# Patient Record
Sex: Male | Born: 1995 | Race: White | Hispanic: No | Marital: Single | Smoking: Never smoker
Health system: Southern US, Community
[De-identification: ages and names within clinical notes are randomized; demographics above are authoritative.]

## PROBLEM LIST (undated history)

## (undated) DIAGNOSIS — F909 Attention-deficit hyperactivity disorder, unspecified type: Secondary | ICD-10-CM

## (undated) DIAGNOSIS — E785 Hyperlipidemia, unspecified: Secondary | ICD-10-CM

## (undated) DIAGNOSIS — F913 Oppositional defiant disorder: Secondary | ICD-10-CM

## (undated) DIAGNOSIS — R079 Chest pain, unspecified: Secondary | ICD-10-CM

## (undated) HISTORY — DX: Oppositional defiant disorder: F91.3

## (undated) HISTORY — DX: Attention-deficit hyperactivity disorder, unspecified type: F90.9

## (undated) HISTORY — DX: Hyperlipidemia, unspecified: E78.5

---
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
## 2016-01-18 ENCOUNTER — Encounter: Payer: Self-pay | Admitting: Nurse Practitioner

## 2016-01-18 ENCOUNTER — Ambulatory Visit (INDEPENDENT_AMBULATORY_CARE_PROVIDER_SITE_OTHER): Payer: Medicaid Other | Admitting: Nurse Practitioner

## 2016-01-18 VITALS — BP 132/88 | HR 109 | Temp 97.1°F | Ht 66.0 in | Wt 214.0 lb

## 2016-01-18 DIAGNOSIS — F900 Attention-deficit hyperactivity disorder, predominantly inattentive type: Secondary | ICD-10-CM

## 2016-01-18 MED ORDER — METHYLPHENIDATE HCL ER (OSM) 54 MG PO TBCR: 54.0000 mg | EXTENDED_RELEASE_TABLET | ORAL | Status: DC

## 2016-01-18 NOTE — Progress Notes (Signed)
   Subjective:    Patient ID: Stephen Richardson, male    DOB: 1996-05-01, 20 y.o.   MRN: [id number]  HPI Patient brought in today by mom for follow up of ADHD. Currently taking concerta  daily. Behavior- good Grades- good Medication side effects- none Weight loss- none Sleeping habits- good- not taking clinidine nightly Any concerns- none     Review of Systems  Constitutional: Negative.   HENT: Negative.   Respiratory: Negative.   Cardiovascular: Negative.   Genitourinary: Negative.   Neurological: Negative.   Psychiatric/Behavioral: Negative.   All other systems reviewed and are negative.      Objective:   Physical Exam  Constitutional: He is oriented to person, place, and time. He appears well-developed and well-nourished.  Cardiovascular: Normal rate, regular rhythm and normal heart sounds.   Pulmonary/Chest: Effort normal and breath sounds normal.  Abdominal: Soft. Bowel sounds are normal.  Neurological: He is alert and oriented to person, place, and time.  Skin: Skin is warm.  Psychiatric: He has a normal mood and affect. His behavior is normal. Judgment and thought content normal.   BP 132/88 mmHg  Pulse 109  Temp(Src) 97.1 F (36.2 C) (Oral)  Ht  (1.676 m)  Wt 214 lb (97.07 kg)  BMI 34.56 kg/m2        Assessment & Plan:  1. ADD (attention deficit hyperactivity disorder, inattentive type) Meds as prescribed Behavior modification as needed Follow-up for recheck in 3 months - methylphenidate (CONCERTA) 54 MG PO CR tablet; Take 1 tablet (54 mg total) by mouth every morning.  Dispense: 30 tablet; Refill: 0 - methylphenidate (CONCERTA) 54 MG PO CR tablet; Take 1 tablet (54 mg total) by mouth every morning.  Dispense: 30 tablet; Refill: 0 - methylphenidate (CONCERTA) 54 MG PO CR tablet; Take 1 tablet (54 mg total) by mouth every morning.  Dispense: 30 tablet; Refill: 0   Mary-Margaret Daphine Deutscher, FNP

## 2016-02-23 ENCOUNTER — Encounter (HOSPITAL_COMMUNITY): Payer: Self-pay | Admitting: Emergency Medicine

## 2016-02-23 ENCOUNTER — Emergency Department (HOSPITAL_COMMUNITY): Payer: Medicaid Other

## 2016-02-23 ENCOUNTER — Emergency Department (HOSPITAL_COMMUNITY)
Admission: EM | Admit: 2016-02-23 | Discharge: 2016-02-24 | Disposition: A | Discharge status: Home / Self Care | Payer: Medicaid Other | Attending: Emergency Medicine | Admitting: Emergency Medicine

## 2016-02-23 DIAGNOSIS — Z79899 Other long term (current) drug therapy: Secondary | ICD-10-CM | POA: Diagnosis not present

## 2016-02-23 DIAGNOSIS — I10 Essential (primary) hypertension: Secondary | ICD-10-CM | POA: Insufficient documentation

## 2016-02-23 DIAGNOSIS — R079 Chest pain, unspecified: Secondary | ICD-10-CM

## 2016-02-23 DIAGNOSIS — R011 Cardiac murmur, unspecified: Secondary | ICD-10-CM | POA: Diagnosis not present

## 2016-02-23 DIAGNOSIS — F909 Attention-deficit hyperactivity disorder, unspecified type: Secondary | ICD-10-CM | POA: Insufficient documentation

## 2016-02-23 LAB — BASIC METABOLIC PANEL
Anion gap: 10 (ref 5–15)
BUN: 13 mg/dL (ref 6–20)
CHLORIDE: 104 mmol/L (ref 101–111)
CO2: 25 mmol/L (ref 22–32)
CREATININE: 0.58 mg/dL — AB (ref 0.61–1.24)
Calcium: 9.5 mg/dL (ref 8.9–10.3)
GFR calc Af Amer: >60 mL/min (ref >60)
GFR calc non Af Amer: >60 mL/min (ref >60)
Glucose, Bld: 91 mg/dL (ref 65–99)
POTASSIUM: 3.6 mmol/L (ref 3.5–5.1)
Sodium: 139 mmol/L (ref 135–145)

## 2016-02-23 LAB — CBC
HEMATOCRIT: 43.4 % (ref 39.0–52.0)
Hemoglobin: 14.4 g/dL (ref 13.0–17.0)
MCH: 29 pg (ref 26.0–34.0)
MCHC: 33.2 g/dL (ref 30.0–36.0)
MCV: 87.3 fL (ref 78.0–100.0)
PLATELETS: 365 10*3/uL (ref 150–400)
RBC: 4.97 MIL/uL (ref 4.22–5.81)
RDW: 13.3 % (ref 11.5–15.5)
WBC: 10.2 10*3/uL (ref 4.0–10.5)

## 2016-02-23 LAB — I-STAT TROPONIN, ED: Troponin i, poc: 0 ng/mL (ref 0.00–0.08)

## 2016-02-23 NOTE — ED Notes (Signed)
Per ems-- pt had sudden onset cp, dizziness, sob. Pt with hx of heart murmur. Pt htn with ems. Ems administered 324 asa with brought his pain down to 2/10. 20G IV in place in L AC.

## 2016-02-23 NOTE — ED Provider Notes (Signed)
CSN: [id number]     Arrival date & time 02/23/16  1704 History  By signing my name below, I, Freida Busman, attest that this documentation has been prepared under the direction and in the presence of non-physician practitioner, Melburn Hake, PA-C. Electronically Signed: Freida Busman, Scribe. 02/23/2016. 11:55 PM.    Chief Complaint  Patient presents with  . Chest Pain    The history is provided by the patient. No language interpreter was used.    HPI Comments:  Stephen Richardson is a 20 y.o. male who presents to the Emergency Department complaining of intermittent, left sided CP which began this afternoon while on the bus. He notes his pain radiates into his left shoulder. Pt describes the pain as a tightness. He reports associated  dizziness at time of onset but notes it has resolved. Pt was given 324 mg ASA via EMS with relief. He denies CP at this time. Pt reports h/o similar symptoms; reports h/o heart murmur and is currently being followed by cardiology at [facility]. Pt is not currently on meds for the CP.  He also denies fever, nausea, cough, congestion, rhinorrhea, numbness/tingling, SOB, palpitations, abdominal pain and weakness.  No recent change in ADD meds.   Past Medical History  Diagnosis Date  . ADD (attention deficit disorder with hyperactivity)   . ODD (oppositional defiant disorder)   . Chest pain on exertion    History reviewed. No pertinent past surgical history. Family History  Problem Relation Age of Onset  . Kidney disease Father   . Diabetes Father   . Pancreatitis Father    Social History  Substance Use Topics  . Smoking status: Never Smoker   . Smokeless tobacco: None  . Alcohol Use: No    Review of Systems  HENT: Negative for congestion and rhinorrhea.   Respiratory: Negative for cough.   Cardiovascular: Positive for chest pain.  Gastrointestinal: Negative for nausea and vomiting.  Neurological: Positive for dizziness (resolved). Negative for numbness.  All  other systems reviewed and are negative.  Allergies  Review of patient's allergies indicates no known allergies.  Home Medications   Prior to Admission medications   Medication Sig Start Date End Date Taking? Authorizing Provider  cloNIDine (CATAPRES) 0.2 MG tablet Take 1 tablet (0.2 mg total) by mouth daily. Patient not taking: Reported on 01/18/2016 07/02/14   Mary-Margaret Daphine Deutscher, FNP  methylphenidate (CONCERTA) 54 MG PO CR tablet Take 1 tablet (54 mg total) by mouth every morning. 01/18/16   Mary-Margaret Daphine Deutscher, FNP  methylphenidate (CONCERTA) 54 MG PO CR tablet Take 1 tablet (54 mg total) by mouth every morning. 01/18/16   Mary-Margaret Daphine Deutscher, FNP  methylphenidate (CONCERTA) 54 MG PO CR tablet Take 1 tablet (54 mg total) by mouth every morning. 01/18/16   Mary-Margaret Daphine Deutscher, FNP   BP 131/62 mmHg  Pulse 91  Temp(Src) 98.5 F (36.9 C) (Oral)  Resp 20  SpO2 100% Physical Exam  Constitutional: He is oriented to person, place, and time. He appears well-developed and well-nourished.  HENT:  Head: Normocephalic and atraumatic.  Mouth/Throat: Oropharynx is clear and moist. No oropharyngeal exudate.  Eyes: Conjunctivae and EOM are normal. Pupils are equal, round, and reactive to light. Right eye exhibits no discharge. Left eye exhibits no discharge. No scleral icterus.  Neck: Normal range of motion. Neck supple.  Cardiovascular: Normal rate, regular rhythm and intact distal pulses.   Murmur (2/6 systolic) heard. Pulmonary/Chest: Effort normal and breath sounds normal. No respiratory distress. He has no wheezes. He  has no rales. He exhibits no tenderness.  Abdominal: Soft. Bowel sounds are normal. He exhibits no distension and no mass. There is no tenderness. There is no rebound and no guarding.  Musculoskeletal: Normal range of motion. He exhibits no edema.  Neurological: He is alert and oriented to person, place, and time.  Skin: Skin is warm and dry.  Nursing note and vitals  reviewed.   ED Course  Procedures   DIAGNOSTIC STUDIES:  Oxygen Saturation is 96% on RA, normal by my interpretation.    COORDINATION OF CARE:  11:45 PM Pt updated with results. Discussed treatment plan with pt at bedside and pt agreed to plan.  Labs Review Labs Reviewed  BASIC METABOLIC PANEL - Abnormal; Notable for the following:    Creatinine, Ser 0.58 (*)    All other components within normal limits  CBC  I-STAT TROPOININ, ED  Rosezena SensorI-STAT TROPOININ, ED    Imaging Review Dg Chest 2 View  02/23/2016  CLINICAL DATA:  Chest pain and shortness of breath for 1 day. EXAM: CHEST  2 VIEW COMPARISON:  05/18/2013 FINDINGS: Midline trachea.  Normal heart size and mediastinal contours. Sharp costophrenic angles.  No pneumothorax.  Clear lungs. IMPRESSION: No active cardiopulmonary disease. Electronically Signed   By: Jeronimo GreavesKyle  Talbot M.D.   On: 02/23/2016 17:32   I have personally reviewed and evaluated these images and lab results as part of my medical decision-making.   EKG Interpretation   Date/Time:  Tuesday February 23 2016 17:05:29 EDT Ventricular Rate:  96 PR Interval:  162 QRS Duration: 96 QT Interval:  322 QTC Calculation: 406 R Axis:   -7 Text Interpretation:  Sinus rhythm with marked sinus arrhythmia Septal  infarct , age undetermined Abnormal ECG No significant change since last  tracing Confirmed by Bebe ShaggyWICKLINE  MD, DONALD ([id number]) on 02/23/2016 5:09:53 PM      MDM   Final diagnoses:  Chest pain, unspecified chest pain type    Patient presents with left sided chest tightness while he was riding a school bus home. EMS administered aspirin prior to arrival. Patient reports his pain has resolved since arrival to the ED. Mother endorses history of heart murmur and reports [facility] VSS. Exam revealed 2/6 systolic murmur, otherwise unremarkable.  Chart review shows pt diagnosed with mild congenital bicuspid aortic valve stenosis, HTN, mild aortic  valve insufficieny and mild ascending aorta dilation. Nml stress test performed on 12/02/13. [facility] and plan was to follow up in one year with repeat echo.   EKG showed sinus rhythm with marked sinus arrhythmia, no significant changes from prior. Troponin negative. Labs unremarkable. Chest x-ray negative. Delta troponin negative. HEART score <3. I have a low suspicion for ACS, PE, dissection, or other acute cardiac event at this time. Discussed results and plan for discharge with patient. Advised patient to follow up with his cardiologist in 1-2 weeks.  Evaluation does not show pathology requring ongoing emergent intervention or admission. Pt is hemodynamically stable and mentating appropriately. Discussed findings/results and plan with patient/guardian, who agrees with plan. All questions answered. Return precautions discussed and outpatient follow up given.     I personally performed the services described in this documentation, which was scribed in my presence. The recorded information has been reviewed and is accurate.    Satira Sarkicole Elizabeth ClintonNadeau, [place] JerseyPA-C 02/24/16 0153  Tomasita CrumbleAdeleke Oni, MD 02/24/16 [phone]

## 2016-02-24 LAB — I-STAT TROPONIN, ED: TROPONIN I, POC: 0 ng/mL (ref 0.00–0.08)

## 2016-02-24 NOTE — Discharge Instructions (Signed)
Continue taking your home medications as prescribed. Call your cardiologist's office tomorrow to schedule a follow-up appointment to be seen in the next 1-2 weeks. Return to the ED if your symptoms worsen or new onset of fever, difficulty breathing, cough, coughing up blood, chest pain, abdominal pain, vomiting, lightheadedness, dizziness, syncope, seizure.

## 2016-04-14 ENCOUNTER — Ambulatory Visit (INDEPENDENT_AMBULATORY_CARE_PROVIDER_SITE_OTHER): Payer: Medicaid Other | Admitting: Nurse Practitioner

## 2016-04-14 ENCOUNTER — Encounter: Payer: Self-pay | Admitting: Nurse Practitioner

## 2016-04-14 VITALS — BP 112/88 | HR 106 | Temp 97.4°F | Ht 66.05 in | Wt 220.8 lb

## 2016-04-14 DIAGNOSIS — F900 Attention-deficit hyperactivity disorder, predominantly inattentive type: Secondary | ICD-10-CM | POA: Diagnosis not present

## 2016-04-14 MED ORDER — METHYLPHENIDATE HCL ER (OSM) 54 MG PO TBCR: 54.0000 mg | EXTENDED_RELEASE_TABLET | ORAL | Status: DC

## 2016-04-14 NOTE — Progress Notes (Signed)
   Subjective:    Patient ID: Stephen Richardson, male    DOB: 11/01/1996, 20 y.o.   MRN: [id number]  HPI Patient brought in today by Mom for follow up of ADHD. Currently taking Concerta 54 daily. Behavior-good Grades- good- set to graduate next year Medication side effects- none Weight loss- none Sleeping habits- good Any concerns- none     Review of Systems  Constitutional: Negative.   HENT: Negative.   Respiratory: Negative.   Cardiovascular: Negative.   Neurological: Negative.   Psychiatric/Behavioral: Negative.   All other systems reviewed and are negative.      Objective:   Physical Exam  Constitutional: He is oriented to person, place, and time. He appears well-developed and well-nourished. He appears distressed.  Cardiovascular: Normal rate, regular rhythm and normal heart sounds.   Pulmonary/Chest: Effort normal.  Neurological: He is alert and oriented to person, place, and time.  Skin: Skin is warm.  Psychiatric: He has a normal mood and affect. His behavior is normal. Judgment and thought content normal.   BP 112/88 mmHg  Pulse 106  Temp(Src) 97.4 F (36.3 C) (Oral)  Ht 5' 6.05" (1.678 m)  Wt 220 lb 12.8 oz (100.154 kg)  BMI 35.57 kg/m2        Assessment & Plan:   1. ADD (attention deficit hyperactivity disorder, inattentive type)    Meds ordered this encounter  Medications  . methylphenidate (CONCERTA) 54 MG PO CR tablet    Sig: Take 1 tablet (54 mg total) by mouth every morning.    Dispense:  30 tablet    Refill:  0    Order Specific Question:  Supervising Provider    Answer:  Ernestina PennaMOORE, DONALD W [[id number]]  . methylphenidate (CONCERTA) 54 MG PO CR tablet    Sig: Take 1 tablet (54 mg total) by mouth every morning.    Dispense:  30 tablet    Refill:  0    DO NOT FILL TILL 05/14/16    Order Specific Question:  Supervising Provider    Answer:  Ernestina PennaMOORE, DONALD W [[id number]]  . methylphenidate (CONCERTA) 54 MG PO CR tablet    Sig: Take 1 tablet (54 mg total) by  mouth every morning.    Dispense:  30 tablet    Refill:  0    DO NOT FILL TILL 06/12/16    Order Specific Question:  Supervising Provider    Answer:  Ernestina PennaMOORE, DONALD W [[id number]]   Meds as prescribed Behavior modification as needed Follow-up for recheck in 3 months  [name], FNP

## 2016-04-26 ENCOUNTER — Encounter (HOSPITAL_COMMUNITY): Payer: Self-pay | Admitting: Nurse Practitioner

## 2016-04-26 ENCOUNTER — Emergency Department (HOSPITAL_COMMUNITY): Payer: Medicaid Other

## 2016-04-26 ENCOUNTER — Emergency Department (HOSPITAL_COMMUNITY)
Admission: EM | Admit: 2016-04-26 | Discharge: 2016-04-27 | Disposition: A | Discharge status: Home / Self Care | Payer: Medicaid Other | Attending: Emergency Medicine | Admitting: Emergency Medicine

## 2016-04-26 DIAGNOSIS — F913 Oppositional defiant disorder: Secondary | ICD-10-CM | POA: Insufficient documentation

## 2016-04-26 DIAGNOSIS — R079 Chest pain, unspecified: Secondary | ICD-10-CM | POA: Diagnosis present

## 2016-04-26 DIAGNOSIS — F909 Attention-deficit hyperactivity disorder, unspecified type: Secondary | ICD-10-CM | POA: Insufficient documentation

## 2016-04-26 DIAGNOSIS — R011 Cardiac murmur, unspecified: Secondary | ICD-10-CM | POA: Diagnosis not present

## 2016-04-26 LAB — CBC
HCT: 45 % (ref 39.0–52.0)
Hemoglobin: 15.3 g/dL (ref 13.0–17.0)
MCH: 29.7 pg (ref 26.0–34.0)
MCHC: 34 g/dL (ref 30.0–36.0)
MCV: 87.4 fL (ref 78.0–100.0)
Platelets: 378 10*3/uL (ref 150–400)
RBC: 5.15 MIL/uL (ref 4.22–5.81)
RDW: 13.3 % (ref 11.5–15.5)
WBC: 11.3 10*3/uL — ABNORMAL HIGH (ref 4.0–10.5)

## 2016-04-26 LAB — BASIC METABOLIC PANEL
Anion gap: 10 (ref 5–15)
BUN: 17 mg/dL (ref 6–20)
CO2: 25 mmol/L (ref 22–32)
Calcium: 9.4 mg/dL (ref 8.9–10.3)
Chloride: 104 mmol/L (ref 101–111)
Creatinine, Ser: 0.59 mg/dL — ABNORMAL LOW (ref 0.61–1.24)
GFR calc Af Amer: >60 mL/min (ref >60)
GFR calc non Af Amer: >60 mL/min (ref >60)
Glucose, Bld: 96 mg/dL (ref 65–99)
Potassium: 3.9 mmol/L (ref 3.5–5.1)
Sodium: 139 mmol/L (ref 135–145)

## 2016-04-26 LAB — I-STAT TROPONIN, ED: TROPONIN I, POC: 0 ng/mL (ref 0.00–0.08)

## 2016-04-26 NOTE — ED Notes (Signed)
EDP at bedside  

## 2016-04-26 NOTE — Discharge Instructions (Signed)
Stephen Richardson needs to follow up with his Cardiologist for recheck.  He should not participate in sports until he is seen by the Cardiologist and cleared for activity.  Get him rechecked immediately if you has any more chest pain or new worrisome symptoms.     Chest Pain,  Chest pain is an uncomfortable, tight, or painful feeling in the chest. Chest pain may go away on its own and is usually not dangerous.  CAUSES Common causes of chest pain include:   Receiving a direct blow to the chest.   A pulled muscle (strain).  Muscle cramping.   A pinched nerve.   A lung infection (pneumonia).   Asthma.   Coughing.  Stress.  Acid reflux. HOME CARE INSTRUCTIONS   Have your child avoid physical activity if it causes pain.  Have you child avoid lifting heavy objects.  If directed by your child's caregiver, put ice on the injured area.  Put ice in a plastic bag.  Place a towel between your child's skin and the bag.  Leave the ice on for 15-20 minutes, 03-04 times a day.  Only give your child over-the-counter or prescription medicines as directed by his or her caregiver.   Give your child antibiotic medicine as directed. Make sure your child finishes it even if he or she starts to feel better. SEEK IMMEDIATE MEDICAL CARE IF:  Your child's chest pain becomes severe and radiates into the neck, arms, or jaw.   Your child has difficulty breathing.   Your child's heart starts to beat fast while he or she is at rest.   Your child who is younger than 3 months has a fever.  Your child who is older than 3 months has a fever and persistent symptoms.  Your child who is older than 3 months has a fever and symptoms suddenly get worse.  Your child faints.   Your child coughs up blood.   Your child coughs up phlegm that appears pus-like (sputum).   Your child's chest pain worsens. MAKE SURE YOU:  Understand these instructions.  Will watch your condition.  Will get help  right away if you are not doing well or get worse.   This information is not intended to replace advice given to you by your health care provider. Make sure you discuss any questions you have with your health care provider.   Document Released: 02/15/2007 Document Revised: 11/14/2012 Document Reviewed: 07/24/2012 Elsevier Interactive Patient Education [employer]2016 Elsevier Inc.

## 2016-04-26 NOTE — ED Notes (Signed)
Pt states he was riding on school bus when he began to have CP and a headache. Staff states the patient seemed lethargic and pale at the time. Ems was called, they gave the pt 500cc NS and the pt has felt better since. He denies any pain now. He is alert and breathing easily.

## 2016-04-26 NOTE — ED Provider Notes (Signed)
CSN: [id number]     Arrival date & time 04/26/16  1730 History  By signing my name below, I, Bethel BornBritney McCollum, attest that this documentation has been prepared under the direction and in the presence of Tilden FossaElizabeth Malijah Lietz, MD. Electronically Signed: Bethel BornBritney McCollum, ED Scribe. 04/27/2016. 12:02 AM   Chief Complaint  Patient presents with  . Chest Pain   The history is provided by the patient, a parent and medical records (Reviewed cardiology records and Care Everywhere). No language interpreter was used.   Stephen Richardson is a 20 y.o. male who presents to the Emergency Department with his mother complaining of intermittent, heavy "like someone sat on me", central chest pain with onset approximately 8 hours ago on the school bus. The pain had no modifying factors and he is pain free at present. His mother notes that he had a similar episode 3 months ago and was seen by cardiology where he was noted to have aortic stenosis and a dilated aorta.  Associated symptoms include a headache that has resolved. He states that while he was on the bus he kept falling in and out of sleep which is new for him. Pt denies fever, cough, sweating, nausea, vomiting, SOB, and LE swelling. He has not been ill recently. Pt denies taking any medication today.   Past Medical History  Diagnosis Date  . ADD (attention deficit disorder with hyperactivity)   . ODD (oppositional defiant disorder)   . Chest pain on exertion    History reviewed. No pertinent past surgical history. Family History  Problem Relation Age of Onset  . Kidney disease Father   . Diabetes Father   . Pancreatitis Father    Social History  Substance Use Topics  . Smoking status: Never Smoker   . Smokeless tobacco: None  . Alcohol Use: No    Review of Systems  All other systems reviewed and are negative.     Allergies  Review of patient's allergies indicates no known allergies.  Home Medications   Prior to Admission medications    Medication Sig Start Date End Date Taking? Authorizing Provider  ibuprofen (ADVIL,MOTRIN) 400 MG tablet Take 400 mg by mouth every 6 (six) hours as needed for mild pain.   Yes Historical Provider, MD  methylphenidate (CONCERTA) 54 MG PO CR tablet Take 1 tablet (54 mg total) by mouth every morning. 04/14/16  Yes [name], FNP  cloNIDine (CATAPRES) 0.2 MG tablet Take 1 tablet (0.2 mg total) by mouth daily. Patient not taking: Reported on 01/18/2016 07/02/14   [name], FNP  methylphenidate (CONCERTA) 54 MG PO CR tablet Take 1 tablet (54 mg total) by mouth every morning. 04/14/16   [name], FNP  methylphenidate (CONCERTA) 54 MG PO CR tablet Take 1 tablet (54 mg total) by mouth every morning. 04/14/16   [name], FNP   BP 131/73 mmHg  Pulse 88  Temp(Src) 98.2 F (36.8 C) (Oral)  Resp 21  SpO2 97% Physical Exam  Constitutional: He is oriented to person, place, and time. He appears well-developed and well-nourished.  HENT:  Head: Normocephalic and atraumatic.  Cardiovascular: Normal rate and regular rhythm.   Murmur heard. Systolic ejection murmur  Pulmonary/Chest: Effort normal and breath sounds normal. No respiratory distress.  Abdominal: Soft. There is no tenderness. There is no rebound and no guarding.  Musculoskeletal: He exhibits no edema or tenderness.  Neurological: He is alert and oriented to person, place, and time.  Skin: Skin is warm and dry.  Psychiatric: He  has a normal mood and affect. His behavior is normal.  Nursing note and vitals reviewed.   ED Course  Procedures (including critical care time) DIAGNOSTIC STUDIES: Oxygen Saturation is 97% on RA,  normal by my interpretation.    COORDINATION OF CARE: 11:15 PM Discussed treatment plan which includes lab work, CXR, EKG with pt at bedside and pt agreed to plan.  12:01 AM Pt orthostatic. Will provide IVF bolus.   Labs Review Labs Reviewed  BASIC METABOLIC PANEL - Abnormal;  Notable for the following:    Creatinine, Ser 0.59 (*)    All other components within normal limits  CBC - Abnormal; Notable for the following:    WBC 11.3 (*)    All other components within normal limits  I-STAT TROPOININ, ED    Imaging Review Dg Chest 2 View  04/26/2016  CLINICAL DATA:  Chest pain and shortness of breath for 4 months, worse tonight. EXAM: CHEST  2 VIEW COMPARISON:  02/23/2016 FINDINGS: The heart size and mediastinal contours are within normal limits. Both lungs are clear. The visualized skeletal structures are unremarkable. IMPRESSION: No active cardiopulmonary disease. Electronically Signed   By: Burman Nieves M.D.   On: 04/26/2016 22:52   I have personally reviewed and evaluated these images and lab results as part of my medical decision-making.   EKG Interpretation   Date/Time:  Tuesday Apr 26 2016 17:43:19 EDT Ventricular Rate:  101 PR Interval:  162 QRS Duration: 92 QT Interval:  320 QTC Calculation: 414 R Axis:   -17 Text Interpretation:  Sinus tachycardia Anteroseptal infarct , age  undetermined Abnormal ECG Confirmed by Bebe Shaggy  MD, DONALD ([id number]) on  04/26/2016 9:33:29 PM Also confirmed by Lincoln Brigham [phone])  on 04/26/2016  11:04:02 PM      MDM   Final diagnoses:  Chest pain, unspecified chest pain type   Pt here for evaluation of chest pain, resolved in evaluation in the ED.  He was slightly orthostatic in the department - improved after IVF administration.  Presentation not c/w PE, CHF, ACS, life threatening arrhythmia.  Plan to d/c home with close outpatient Cardiology follow up.  Discussed no heavy physical activity until evaluated by Cardiology.   I personally performed the services described in this documentation, which was scribed in my presence. The recorded information has been reviewed and is accurate.    Tilden Fossa, MD 04/27/16 [phone]

## 2016-04-27 MED ORDER — SODIUM CHLORIDE 0.9 % IV BOLUS (SEPSIS)
1000.0000 mL | Freq: Once | INTRAVENOUS | Status: AC
  Administered 2016-04-27: 1000 mL via INTRAVENOUS

## 2016-04-27 NOTE — ED Notes (Signed)
See downtime sheet

## 2016-07-19 ENCOUNTER — Encounter: Payer: Self-pay | Admitting: Nurse Practitioner

## 2016-07-19 ENCOUNTER — Ambulatory Visit (INDEPENDENT_AMBULATORY_CARE_PROVIDER_SITE_OTHER): Payer: Medicaid Other | Admitting: Nurse Practitioner

## 2016-07-19 DIAGNOSIS — F900 Attention-deficit hyperactivity disorder, predominantly inattentive type: Secondary | ICD-10-CM

## 2016-07-19 MED ORDER — METHYLPHENIDATE HCL ER (OSM) 54 MG PO TBCR: 54.0000 mg | EXTENDED_RELEASE_TABLET | ORAL | 0 refills | Status: DC

## 2016-07-19 NOTE — Progress Notes (Signed)
   Subjective:    Patient ID: Stephen LeitzJohnny Richardson, male    DOB: 03/03/1996, 20 y.o.   MRN: [id number]  HPI Patient brought in today by mom for follow up of ADHD. Currently taking concerta 54mg  daily. 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   HENT: Negative.   Respiratory: Negative.   Cardiovascular: Negative.   Genitourinary: Negative.   Neurological: Negative.   Psychiatric/Behavioral: Negative.   All other systems reviewed and are negative.      Objective:   Physical Exam  Constitutional: He is oriented to person, place, and time. He appears well-developed and well-nourished.  Cardiovascular: Normal rate, regular rhythm and normal heart sounds.   Pulmonary/Chest: Effort normal and breath sounds normal.  Neurological: He is alert and oriented to person, place, and time.  Skin: Skin is warm.  Psychiatric: He has a normal mood and affect. His behavior is normal. Judgment and thought content normal.   BP 130/67 (BP Location: Right Arm, Patient Position: Sitting, Cuff Size: Large)   Pulse (!) 102   Temp 97.8 F (36.6 C)   Ht 5\' 6"  (1.676 m)   Wt 222 lb (100.7 kg)   BMI 35.83 kg/m         Assessment & Plan:  1. ADD (attention deficit hyperactivity disorder, inattentive type) Continue behavior modification Follow up on 3 months - methylphenidate (CONCERTA) 54 MG PO CR tablet; Take 1 tablet (54 mg total) by mouth every morning.  Dispense: 30 tablet; Refill: 0 - methylphenidate (CONCERTA) 54 MG PO CR tablet; Take 1 tablet (54 mg total) by mouth every morning.  Dispense: 30 tablet; Refill: 0 - methylphenidate (CONCERTA) 54 MG PO CR tablet; Take 1 tablet (54 mg total) by mouth every  morning.  Dispense: 30 tablet; Refill: 0   [name], FNP

## 2016-10-18 ENCOUNTER — Telehealth: Payer: Self-pay

## 2016-10-18 MED ORDER — LISDEXAMFETAMINE DIMESYLATE 50 MG PO CAPS: 50.0000 mg | ORAL_CAPSULE | Freq: Every day | ORAL | 0 refills | Status: DC

## 2016-10-18 NOTE — Telephone Encounter (Signed)
Patients mother aware  

## 2016-10-19 ENCOUNTER — Ambulatory Visit: Payer: Medicaid Other | Admitting: Nurse Practitioner

## 2016-10-19 ENCOUNTER — Telehealth: Payer: Self-pay | Admitting: Nurse Practitioner

## 2016-10-19 NOTE — Telephone Encounter (Signed)
Mom is going to keep him out of school tomorrow and see if vyvanse does the same thing tomottow. She will call and let me know.

## 2016-11-17 ENCOUNTER — Ambulatory Visit (INDEPENDENT_AMBULATORY_CARE_PROVIDER_SITE_OTHER): Payer: Medicaid Other | Admitting: Nurse Practitioner

## 2016-11-17 ENCOUNTER — Encounter: Payer: Self-pay | Admitting: Nurse Practitioner

## 2016-11-17 VITALS — BP 130/83 | HR 96 | Temp 97.1°F | Ht 65.5 in | Wt 225.0 lb

## 2016-11-17 DIAGNOSIS — F900 Attention-deficit hyperactivity disorder, predominantly inattentive type: Secondary | ICD-10-CM

## 2016-11-17 MED ORDER — LISDEXAMFETAMINE DIMESYLATE 50 MG PO CAPS: 50.0000 mg | ORAL_CAPSULE | Freq: Every day | ORAL | 0 refills | Status: DC

## 2016-11-17 MED ORDER — LISDEXAMFETAMINE DIMESYLATE 50 MG PO CAPS
50.0000 mg | ORAL_CAPSULE | Freq: Every day | ORAL | 0 refills | Status: DC
Start: 2016-11-17 — End: 2017-02-17

## 2016-11-17 NOTE — Progress Notes (Signed)
   Subjective:    Patient ID: Stephen Richardson, male    DOB: 09/19/1996, 20 y.o.   MRN: [id number]  HPI Patient comes in today for ADHD follow up- he is currently on vyvanse 50mg  daily. He was on concerta for years which worked well but insurance stopped paying for it, so we switched him to vyvanse. Does not like as well but is working without side effects He is doing well- He saw his cardiologist in October and was doing well. He takes clonidine.  To sleep which works well for him.    Review of Systems  Constitutional: Negative.   HENT: Negative.   Respiratory: Negative.   Cardiovascular: Negative.   Gastrointestinal: Negative.   Genitourinary: Negative.   Neurological: Negative.   Psychiatric/Behavioral: Negative.   All other systems reviewed and are negative.      Objective:   Physical Exam  Constitutional: He is oriented to person, place, and time. He appears well-developed and well-nourished. No distress.  Cardiovascular: Normal rate, regular rhythm and normal heart sounds.   Pulmonary/Chest: Effort normal and breath sounds normal.  Neurological: He is alert and oriented to person, place, and time.  Skin: Skin is warm.  Psychiatric: He has a normal mood and affect. His behavior is normal. Judgment and thought content normal.   BP 130/83   Pulse 96   Temp 97.1 F (36.2 C) (Oral)   Ht 5' 5.5" (1.664 m)   Wt 225 lb (102.1 kg)   BMI 36.87 kg/m      Assessment & Plan:   1. Attention deficit hyperactivity disorder (ADHD), predominantly inattentive type    Meds ordered this encounter  Medications  . lisdexamfetamine (VYVANSE) 50 MG capsule    Sig: Take 1 capsule (50 mg total) by mouth daily.    Dispense:  30 capsule    Refill:  0    Order Specific Question:   Supervising Provider    Answer:   VINCENT, CAROL L [[id number]]  . lisdexamfetamine (VYVANSE) 50 MG capsule    Sig: Take 1 capsule (50 mg total) by mouth daily.    Dispense:  30 capsule    Refill:  0    DO NOT FILL  TILL 12/17/16    Order Specific Question:   Supervising Provider    Answer:   Rex KrasVINCENT, CAROL L [[id number]]  . lisdexamfetamine (VYVANSE) 50 MG capsule    Sig: Take 1 capsule (50 mg total) by mouth daily.    Dispense:  30 capsule    Refill:  0    DO NOT FILL TILL 01/16/17    Order Specific Question:   Supervising Provider    Answer:   Johna SheriffVINCENT, CAROL L [[id number]]   Continue behavior modification RTO  In 3 months  [name], FNP

## 2017-02-17 ENCOUNTER — Ambulatory Visit (INDEPENDENT_AMBULATORY_CARE_PROVIDER_SITE_OTHER): Payer: Medicaid Other | Admitting: Nurse Practitioner

## 2017-02-17 ENCOUNTER — Encounter: Payer: Self-pay | Admitting: Nurse Practitioner

## 2017-02-17 VITALS — BP 137/87 | HR 85 | Temp 97.6°F | Ht 66.0 in | Wt 227.0 lb

## 2017-02-17 DIAGNOSIS — F19982 Other psychoactive substance use, unspecified with psychoactive substance-induced sleep disorder: Secondary | ICD-10-CM

## 2017-02-17 DIAGNOSIS — I10 Essential (primary) hypertension: Secondary | ICD-10-CM

## 2017-02-17 DIAGNOSIS — F900 Attention-deficit hyperactivity disorder, predominantly inattentive type: Secondary | ICD-10-CM

## 2017-02-17 MED ORDER — LISDEXAMFETAMINE DIMESYLATE 50 MG PO CAPS: 50.0000 mg | ORAL_CAPSULE | Freq: Every day | ORAL | 0 refills | Status: DC

## 2017-02-17 MED ORDER — LISDEXAMFETAMINE DIMESYLATE 50 MG PO CAPS
50.0000 mg | ORAL_CAPSULE | Freq: Every day | ORAL | 0 refills | Status: DC
Start: 2017-02-17 — End: 2017-05-19

## 2017-02-17 MED ORDER — CLONIDINE HCL 0.2 MG PO TABS: 0.2000 mg | ORAL_TABLET | Freq: Every day | ORAL | 3 refills | Status: DC

## 2017-02-17 MED ORDER — LISINOPRIL 5 MG PO TABS: 5.0000 mg | ORAL_TABLET | Freq: Every day | ORAL | 5 refills | Status: DC

## 2017-02-17 NOTE — Progress Notes (Signed)
   Subjective:    Patient ID: Stephen Richardson, male    DOB: 02/06/1996, 21 y.o.   MRN: [id number]  HPI ADHD- Patient comes in today for ADHD follow up- he is currently on vyvanse 50mg  daily. He likes concerta that he was on better but medicaid stopped paying for it. He is doing well on vyvanse without sie effects. I ask him if he would like to switch back to concerta now that medicaid is covering it and he said no. He has gained weight since switch ing from concerta Hypertension- on lisinopril-  No side effects from  Medication- denies HA,chest pain and SOB Insomnia- on clonidine which helps him sleep well.  Review of Systems  Constitutional: Negative.   HENT: Negative.   Respiratory: Negative.   Cardiovascular: Negative.   Gastrointestinal: Negative.   Genitourinary: Negative.   Neurological: Negative.   Psychiatric/Behavioral: Negative.   All other systems reviewed and are negative.      Objective:   Physical Exam  Constitutional: He is oriented to person, place, and time. He appears well-developed and well-nourished. No distress.  Cardiovascular: Normal rate, regular rhythm and normal heart sounds.   Pulmonary/Chest: Effort normal and breath sounds normal.  Neurological: He is alert and oriented to person, place, and time.  Skin: Skin is warm.  Psychiatric: He has a normal mood and affect. His behavior is normal. Judgment and thought content normal.   BP 137/87   Pulse 85   Temp 97.6 F (36.4 C) (Oral)   Ht 5\' 6"  (1.676 m)   Wt 227 lb (103 kg)   BMI 36.64 kg/m      Assessment & Plan:

## 2017-05-19 ENCOUNTER — Encounter: Payer: Self-pay | Admitting: Nurse Practitioner

## 2017-05-19 ENCOUNTER — Ambulatory Visit (INDEPENDENT_AMBULATORY_CARE_PROVIDER_SITE_OTHER): Payer: Medicaid Other | Admitting: Nurse Practitioner

## 2017-05-19 VITALS — BP 129/84 | HR 118 | Temp 98.5°F | Ht 66.02 in | Wt 224.4 lb

## 2017-05-19 DIAGNOSIS — F900 Attention-deficit hyperactivity disorder, predominantly inattentive type: Secondary | ICD-10-CM

## 2017-05-19 DIAGNOSIS — I10 Essential (primary) hypertension: Secondary | ICD-10-CM

## 2017-05-19 DIAGNOSIS — F19982 Other psychoactive substance use, unspecified with psychoactive substance-induced sleep disorder: Secondary | ICD-10-CM | POA: Diagnosis not present

## 2017-05-19 MED ORDER — LISDEXAMFETAMINE DIMESYLATE 50 MG PO CAPS: 50.0000 mg | ORAL_CAPSULE | Freq: Every day | ORAL | 0 refills | Status: DC

## 2017-05-19 MED ORDER — CLONIDINE HCL 0.2 MG PO TABS: 0.2000 mg | ORAL_TABLET | Freq: Every day | ORAL | 3 refills | Status: DC

## 2017-05-19 NOTE — Progress Notes (Signed)
   Subjective:    Patient ID: Stephen Richardson, male    DOB: 01/24/1996, 21 y.o.   MRN: [id number]  HPI  Stephen Richardson is here today for follow up of chronic medical problem.  Outpatient Encounter Prescriptions as of 05/19/2017  Medication Sig  . cloNIDine (CATAPRES) 0.2 MG tablet Take 1 tablet (0.2 mg total) by mouth daily.  Marland Kitchen. ibuprofen (ADVIL,MOTRIN) 400 MG tablet Take 400 mg by mouth every 6 (six) hours as needed for mild pain.  Marland Kitchen. lisdexamfetamine (VYVANSE) 50 MG capsule Take 1 capsule (50 mg total) by mouth daily.  Marland Kitchen. lisdexamfetamine (VYVANSE) 50 MG capsule Take 1 capsule (50 mg total) by mouth daily.  Marland Kitchen. lisdexamfetamine (VYVANSE) 50 MG capsule Take 1 capsule (50 mg total) by mouth daily.  Marland Kitchen. lisinopril (PRINIVIL,ZESTRIL) 5 MG tablet Take 1 tablet (5 mg total) by mouth daily.   No facility-administered encounter medications on file as of 05/19/2017.     1. Essential hypertension  No c/o chest pain,SOB or HA- he does not check blood pressure at home  2. Attention deficit hyperactivity disorder (ADHD), predominantly inattentive type Is currently on vyvanse 50mg  daily- he is dong well on current dose- helps hi to concentrate   3. Drug induced insomnia (HCC)  has to take clonidine to sleep at night- but seems to work for him    New complaints: None today     Review of Systems  Constitutional: Negative.   Respiratory: Negative.   Cardiovascular: Negative.   Gastrointestinal: Negative.   Genitourinary: Negative.   Neurological: Negative.   Psychiatric/Behavioral: Negative.        Objective:   Physical Exam  Constitutional: He appears well-developed and well-nourished. No distress.  Cardiovascular: Normal rate and regular rhythm.   Pulmonary/Chest: Breath sounds normal.  Skin: Skin is warm.  Psychiatric: He has a normal mood and affect. His behavior is normal. Judgment and thought content normal.    BP 129/84   Pulse (!) 118   Temp 98.5 F (36.9 C) (Oral)   Ht 5' 6.02"  (1.677 m)   Wt 224 lb 6.4 oz (101.8 kg)   BMI 36.20 kg/m        Assessment & Plan:  1. Essential hypertension Please yake blood pressure med as rx  2. Attention deficit hyperactivity disorder (ADHD), predominantly inattentive type Stress management - lisdexamfetamine (VYVANSE) 50 MG capsule; Take 1 capsule (50 mg total) by mouth daily.  Dispense: 30 capsule; Refill: 0 - lisdexamfetamine (VYVANSE) 50 MG capsule; Take 1 capsule (50 mg total) by mouth daily.  Dispense: 30 capsule; Refill: 0 - lisdexamfetamine (VYVANSE) 50 MG capsule; Take 1 capsule (50 mg total) by mouth daily.  Dispense: 30 capsule; Refill: 0  3. Drug induced insomnia (HCC) Bedtime routine - cloNIDine (CATAPRES) 0.2 MG tablet; Take 1 tablet (0.2 mg total) by mouth daily.  Dispense: 30 tablet; Refill: 3   [name], FNP

## 2017-05-19 NOTE — Patient Instructions (Signed)
DASH Eating Plan DASH stands for "Dietary Approaches to Stop Hypertension." The DASH eating plan is a healthy eating plan that has been shown to reduce high blood pressure (hypertension). It may also reduce your risk for type 2 diabetes, heart disease, and stroke. The DASH eating plan may also help with weight loss. What are tips for following this plan? General guidelines  Avoid eating more than 2,300 mg (milligrams) of salt (sodium) a day. If you have hypertension, you may need to reduce your sodium intake to 1,500 mg a day.  Limit alcohol intake to no more than 1 drink a day for nonpregnant women and 2 drinks a day for men. One drink equals 12 oz of beer, 5 oz of wine, or 1 oz of hard liquor.  Work with your health care provider to maintain a healthy body weight or to lose weight. Ask what an ideal weight is for you.  Get at least 30 minutes of exercise that causes your heart to beat faster (aerobic exercise) most days of the week. Activities may include walking, swimming, or biking.  Work with your health care provider or diet and nutrition specialist (dietitian) to adjust your eating plan to your individual calorie needs. Reading food labels  Check food labels for the amount of sodium per serving. Choose foods with less than 5 percent of the Daily Value of sodium. Generally, foods with less than 300 mg of sodium per serving fit into this eating plan.  To find whole grains, look for the word "whole" as the first word in the ingredient list. Shopping  Buy products labeled as "low-sodium" or "no salt added."  Buy fresh foods. Avoid canned foods and premade or frozen meals. Cooking  Avoid adding salt when cooking. Use salt-free seasonings or herbs instead of table salt or sea salt. Check with your health care provider or pharmacist before using salt substitutes.  Do not fry foods. Cook foods using healthy methods such as baking, boiling, grilling, and broiling instead.  Cook with  heart-healthy oils, such as olive, canola, soybean, or sunflower oil. Meal planning   Eat a balanced diet that includes: ? 5 or more servings of fruits and vegetables each day. At each meal, try to fill half of your plate with fruits and vegetables. ? Up to 6-8 servings of whole grains each day. ? Less than 6 oz of lean meat, poultry, or fish each day. A 3-oz serving of meat is about the same size as a deck of cards. One egg equals 1 oz. ? 2 servings of low-fat dairy each day. ? A serving of nuts, seeds, or beans 5 times each week. ? Heart-healthy fats. Healthy fats called Omega-3 fatty acids are found in foods such as flaxseeds and coldwater fish, like sardines, salmon, and mackerel.  Limit how much you eat of the following: ? Canned or prepackaged foods. ? Food that is high in trans fat, such as fried foods. ? Food that is high in saturated fat, such as fatty meat. ? Sweets, desserts, sugary drinks, and other foods with added sugar. ? Full-fat dairy products.  Do not salt foods before eating.  Try to eat at least 2 vegetarian meals each week.  Eat more home-cooked food and less restaurant, buffet, and fast food.  When eating at a restaurant, ask that your food be prepared with less salt or no salt, if possible. What foods are recommended? The items listed may not be a complete list. Talk with your dietitian about what   dietary choices are best for you. Grains Whole-grain or whole-wheat bread. Whole-grain or whole-wheat pasta. Brown rice. Oatmeal. Quinoa. Bulgur. Whole-grain and low-sodium cereals. Pita bread. Low-fat, low-sodium crackers. Whole-wheat flour tortillas. Vegetables Fresh or frozen vegetables (raw, steamed, roasted, or grilled). Low-sodium or reduced-sodium tomato and vegetable juice. Low-sodium or reduced-sodium tomato sauce and tomato paste. Low-sodium or reduced-sodium canned vegetables. Fruits All fresh, dried, or frozen fruit. Canned fruit in natural juice (without  added sugar). Meat and other protein foods Skinless chicken or turkey. Ground chicken or turkey. Pork with fat trimmed off. Fish and seafood. Egg whites. Dried beans, peas, or lentils. Unsalted nuts, nut butters, and seeds. Unsalted canned beans. Lean cuts of beef with fat trimmed off. Low-sodium, lean deli meat. Dairy Low-fat (1%) or fat-free (skim) milk. Fat-free, low-fat, or reduced-fat cheeses. Nonfat, low-sodium ricotta or cottage cheese. Low-fat or nonfat yogurt. Low-fat, low-sodium cheese. Fats and oils Soft margarine without trans fats. Vegetable oil. Low-fat, reduced-fat, or light mayonnaise and salad dressings (reduced-sodium). Canola, safflower, olive, soybean, and sunflower oils. Avocado. Seasoning and other foods Herbs. Spices. Seasoning mixes without salt. Unsalted popcorn and pretzels. Fat-free sweets. What foods are not recommended? The items listed may not be a complete list. Talk with your dietitian about what dietary choices are best for you. Grains Baked goods made with fat, such as croissants, muffins, or some breads. Dry pasta or rice meal packs. Vegetables Creamed or fried vegetables. Vegetables in a cheese sauce. Regular canned vegetables (not low-sodium or reduced-sodium). Regular canned tomato sauce and paste (not low-sodium or reduced-sodium). Regular tomato and vegetable juice (not low-sodium or reduced-sodium). Pickles. Olives. Fruits Canned fruit in a light or heavy syrup. Fried fruit. Fruit in cream or butter sauce. Meat and other protein foods Fatty cuts of meat. Ribs. Fried meat. Bacon. Sausage. Bologna and other processed lunch meats. Salami. Fatback. Hotdogs. Bratwurst. Salted nuts and seeds. Canned beans with added salt. Canned or smoked fish. Whole eggs or egg yolks. Chicken or turkey with skin. Dairy Whole or 2% milk, cream, and half-and-half. Whole or full-fat cream cheese. Whole-fat or sweetened yogurt. Full-fat cheese. Nondairy creamers. Whipped toppings.  Processed cheese and cheese spreads. Fats and oils Butter. Stick margarine. Lard. Shortening. Ghee. Bacon fat. Tropical oils, such as coconut, palm kernel, or palm oil. Seasoning and other foods Salted popcorn and pretzels. Onion salt, garlic salt, seasoned salt, table salt, and sea salt. Worcestershire sauce. Tartar sauce. Barbecue sauce. Teriyaki sauce. Soy sauce, including reduced-sodium. Steak sauce. Canned and packaged gravies. Fish sauce. Oyster sauce. Cocktail sauce. Horseradish that you find on the shelf. Ketchup. Mustard. Meat flavorings and tenderizers. Bouillon cubes. Hot sauce and Tabasco sauce. Premade or packaged marinades. Premade or packaged taco seasonings. Relishes. Regular salad dressings. Where to find more information:  National Heart, Lung, and Blood Institute: www.nhlbi.nih.gov  American Heart Association: www.heart.org Summary  The DASH eating plan is a healthy eating plan that has been shown to reduce high blood pressure (hypertension). It may also reduce your risk for type 2 diabetes, heart disease, and stroke.  With the DASH eating plan, you should limit salt (sodium) intake to 2,300 mg a day. If you have hypertension, you may need to reduce your sodium intake to 1,500 mg a day.  When on the DASH eating plan, aim to eat more fresh fruits and vegetables, whole grains, lean proteins, low-fat dairy, and heart-healthy fats.  Work with your health care provider or diet and nutrition specialist (dietitian) to adjust your eating plan to your individual   calorie needs. This information is not intended to replace advice given to you by your health care provider. Make sure you discuss any questions you have with your health care provider. Document Released: 11/17/2011 Document Revised: 11/21/2016 Document Reviewed: 11/21/2016 Else[employer] Interactive Patient Education  2017 [employer]  

## 2017-06-22 ENCOUNTER — Ambulatory Visit (INDEPENDENT_AMBULATORY_CARE_PROVIDER_SITE_OTHER): Payer: Medicaid Other | Admitting: Family

## 2017-06-22 ENCOUNTER — Encounter: Payer: Self-pay | Admitting: Family

## 2017-06-22 VITALS — BP 140/88 | HR 90 | Temp 97.1°F | Ht 66.0 in | Wt 223.0 lb

## 2017-06-22 DIAGNOSIS — T23232D Burn of second degree of multiple left fingers (nail), not including thumb, subsequent encounter: Secondary | ICD-10-CM

## 2017-06-22 MED ORDER — SILVER SULFADIAZINE 1 % EX CREA: 1.0000 "application " | TOPICAL_CREAM | Freq: Every day | CUTANEOUS | 0 refills | Status: DC

## 2017-06-22 NOTE — Patient Instructions (Signed)
Burn Care, Adult  A burn is an injury to the skin or the tissues under the skin. There are three types of burns:  · First degree. These burns may cause the skin to be red and slightly swollen.  · Second degree. These burns are very painful and cause the skin to be very red. The skin may also leak fluid, look shiny, and develop blisters.  · Third degree. These burns cause permanent damage. They either turn the skin white or black and make it look charred, dry, and leathery.    Taking care of your burn properly can help to prevent pain and infection. It can also help the burn to heal more quickly.  What are the risks?  Complications from burns include:  · Damage to the skin.  · Reduced blood flow near the injury.  · Dead tissue.  · Scarring.  · Problems with movement, if the burn happened near a joint or on the hands or feet.    Severe burns can lead to problems that affect the whole body, such as:  · Fluid loss.  · Less blood circulating in the body.  · Inability to maintain a normal core body temperature (thermoregulation).  · Infection.  · Shock.  · Problems breathing.    How to care for a first-degree burn  Right after a burn:  · Rinse or soak the burn under cool water until the pain stops. Do not put ice on your burn. This can cause more damage.  · Lightly cover the burn with a sterile cloth (dressing).  Burn care  · Follow instructions from your health care provider about:  ? How to clean and take care of the burn.  ? When to change and remove the dressing.  · Check your burn every day for signs of infection. Check for:  ? More redness, swelling, or pain.  ? Warmth.  ? Pus or a bad smell.  Medicine  · Take over-the-counter and prescription medicines only as told by your health care provider.  · If you were prescribed antibiotic medicine, take or apply it as told by your health care provider. Do not stop using the antibiotic even if your condition improves.  General instructions  · To prevent infection, do not  put butter, oil, or other home remedies on your burn.  · Do not rub your burn, even when you are cleaning it.  · Protect your burn from the sun.  How to care for a second-degree burn  Right after a burn:  · Rinse or soak the burn under cool water. Do this for several minutes. Do not put ice on your burn. This can cause more damage.  · Lightly cover the burn with a sterile cloth (dressing).  Burn care  · Raise (elevate) the injured area above the level of your heart while sitting or lying down.  · Follow instructions from your health care provider about:  ? How to clean and take care of the burn.  ? When to change and remove the dressing.  · Check your burn every day for signs of infection. Check for:  ? More redness, swelling, or pain.  ? Warmth.  ? Pus or a bad smell.  Medicine    · Take over-the-counter and prescription medicines only as told by your health care provider.  · If you were prescribed antibiotic medicine, take or apply it as told by your health care provider. Do not stop using the antibiotic even if your   condition improves.  General instructions  · To prevent infection:  ? Do not put butter, oil, or other home remedies on the burn.  ? Do not scratch or pick at the burn.  ? Do not break any blisters.  ? Do not peel skin.  · Do not rub your burn, even when you are cleaning it.  · Protect your burn from the sun.  How to care for a third-degree burn  Right after a burn:  · Lightly cover the burn with gauze.  · Seek immediate medical attention.  Burn care  · Raise (elevate) the injured area above the level of your heart while sitting or lying down.  · Drink enough fluid to keep your urine clear or pale yellow.  · Rest as told by your health care provider. Do not participate in sports or other physical activities until your health care provider approves.  · Follow instructions from your health care provider about:  ? How to clean and take care of the burn.  ? When to change and remove the dressing.  · Check  your burn every day for signs of infection. Check for:  ? More redness, swelling, or pain.  ? Warmth.  ? Pus or a bad smell.  Medicine  · Take over-the-counter and prescription medicines only as told by your health care provider.  · If you were prescribed antibiotic medicine, take or apply it as told by your health care provider. Do not stop using the antibiotic even if your condition improves.  General instructions  · To prevent infection:  ? Do not put butter, oil, or other home remedies on the burn.  ? Do not scratch or pick at the burn.  ? Do not break any blisters.  ? Do not peel skin.  ? Do not rub your burn, even when you are cleaning it.  · Protect your burn from the sun.  · Keep all follow-up visits as told by your health care provider. This is important.  Contact a health care provider if:  · Your condition does not improve.  · Your condition gets worse.  · You have a fever.  · Your burn changes in appearance or develops black or red spots.  · Your burn feels warm to the touch.  · Your pain is not controlled with medicine.  Get help right away if:  · You have redness, swelling, or pain at the site of the burn.  · You have fluid, blood, or pus coming from your burn.  · You have red streaks near the burn.  · You have severe pain.  This information is not intended to replace advice given to you by your health care provider. Make sure you discuss any questions you have with your health care provider.  Document Released: 11/28/2005 Document Revised: 06/19/2016 Document Reviewed: 05/17/2016  Elsevier Interactive Patient Education © 2018 [employer] Inc[employer]
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
## 2017-07-01 ENCOUNTER — Other Ambulatory Visit: Payer: Self-pay | Admitting: Family

## 2017-07-01 DIAGNOSIS — T23232D Burn of second degree of multiple left fingers (nail), not including thumb, subsequent encounter: Secondary | ICD-10-CM

## 2017-08-22 ENCOUNTER — Ambulatory Visit (INDEPENDENT_AMBULATORY_CARE_PROVIDER_SITE_OTHER): Payer: Medicaid Other | Admitting: Nurse Practitioner

## 2017-08-22 ENCOUNTER — Encounter: Payer: Self-pay | Admitting: Nurse Practitioner

## 2017-08-22 VITALS — BP 138/75 | HR 115 | Temp 98.6°F | Ht 65.75 in | Wt 228.0 lb

## 2017-08-22 DIAGNOSIS — F900 Attention-deficit hyperactivity disorder, predominantly inattentive type: Secondary | ICD-10-CM

## 2017-08-22 DIAGNOSIS — R6250 Unspecified lack of expected normal physiological development in childhood: Secondary | ICD-10-CM | POA: Diagnosis not present

## 2017-08-22 DIAGNOSIS — I10 Essential (primary) hypertension: Secondary | ICD-10-CM

## 2017-08-22 DIAGNOSIS — F19982 Other psychoactive substance use, unspecified with psychoactive substance-induced sleep disorder: Secondary | ICD-10-CM | POA: Diagnosis not present

## 2017-08-22 MED ORDER — LISDEXAMFETAMINE DIMESYLATE 50 MG PO CAPS: 50.0000 mg | ORAL_CAPSULE | Freq: Every day | ORAL | 0 refills | Status: DC

## 2017-08-22 MED ORDER — CLONIDINE HCL 0.2 MG PO TABS: 0.2000 mg | ORAL_TABLET | Freq: Every day | ORAL | 3 refills | Status: DC

## 2017-08-22 MED ORDER — LISINOPRIL 5 MG PO TABS: 5.0000 mg | ORAL_TABLET | Freq: Every day | ORAL | 5 refills | Status: DC

## 2017-08-22 NOTE — Progress Notes (Signed)
Subjective:    Patient ID: Stephen Richardson Fusaro, male    DOB: 10/31/1996, 21 y.o.   MRN: [id number]  HPI  Stephen Richardson Goga is here today for follow up of chronic medical problem.  Outpatient Encounter Prescriptions as of 08/22/2017  Medication Sig  . cloNIDine (CATAPRES) 0.2 MG tablet Take 1 tablet (0.2 mg total) by mouth daily.  Marland Kitchen. ibuprofen (ADVIL,MOTRIN) 400 MG tablet Take 400 mg by mouth every 6 (six) hours as needed for mild pain.  Marland Kitchen. lisdexamfetamine (VYVANSE) 50 MG capsule Take 1 capsule (50 mg total) by mouth daily.  Marland Kitchen. lisdexamfetamine (VYVANSE) 50 MG capsule Take 1 capsule (50 mg total) by mouth daily.  Marland Kitchen. lisdexamfetamine (VYVANSE) 50 MG capsule Take 1 capsule (50 mg total) by mouth daily.  Marland Kitchen. lisinopril (PRINIVIL,ZESTRIL) 5 MG tablet Take 1 tablet (5 mg total) by mouth daily.  Marland Kitchen. SSD 1 % cream apply ONE application topically ONCE daily   No facility-administered encounter medications on file as of 08/22/2017.     1. Essential hypertension  No c/o chest pain ,sob or headache. Does not check blood pressure at home  2. Drug induced insomnia (HCC)  Takes clonidine at night to sleep  3. Attention deficit hyperactivity disorder (ADHD), predominantly inattentive type Is on vyvanse 50 mg daily. work well to keep him calm and focused  4. Developmental delay      New complaints: none  Social history: Lives with his mom- going to try to go back to school- can go to high school until he is [age]- mom says he needs [place] back because he sits around and does nothing all day.    Review of Systems  Constitutional: Negative for activity change and appetite change.  HENT: Negative.   Eyes: Negative for pain.  Respiratory: Negative for shortness of breath.   Cardiovascular: Negative for chest pain, palpitations and leg swelling.  Gastrointestinal: Negative for abdominal pain.  Endocrine: Negative for polydipsia.  Genitourinary: Negative.   Skin: Negative for rash.  Neurological: Negative for  dizziness, weakness and headaches.  Hematological: Does not bruise/bleed easily.  Psychiatric/Behavioral: Negative.   All other systems reviewed and are negative.      Objective:   Physical Exam  Constitutional: He is oriented to person, place, and time. He appears well-developed and well-nourished. No distress.  Cardiovascular: Normal rate and regular rhythm.   Pulmonary/Chest: Effort normal and breath sounds normal.  Neurological: He is alert and oriented to person, place, and time.  Skin: Skin is warm.  Psychiatric: He has a normal mood and affect. His behavior is normal. Judgment and thought content normal.   BP 138/75   Pulse (!) 115   Temp 98.6 F (37 C) (Oral)   Ht 5' 5.75" (1.67 m)   Wt 228 lb (103.4 kg)   BMI 37.08 kg/m      Assessment & Plan:  1. Essential hypertension Low sodium diet - lisinopril (PRINIVIL,ZESTRIL) 5 MG tablet; Take 1 tablet (5 mg total) by mouth daily.  Dispense: 30 tablet; Refill: 5  2. Drug induced insomnia (HCC) - cloNIDine (CATAPRES) 0.2 MG tablet; Take 1 tablet (0.2 mg total) by mouth daily.  Dispense: 30 tablet; Refill: 3  3. Attention deficit hyperactivity disorder (ADHD), predominantly inattentive type Stress management - lisdexamfetamine (VYVANSE) 50 MG capsule; Take 1 capsule (50 mg total) by mouth daily.  Dispense: 30 capsule; Refill: 0 - lisdexamfetamine (VYVANSE) 50 MG capsule; Take 1 capsule (50 mg total) by mouth daily.  Dispense: 30 capsule; Refill: 0 -  lisdexamfetamine (VYVANSE) 50 MG capsule; Take 1 capsule (50 mg total) by mouth daily.  Dispense: 30 capsule; Refill: 0  4. Developmental delay    Labs pending Health maintenance reviewed Diet and exercise encouraged Continue all meds Follow up  In 3 months   Mary-Margaret Daphine Deutscher, FNP

## 2017-11-22 ENCOUNTER — Ambulatory Visit (INDEPENDENT_AMBULATORY_CARE_PROVIDER_SITE_OTHER): Payer: Medicaid Other

## 2017-11-22 ENCOUNTER — Ambulatory Visit (INDEPENDENT_AMBULATORY_CARE_PROVIDER_SITE_OTHER): Payer: Medicaid Other | Admitting: Family Medicine

## 2017-11-22 ENCOUNTER — Encounter: Payer: Self-pay | Admitting: Family Medicine

## 2017-11-22 VITALS — BP 128/76 | HR 107 | Temp 98.9°F | Ht 65.5 in | Wt 229.0 lb

## 2017-11-22 DIAGNOSIS — R509 Fever, unspecified: Secondary | ICD-10-CM | POA: Diagnosis not present

## 2017-11-22 DIAGNOSIS — R05 Cough: Secondary | ICD-10-CM

## 2017-11-22 DIAGNOSIS — J189 Pneumonia, unspecified organism: Secondary | ICD-10-CM

## 2017-11-22 DIAGNOSIS — R058 Other specified cough: Secondary | ICD-10-CM

## 2017-11-22 DIAGNOSIS — J181 Lobar pneumonia, unspecified organism: Secondary | ICD-10-CM | POA: Diagnosis not present

## 2017-11-22 LAB — VERITOR FLU A/B WAIVED
INFLUENZA B: NEGATIVE
Influenza A: NEGATIVE

## 2017-11-22 MED ORDER — AZITHROMYCIN 250 MG PO TABS: ORAL_TABLET | ORAL | 0 refills | Status: DC

## 2017-11-22 NOTE — Patient Instructions (Addendum)
Your chest xray was suggestive of a left-sided lower lobe pneumonia.  I have prescribed azithromycin to cover you for treatment of this.  You may continue plain Tylenol or Ibuprofen as directed if needed for fever/ pain.  I recommend that you only use cold medications that are safe in high blood pressure like Coricidin (generic is fine).  Other cold medications can increase your blood pressure.    - Get plenty of rest and drink plenty of fluids. - Try to breathe moist air. Use a cold mist humidifier. - Consume warm fluids (soup or tea) to provide relief for a stuffy nose and to loosen phlegm. - For nasal stuffiness, try saline nasal spray or a Neti Pot.  Afrin nasal spray can also be used but this product should not be used longer than 3 days or it will cause rebound nasal stuffiness (worsening nasal congestion). - For sore throat pain relief: suck on throat lozenges, hard candy or popsicles; gargle with warm salt water (1/4 tsp. salt per 8 oz. of water); and eat soft, bland foods. - Eat a well-balanced diet. If you cannot, ensure you are getting enough nutrients by taking a daily multivitamin. - Avoid dairy products, as they can thicken phlegm. - Avoid alcohol, as it impairs your body's immune system.  CONTACT YOUR DOCTOR IF YOU EXPERIENCE ANY OF THE FOLLOWING: - High fever - Ear pain - Sinus-type headache - Unusually severe cold symptoms - Cough that gets worse while other cold symptoms improve - Flare up of any chronic lung problem, such as asthma - Your symptoms persist longer than 2 weeks   Community-Acquired Pneumonia, Adult Pneumonia is an infection of the lungs. One type of pneumonia can happen while a person is in a hospital. A different type can happen when a person is not in a hospital (community-acquired pneumonia). It is easy for this kind to spread from person to person. It can spread to you if you breathe near an infected person who coughs or sneezes. Some symptoms  include:  A dry cough.  A wet (productive) cough.  Fever.  Sweating.  Chest pain.  Follow these instructions at home:  Take over-the-counter and prescription medicines only as told by your doctor. ? Only take cough medicine if you are losing sleep. ? If you were prescribed an antibiotic medicine, take it as told by your doctor. Do not stop taking the antibiotic even if you start to feel better.  Sleep with your head and neck raised (elevated). You can do this by putting a few pillows under your head, or you can sleep in a recliner.  Do not use tobacco products. These include cigarettes, chewing tobacco, and e-cigarettes. If you need help quitting, ask your doctor.  Drink enough water to keep your pee (urine) clear or pale yellow. A shot (vaccine) can help prevent pneumonia. Shots are often suggested for:  People older than 20 years of age.  People older than 20 years of age: ? Who are having cancer treatment. ? Who have long-term (chronic) lung disease. ? Who have problems with their body's defense system (immune system).  You may also prevent pneumonia if you take these actions:  Get the flu (influenza) shot every year.  Go to the dentist as often as told.  Wash your hands often. If soap and water are not available, use hand sanitizer.  Contact a doctor if:  You have a fever.  You lose sleep because your cough medicine does not help. Get help right  away if:  You are short of breath and it gets worse.  You have more chest pain.  Your sickness gets worse. This is very serious if: ? You are an older adult. ? Your body's defense system is weak.  You cough up blood. This information is not intended to replace advice given to you by your health care provider. Make sure you discuss any questions you have with your health care provider. Document Released: 05/16/2008 Document Revised: 05/05/2016 Document Reviewed: 03/25/2015 Elsevier Interactive Patient Education   [employer]2018 Elsevier Inc.

## 2017-11-22 NOTE — Progress Notes (Signed)
Subjective: UJ:WJXBJCC:cough PCP: Bennie PieriniMartin, Mary-Margaret, FNP YNW:GNFAOZHPI:Stephen Richardson is a 21 y.o. male presenting to clinic today for:  1. Cough Patient reports productive cough, post tussive vomiting, fever to 101F that started Saturday and has gotten worse over the last few days.  He reports rib pain with prolonged coughing.  Denies hemoptysis, rhinorrhea, sinus pressure, headache, SOB, dizziness, rash, nausea, diarrhea, fevers, chills, myalgia, sick contacts, recent travel.  Patient has used Motrin 400mg  and Tylenol flu with some relief of symptoms.  No history of COPD or asthma.  No tobacco use/ exposure. Mother is present with patient who voiced some concern over possible pneumonia.  She notes that his twin brother had very similar symptoms last year and required treatment for pneumonia.   ROS: Per HPI  No Known Allergies Past Medical History:  Diagnosis Date  . ADD (attention deficit disorder with hyperactivity)   . Chest pain on exertion   . ODD (oppositional defiant disorder)     Current Outpatient Medications:  .  cloNIDine (CATAPRES) 0.2 MG tablet, Take 1 tablet (0.2 mg total) by mouth daily., Disp: 30 tablet, Rfl: 3 .  ibuprofen (ADVIL,MOTRIN) 400 MG tablet, Take 400 mg by mouth every 6 (six) hours as needed for mild pain., Disp: , Rfl:  .  lisdexamfetamine (VYVANSE) 50 MG capsule, Take 1 capsule (50 mg total) by mouth daily., Disp: 30 capsule, Rfl: 0 .  lisdexamfetamine (VYVANSE) 50 MG capsule, Take 1 capsule (50 mg total) by mouth daily., Disp: 30 capsule, Rfl: 0 .  lisdexamfetamine (VYVANSE) 50 MG capsule, Take 1 capsule (50 mg total) by mouth daily., Disp: 30 capsule, Rfl: 0 .  lisinopril (PRINIVIL,ZESTRIL) 5 MG tablet, Take 1 tablet (5 mg total) by mouth daily., Disp: 30 tablet, Rfl: 5 Social History   Socioeconomic History  . Marital status: Single    Spouse name: Not on file  . Number of children: Not on file  . Years of education: Not on file  . Highest education level: Not  on file  Social Needs  . Financial resource strain: Not on file  . Food insecurity - worry: Not on file  . Food insecurity - inability: Not on file  . Transportation needs - medical: Not on file  . Transportation needs - non-medical: Not on file  Occupational History  . Not on file  Tobacco Use  . Smoking status: Never Smoker  . Smokeless tobacco: Never Used  Substance and Sexual Activity  . Alcohol use: No  . Drug use: No  . Sexual activity: No  Other Topics Concern  . Not on file  Social History Narrative  . Not on file   Family History  Problem Relation Age of Onset  . Kidney disease Father   . Diabetes Father   . Pancreatitis Father     Objective: Office vital signs reviewed. BP 128/76   Pulse (!) 107   Temp 98.9 F (37.2 C) (Oral)   Ht 5' 5.5" (1.664 m)   Wt 229 lb (103.9 kg)   SpO2 93%   BMI 37.53 kg/m   Physical Examination:  General: Awake, alert, obese, No acute distress HEENT: Normal    Neck: No masses palpated. No lymphadenopathy    Ears: Tympanic membranes intact, normal light reflex, no erythema, no bulging    Eyes: PERRLA, extraocular membranes intact, sclera white, no ocular discharge    Nose: nasal turbinates moist, clear nasal discharge    Throat: moist mucus membranes, mild oropharyngeal erythema, no tonsillar exudate.  Airway is patent Cardio: regular rate and rhythm, S1S2 heard, no murmurs appreciated Pulm: Slight decreased breath sounds appreciated on the left lung fields compared to the right.  No wheezes, rhonchi or rales; normal work of breathing on room air  Dg Chest 2 View  Result Date: 11/22/2017 CLINICAL DATA:  Fever and cough EXAM: CHEST  2 VIEW COMPARISON:  Chest radiograph 04/26/2016 FINDINGS: There is increased parenchymal opacity throughout the left lower lobe. The remainder of the lungs is clear. No pleural effusion or pneumothorax. Normal cardiomediastinal contours. IMPRESSION: Left lower lobe pneumonia. Electronically Signed    By: Deatra RobinsonKevin  Herman M.D.   On: 11/22/2017 13:28     Assessment/ Plan: 21 y.o. male   1. Community acquired pneumonia of left lower lobe of lung (HCC) Physical exam was remarkable for decreased breath sounds on the left lung fields.  Vital signs demonstrate slight increased heart rate at 107 and a pulse ox of 93% on room air.  Patient's vitals were otherwise unremarkable.  He was nontoxic-appearing with normal work of breathing on room air.  Chest x-ray was obtained which did demonstrate upon my personal read a left-sided pulmonary infiltrate suggestive of pneumonia.  Radiologist review confirmed this finding as a left lower lobe pneumonia.  Patient was prescribed azithromycin to cover for community-acquired pneumonia.  Home care instructions were reviewed with the patient.  Strict return precautions and reasons for emergent evaluation in the emergency department review with patient.  They voiced understanding and will follow-up as needed. - Veritor Flu A/B Waived - DG Chest 2 View; Future  2. Productive cough - DG Chest 2 View; Future   Orders Placed This Encounter  Procedures  . DG Chest 2 View    Standing Status:   Future    Number of Occurrences:   1    Standing Expiration Date:   01/23/2019    Order Specific Question:   Reason for Exam (SYMPTOM  OR DIAGNOSIS REQUIRED)    Answer:   cough, fever    Order Specific Question:   Preferred imaging location?    Answer:   Internal    Order Specific Question:   Radiology Contrast Protocol - do NOT remove file path    Answer:   [email or web address]  . Veritor Flu A/B Waived    Order Specific Question:   Source    Answer:   nasal   Meds ordered this encounter  Medications  . azithromycin (ZITHROMAX Z-PAK) 250 MG tablet    Sig: Take 500 mg day 1, then take 250 mg days 2 through 5.    Dispense:  6 tablet    Refill:  0   Zaara Sprowl Hulen SkainsM Presidio Jon, DO [facility] [phone]  [phone]

## 2017-11-23 ENCOUNTER — Ambulatory Visit: Payer: Medicaid Other | Admitting: Nurse Practitioner

## 2017-12-21 ENCOUNTER — Encounter: Payer: Self-pay | Admitting: Nurse Practitioner

## 2017-12-21 ENCOUNTER — Ambulatory Visit (INDEPENDENT_AMBULATORY_CARE_PROVIDER_SITE_OTHER): Payer: Medicaid Other | Admitting: Nurse Practitioner

## 2017-12-21 VITALS — BP 142/90 | HR 107 | Temp 97.3°F | Ht 66.5 in | Wt 231.0 lb

## 2017-12-21 DIAGNOSIS — Z23 Encounter for immunization: Secondary | ICD-10-CM | POA: Diagnosis not present

## 2017-12-21 DIAGNOSIS — R6250 Unspecified lack of expected normal physiological development in childhood: Secondary | ICD-10-CM

## 2017-12-21 DIAGNOSIS — F900 Attention-deficit hyperactivity disorder, predominantly inattentive type: Secondary | ICD-10-CM

## 2017-12-21 DIAGNOSIS — I10 Essential (primary) hypertension: Secondary | ICD-10-CM

## 2017-12-21 MED ORDER — LISDEXAMFETAMINE DIMESYLATE 50 MG PO CAPS: 50.0000 mg | ORAL_CAPSULE | Freq: Every day | ORAL | 0 refills | Status: DC

## 2017-12-21 NOTE — Patient Instructions (Signed)
Supporting Someone With Attention Deficit Hyperactivity Disorder Attention deficit hyperactivity disorder (ADHD) is a behavior problem that is present in a person due to the way that his or her brain functions (neurobehavioral disorder). It is a common cause of behavioral and learning (academic) problems among children. ADHD is a long-term (chronic) condition. If this disorder is not treated, it can have serious effects into adolescence and adulthood. When a person has ADHD, his or her condition can affect others around him or her, such as friends and family members. Friends and family can help by offering support and understanding. What do I need to know about this condition? ADHD can affect daily functioning in ways that often cause problems for the person with ADHD and his or her friends and family members. A child with ADHD may:  Have a poor attention span. This means that he or she can only stay focused or interested in something for a short time.  Get distracted easily.  Have trouble listening to instructions.  Daydream.  Make careless mistakes.  Be forgetful.  Talk too much, such as blurting out answers to questions.  Have trouble sitting still for long.  Fidget or get out of his or her seat during class.  An adult with ADHD may:  Get distracted easily.  Be disorganized at home and work.  Miss, forget, or be late for appointments.  Have trouble with details.  Have trouble completing tasks.  Be irritable and impatient.  Get bored easily during meetings.  Have great difficulty concentrating.  What do I need to know about the treatment options? Treatment for this condition usually involves:  Behavioral treatment. Working with a therapist, the person with ADHD may: ? Set rewards for desired behavior. ? Set small goals and clear expectations, and be held accountable for meeting them. ? Get help with planning and timing activities. ? Become more patient and more  mindful of the condition.  Medicines, such as: ? Stimulant medicines that help a person to:  Control his or her behavior (decrease impulsivity).  Control his or her extra physical activity (decrease hyperactivity).  Increase his or her ability to pay attention. ? Antidepressants. ? Certain blood pressure medicines.  Structured classroom management for children at school, such as tutoring or extra support in classes.  Techniques for parents to use at home to help manage their child's symptoms and behavior. These include rewarding good behavior, providing consistent discipline, and setting limits.  How can I support my loved one? Talk about the condition  Pick a time to talk with your loved one when distractions and interruptions are unlikely.  Let your loved one know that he or she is capable of success. Focus on your loved one's strengths, and try to not let your loved one use ADHD as an excuse for undesirable behavior.  Let your loved one know that there are well-known, successful people who also have ADHD. This may be encouraging to your loved one.  Give your loved one time to process his or her thoughts and to ask questions.  Children with ADHD may benefit from hearing more about how their treatment plan will help them. This may help them focus on goal behaviors. Find support and resources A health care provider may be able to recommend resources that are available online or over the phone. You could start with:  Attention Deficit Disorder Association (ADDA): add.org  National Institute of Mental Health (NIMH): www.nimh.nih.gov/health/publications/attention-deficit-hyperactivity-disorder-adhd-the-basics/index.shtml  Training classes or conferences that help parents of children with   ADHD to support their children and cope with the disorder.  Support groups for families who are affected by ADHD.  General support If you are a parent of a child with ADHD, you can take the  following actions to support your child's education:  Talk to teachers about the ways that your child learns best.  Be your child's advocate and stay in touch with his or her school about all problems related to ADHD.  At the end of the summer, make appointments to talk with teachers and other school staff before the new school year begins.  Listen to teachers carefully, and share your child's history with them.  Create a behavior plan that your child, your family, and the teachers can agree on. Write down goals to help your child succeed.  How should I care for myself? It is important to find ways to care for your body, mind, and well-being while supporting someone with ADHD.  Spend time with friends and family. Find someone you can talk to who will also help you work on using coping skills to manage stress.  Understand what your limits are. Say "no" to requests or events that lead to a schedule that is too busy.  Make time for activities that help you relax, and try to not feel guilty about taking time for yourself.  Consider trying meditation and deep breathing exercises to lower your stress.  Get plenty of sleep.  Exercise, even if it is just taking a short walk a few times a week.  If you are a parent of a child with ADHD, arrange for child care so you can take breaks once in a while.  What are some signs that the condition is getting worse? Signs that your loved one's condition may be getting worse include:  Increased trouble completing tasks and paying attention.  Hyperactivity and impulsivity.  Problems with relationships.  Impatience, restlessness, and mood swings.  Worsening problems at school, if applicable.  Contact a health care provider if:  Your loved one's symptoms get worse.  Your loved one shows signs of depression, anxiety, or another mental health condition.  Your child has behavioral problems at school. Summary  Attention deficit hyperactivity  disorder (ADHD) is a long-term (chronic) condition that can affect daily functioning in ways that often cause problems for the person with ADHD and his or her loved ones.  This disorder can be treated effectively with medicine, behavioral treatment, and techniques to manage symptoms and behaviors.  Many organizations and groups are available to help families to manage ADHD.  The support people in the life of someone with ADHD play an extremely important role in helping that person develop healthy behaviors to live a satisfying life.  It is important to find ways to care for your own body, mind, and well-being while supporting someone with ADHD. Make time for activities that help you relax. This information is not intended to replace advice given to you by your health care provider. Make sure you discuss any questions you have with your health care provider. Document Released: 04/11/2017 Document Revised: 04/11/2017 Document Reviewed: 04/11/2017 Elsevier Interactive Patient Education  2018 [employer]

## 2017-12-21 NOTE — Progress Notes (Signed)
   Subjective:    Patient ID: Stephen Richardson, male    DOB: 12/26/1995, 22 y.o.   MRN: [id number]  HPI Patient brought in today by mom for follow up of ADHD. Currently taking vyvanse 50mg  daily. Behavior- good Grades- no longer in school- is mentally delayed and is to old to be in school any longer Medication side effects- none Weight loss- none Sleeping habits- none Any concerns- none today  Hypertension- is on lisinoprl- has aortic valve insufficiency- sees cardiology yealy   [place] CSRS reviewed: No Any suspicious activity on [place] Csrs: No     Review of Systems  Constitutional: Negative for activity change and appetite change.  HENT: Negative.   Eyes: Negative for pain.  Respiratory: Negative for shortness of breath.   Cardiovascular: Negative for chest pain, palpitations and leg swelling.  Gastrointestinal: Negative for abdominal pain.  Endocrine: Negative for polydipsia.  Genitourinary: Negative.   Skin: Negative for rash.  Neurological: Negative for dizziness, weakness and headaches.  Hematological: Does not bruise/bleed easily.  Psychiatric/Behavioral: Negative.   All other systems reviewed and are negative.      Objective:   Physical Exam  Constitutional: He is oriented to person, place, and time. He appears well-developed and well-nourished.  HENT:  Head: Normocephalic.  Right Ear: External ear normal.  Left Ear: External ear normal.  Nose: Nose normal.  Mouth/Throat: Oropharynx is clear and moist.  Eyes: EOM are normal. Pupils are equal, round, and reactive to light.  Neck: Normal range of motion. Neck supple. No JVD present. No thyromegaly present.  Cardiovascular: Normal rate, regular rhythm, normal heart sounds and intact distal pulses. Exam reveals no gallop and no friction rub.  No murmur heard. Pulmonary/Chest: Effort normal and breath sounds normal. No respiratory distress. He has no wheezes. He has no rales. He exhibits no tenderness.  Abdominal: Soft.  Bowel sounds are normal. He exhibits no mass. There is no tenderness.  Genitourinary: Prostate normal and penis normal.  Musculoskeletal: Normal range of motion. He exhibits no edema.  Lymphadenopathy:    He has no cervical adenopathy.  Neurological: He is alert and oriented to person, place, and time. No cranial nerve deficit.  Skin: Skin is warm and dry.  Psychiatric: He has a normal mood and affect. His behavior is normal. Judgment and thought content normal.    BP (!) 142/90   Pulse (!) 107   Temp (!) 97.3 F (36.3 C) (Oral)   Ht 5' 6.5" (1.689 m)   Wt 231 lb (104.8 kg)   BMI 36.73 kg/m      Assessment & Plan:  1. Attention deficit hyperactivity disorder (ADHD), predominantly inattentive type Continue behavior modification - lisdexamfetamine (VYVANSE) 50 MG capsule; Take 1 capsule (50 mg total) by mouth daily.  Dispense: 30 capsule; Refill: 0 - lisdexamfetamine (VYVANSE) 50 MG capsule; Take 1 capsule (50 mg total) by mouth daily.  Dispense: 30 capsule; Refill: 0 - lisdexamfetamine (VYVANSE) 50 MG capsule; Take 1 capsule (50 mg total) by mouth daily.  Dispense: 30 capsule; Refill: 0  2. Developmental delay  3. Essential hypertension Needs to exercise Low fat diet  [name], FNP

## 2018-03-02 ENCOUNTER — Encounter: Payer: Self-pay | Admitting: Nurse Practitioner

## 2018-03-02 ENCOUNTER — Ambulatory Visit (INDEPENDENT_AMBULATORY_CARE_PROVIDER_SITE_OTHER): Payer: Medicaid Other | Admitting: Nurse Practitioner

## 2018-03-02 VITALS — BP 150/90 | HR 115 | Temp 96.8°F | Ht 66.0 in | Wt 243.0 lb

## 2018-03-02 DIAGNOSIS — F900 Attention-deficit hyperactivity disorder, predominantly inattentive type: Secondary | ICD-10-CM | POA: Diagnosis not present

## 2018-03-02 DIAGNOSIS — K921 Melena: Secondary | ICD-10-CM | POA: Diagnosis not present

## 2018-03-02 DIAGNOSIS — K648 Other hemorrhoids: Secondary | ICD-10-CM

## 2018-03-02 MED ORDER — LISDEXAMFETAMINE DIMESYLATE 50 MG PO CAPS: 50.0000 mg | ORAL_CAPSULE | Freq: Every day | ORAL | 0 refills | Status: DC

## 2018-03-02 NOTE — Patient Instructions (Signed)
Hemorrhoids    Hemorrhoids are swollen veins in and around the rectum or anus. Hemorrhoids can cause pain, itching, or bleeding. Most of the time, they do not cause serious problems. They usually get better with diet changes, lifestyle changes, and other home treatments.  Follow these instructions at home:  Eating and drinking  · Eat foods that have fiber, such as whole grains, beans, nuts, fruits, and vegetables. Ask your doctor about taking products that have added fiber (fiber supplements).  · Drink enough fluid to keep your pee (urine) clear or pale yellow.  For Pain and Swelling  · Take a warm-water bath (sitz bath) for 20 minutes to ease pain. Do this 3-4 times a day.  · If directed, put ice on the painful area. It may be helpful to use ice between your warm baths.  ¨ Put ice in a plastic bag.  ¨ Place a towel between your skin and the bag.  ¨ Leave the ice on for 20 minutes, 2-3 times a day.  General instructions  · Take over-the-counter and prescription medicines only as told by your doctor.  ¨ Medicated creams and medicines that are inserted into the anus (suppositories) may be used or applied as told.  · Exercise often.  · Go to the bathroom when you have the urge to poop (to have a bowel movement). Do not wait.  · Avoid pushing too hard (straining) when you poop.  · Keep the butt area dry and clean. Use wet toilet paper or moist paper towels.  · Do not sit on the toilet for a long time.  Contact a doctor if:  · You have any of these:  ¨ Pain and swelling that do not get better with treatment or medicine.  ¨ Bleeding that will not stop.  ¨ Trouble pooping or you cannot poop.  ¨ Pain or swelling outside the area of the hemorrhoids.  This information is not intended to replace advice given to you by your health care provider. Make sure you discuss any questions you have with your health care provider.  Document Released: 09/06/2008 Document Revised: 05/05/2016 Document Reviewed: 08/12/2015  Elsevier  Interactive Patient Education © 2018 Elsevier Inc.   

## 2018-03-02 NOTE — Progress Notes (Signed)
   Subjective:    Patient ID: Stephen LeitzJohnny Parrow, male    DOB: 11/24/1996, 22 y.o.   MRN: [id number]  HPI  Patient comes in today without his mom which is unusual. He is c/o blood in stool- he stays he has to strain to go to the restroom. denies any pain. Says stool may be a little dark. He says there is no blood in toilet only on toilet paper when he wipes.   Review of Systems  Constitutional: Negative.   HENT: Negative.   Respiratory: Negative.   Cardiovascular: Negative.   Gastrointestinal: Positive for blood in stool and constipation.  Genitourinary: Negative.   Neurological: Negative.   Psychiatric/Behavioral: Negative.   All other systems reviewed and are negative.      Objective:   Physical Exam  Constitutional: He is oriented to person, place, and time. He appears well-developed and well-nourished. No distress.  Cardiovascular: Normal rate and regular rhythm.  Pulmonary/Chest: Effort normal and breath sounds normal.  Genitourinary: Rectal exam shows guaiac negative stool.  Genitourinary Comments: One small non thrombosed hemorrhoid at 6 oclock position  Neurological: He is alert and oriented to person, place, and time.  Skin: Skin is warm.  Psychiatric: He has a normal mood and affect. His behavior is normal. Judgment and thought content normal.    BP (!) 150/90   Pulse (!) 115   Temp (!) 96.8 F (36 C) (Oral)   Ht 5\' 6"  (1.676 m)   Wt 243 lb (110.2 kg)   BMI 39.22 kg/m        Assessment & Plan:   1. Blood in stool   2. Internal hemorrhoid    Daily stool softner Increase fiber in diet Force fluids RTO prn  [name], FNP

## 2018-03-22 ENCOUNTER — Ambulatory Visit: Payer: Medicaid Other | Admitting: Nurse Practitioner

## 2018-03-27 ENCOUNTER — Encounter: Payer: Self-pay | Admitting: Nurse Practitioner

## 2018-06-08 ENCOUNTER — Ambulatory Visit: Payer: Medicaid Other | Admitting: Nurse Practitioner

## 2018-06-18 ENCOUNTER — Encounter: Payer: Self-pay | Admitting: Nurse Practitioner

## 2018-08-21 ENCOUNTER — Encounter: Payer: Self-pay | Admitting: Nurse Practitioner

## 2018-08-21 ENCOUNTER — Ambulatory Visit (INDEPENDENT_AMBULATORY_CARE_PROVIDER_SITE_OTHER): Payer: Medicaid Other | Admitting: Nurse Practitioner

## 2018-08-21 VITALS — BP 126/76 | HR 78 | Temp 97.5°F | Ht 66.0 in | Wt 234.0 lb

## 2018-08-21 DIAGNOSIS — I10 Essential (primary) hypertension: Secondary | ICD-10-CM

## 2018-08-21 DIAGNOSIS — Z6837 Body mass index (BMI) 37.0-37.9, adult: Secondary | ICD-10-CM

## 2018-08-21 DIAGNOSIS — R6250 Unspecified lack of expected normal physiological development in childhood: Secondary | ICD-10-CM | POA: Diagnosis not present

## 2018-08-21 DIAGNOSIS — F900 Attention-deficit hyperactivity disorder, predominantly inattentive type: Secondary | ICD-10-CM | POA: Diagnosis not present

## 2018-08-21 MED ORDER — LISDEXAMFETAMINE DIMESYLATE 50 MG PO CAPS: 50.0000 mg | ORAL_CAPSULE | Freq: Every day | ORAL | 0 refills | Status: DC

## 2018-08-21 MED ORDER — LISINOPRIL 5 MG PO TABS: 5.0000 mg | ORAL_TABLET | Freq: Every day | ORAL | 5 refills | Status: DC

## 2018-08-21 NOTE — Patient Instructions (Signed)
Exercising to Lose Weight Exercising can help you to lose weight. In order to lose weight through exercise, you need to do vigorous-intensity exercise. You can tell that you are exercising with vigorous intensity if you are breathing very hard and fast and cannot hold a conversation while exercising. Moderate-intensity exercise helps to maintain your current weight. You can tell that you are exercising at a moderate level if you have a higher heart rate and faster breathing, but you are still able to hold a conversation. How often should I exercise? Choose an activity that you enjoy and set realistic goals. Your health care provider can help you to make an activity plan that works for you. Exercise regularly as directed by your health care provider. This may include:  Doing resistance training twice each week, such as: ? Push-ups. ? Sit-ups. ? Lifting weights. ? Using resistance bands.  Doing a given intensity of exercise for a given amount of time. Choose from these options: ? 150 minutes of moderate-intensity exercise every week. ? 75 minutes of vigorous-intensity exercise every week. ? A mix of moderate-intensity and vigorous-intensity exercise every week.  Children, pregnant women, people who are out of shape, people who are overweight, and older adults may need to consult a health care provider for individual recommendations. If you have any sort of medical condition, be sure to consult your health care provider before starting a new exercise program. What are some activities that can help me to lose weight?  Walking at a rate of at least 4.5 miles an hour.  Jogging or running at a rate of 5 miles per hour.  Biking at a rate of at least 10 miles per hour.  Lap swimming.  Roller-skating or in-line skating.  Cross-country skiing.  Vigorous competitive sports, such as football, basketball, and soccer.  Jumping rope.  Aerobic dancing. How can I be more active in my day-to-day  activities?  Use the stairs instead of the elevator.  Take a walk during your lunch break.  If you drive, park your car farther away from work or school.  If you take public transportation, get off one stop early and walk the rest of the way.  Make all of your phone calls while standing up and walking around.  Get up, stretch, and walk around every 30 minutes throughout the day. What guidelines should I follow while exercising?  Do not exercise so much that you hurt yourself, feel dizzy, or get very short of breath.  Consult your health care provider prior to starting a new exercise program.  Wear comfortable clothes and shoes with good support.  Drink plenty of water while you exercise to prevent dehydration or heat stroke. Body water is lost during exercise and must be replaced.  Work out until you breathe faster and your heart beats faster. This information is not intended to replace advice given to you by your health care provider. Make sure you discuss any questions you have with your health care provider. Document Released: 12/31/2010 Document Revised: 05/05/2016 Document Reviewed: 05/01/2014 Elsevier Interactive Patient Education  2018 Elsevier Inc.  

## 2018-08-21 NOTE — Progress Notes (Signed)
Subjective:    Patient ID: Stephen Richardson, male    DOB: January 08, 1996, 22 y.o.   MRN: [id number]   Chief Complaint: Medical Management of Chronic issues  HPI:  1. Essential hypertension  No c/o chest pain, sob or headache. Does not check bloodpressure at home. BP Readings from Last 3 Encounters:  03/02/18 (!) 150/90  12/21/17 (!) 142/90  11/22/17 128/76     2. Attention deficit hyperactivity disorder (ADHD), predominantly inattentive type  patient is on vyvanse 50mg  daily. Has been taking for many years. He has no side effects from meds.  3. Developmental delay  He is no longer in school because of his age.  4.      BMI 37.0-37.9          His weight is actually down 9 lbs from last visit    Outpatient Encounter Medications as of 08/21/2018  Medication Sig  . lisdexamfetamine (VYVANSE) 50 MG capsule Take 1 capsule (50 mg total) by mouth daily.  Marland Kitchen lisdexamfetamine (VYVANSE) 50 MG capsule Take 1 capsule (50 mg total) by mouth daily.  Marland Kitchen lisdexamfetamine (VYVANSE) 50 MG capsule Take 1 capsule (50 mg total) by mouth daily.  Marland Kitchen lisinopril (PRINIVIL,ZESTRIL) 5 MG tablet Take 1 tablet (5 mg total) by mouth daily.      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 - his mom is in [place] taking care of grandkids because her daughter was arrested.   Review of Systems  Constitutional: Negative for activity change and appetite change.  HENT: Negative.   Eyes: Negative for pain.  Respiratory: Negative for shortness of breath.   Cardiovascular: Negative for chest pain, palpitations and leg swelling.  Gastrointestinal: Negative for abdominal pain.  Endocrine: Negative for polydipsia.  Genitourinary: Negative.   Skin: Negative for rash.  Neurological: Negative for dizziness, weakness and headaches.  Hematological: Does not bruise/bleed easily.  Psychiatric/Behavioral: Negative.   All other systems reviewed and are  negative.      Objective:   Physical Exam  Constitutional: He is oriented to person, place, and time. He appears well-developed and well-nourished. No distress.  Cardiovascular: Normal rate and regular rhythm.  Pulmonary/Chest: Effort normal and breath sounds normal.  Abdominal: Soft. Bowel sounds are normal.  Neurological: He is alert and oriented to person, place, and time.  Skin: Skin is warm.    BP 126/76   Pulse 78   Temp (!) 97.5 F (36.4 C) (Oral)   Ht 5\' 6"  (1.676 m)   Wt 234 lb (106.1 kg)   BMI 37.77 kg/m        Assessment & Plan:  Stephen Richardson comes in today with chief complaint of No chief complaint on file.   Diagnosis and orders addressed:  1. Essential hypertension Low sodium diet - lisinopril (PRINIVIL,ZESTRIL) 5 MG tablet; Take 1 tablet (5 mg total) by mouth daily.  Dispense: 30 tablet; Refill: 5  2. Attention deficit hyperactivity disorder (ADHD), predominantly inattentive type Stress management - lisdexamfetamine (VYVANSE) 50 MG capsule; Take 1 capsule (50 mg total) by mouth daily.  Dispense: 30 capsule; Refill: 0 - lisdexamfetamine (VYVANSE) 50 MG capsule; Take 1 capsule (50 mg total) by mouth daily.  Dispense: 30 capsule; Refill: 0 - lisdexamfetamine (VYVANSE) 50 MG capsule; Take 1 capsule (50 mg total) by mouth daily.  Dispense: 30 capsule; Refill: 0  3. Developmental delay  4. BMI 37.0-37.9, adult Discussed diet and exercise for person with BMI >  25 Will recheck weight in 3-6 months   Labs pending Health Maintenance reviewed Diet and exercise encouraged  Follow up plan: 3 months   Mary-Margaret Daphine Deutscher, FNP

## 2018-11-23 ENCOUNTER — Encounter: Payer: Self-pay | Admitting: Nurse Practitioner

## 2018-11-23 ENCOUNTER — Ambulatory Visit (INDEPENDENT_AMBULATORY_CARE_PROVIDER_SITE_OTHER): Payer: Medicaid Other | Admitting: Nurse Practitioner

## 2018-11-23 VITALS — BP 132/80 | HR 107 | Temp 97.7°F | Ht 66.0 in | Wt 239.0 lb

## 2018-11-23 DIAGNOSIS — Z23 Encounter for immunization: Secondary | ICD-10-CM | POA: Diagnosis not present

## 2018-11-23 DIAGNOSIS — Z6837 Body mass index (BMI) 37.0-37.9, adult: Secondary | ICD-10-CM

## 2018-11-23 DIAGNOSIS — I10 Essential (primary) hypertension: Secondary | ICD-10-CM | POA: Diagnosis not present

## 2018-11-23 DIAGNOSIS — F900 Attention-deficit hyperactivity disorder, predominantly inattentive type: Secondary | ICD-10-CM | POA: Diagnosis not present

## 2018-11-23 DIAGNOSIS — R6250 Unspecified lack of expected normal physiological development in childhood: Secondary | ICD-10-CM

## 2018-11-23 MED ORDER — LISDEXAMFETAMINE DIMESYLATE 50 MG PO CAPS: 50.0000 mg | ORAL_CAPSULE | Freq: Every day | ORAL | 0 refills | Status: DC

## 2018-11-23 MED ORDER — LISINOPRIL 5 MG PO TABS: 5.0000 mg | ORAL_TABLET | Freq: Every day | ORAL | 5 refills | Status: DC

## 2018-11-23 NOTE — Progress Notes (Signed)
Subjective:    Patient ID: Stephen Richardson, male    DOB: [birth year], 22 y.o.   MRN: [id number]    Chief Complaint: Medical Management of Chronic Issues   HPI:  1. Essential hypertension  No c/o chest pan, sob or headache. Does not check blood pressure at home. BP Readings from Last 3 Encounters:  11/23/18 132/80  08/21/18 126/76  03/02/18 (!) 150/90     2. BMI 37.0-37.9, adult  Weight is up 5 lbs since last check  3. Attention deficit hyperactivity disorder (ADHD), predominantly inattentive type He is on vyvanse 4m daily and is doing well. Can not stay calm and concentrate when not on meds   4. Developmental delay  He is not able to work due to developmental delay.    Outpatient Encounter Medications as of 11/23/2018  Medication Sig  . lisinopril (PRINIVIL,ZESTRIL) 5 MG tablet Take 1 tablet (5 mg total) by mouth daily.  .Marland Kitchenlisdexamfetamine (VYVANSE) 50 MG capsule Take 1 capsule (50 mg total) by mouth daily.  .Marland Kitchenlisdexamfetamine (VYVANSE) 50 MG capsule Take 1 capsule (50 mg total) by mouth daily.  .Marland Kitchenlisdexamfetamine (VYVANSE) 50 MG capsule Take 1 capsule (50 mg total) by mouth daily.     New complaints: Mom says he voids a lot . Seems to have increased ove rth elast 3 months. He drinks a lot.  Social history: Lives with his mom   Review of Systems  Constitutional: Negative for activity change and appetite change.  HENT: Negative.   Eyes: Negative for pain.  Respiratory: Negative for shortness of breath.   Cardiovascular: Negative for chest pain, palpitations and leg swelling.  Gastrointestinal: Negative for abdominal pain.  Endocrine: Negative for polydipsia.  Genitourinary: Negative.   Skin: Negative for rash.  Neurological: Negative for dizziness, weakness and headaches.  Hematological: Does not bruise/bleed easily.  Psychiatric/Behavioral: Negative.   All other systems reviewed and are negative.      Objective:   Physical Exam Vitals signs and nursing  note reviewed.  Constitutional:      Appearance: Normal appearance. He is well-developed.  HENT:     Head: Normocephalic.     Nose: Nose normal.  Eyes:     Pupils: Pupils are equal, round, and reactive to light.  Neck:     Musculoskeletal: Normal range of motion and neck supple.     Thyroid: No thyroid mass or thyromegaly.     Vascular: No carotid bruit or JVD.     Trachea: Phonation normal.  Cardiovascular:     Rate and Rhythm: Normal rate and regular rhythm.  Pulmonary:     Effort: Pulmonary effort is normal. No respiratory distress.     Breath sounds: Normal breath sounds.  Abdominal:     General: Bowel sounds are normal.     Palpations: Abdomen is soft.     Tenderness: There is no abdominal tenderness.  Musculoskeletal: Normal range of motion.  Lymphadenopathy:     Cervical: No cervical adenopathy.  Skin:    General: Skin is warm and dry.  Neurological:     Mental Status: He is alert and oriented to person, place, and time.  Psychiatric:        Behavior: Behavior normal.        Thought Content: Thought content normal.        Judgment: Judgment normal.    BP 132/80   Pulse (!) 107   Temp 97.7 F (36.5 C) (Oral)   Ht 5' 6"  (1.676 m)  Wt 239 lb (108.4 kg)   BMI 38.58 kg/m       Assessment & Plan:  Herminio Kniskern comes in today with chief complaint of Medical Management of Chronic Issues   Diagnosis and orders addressed:  1. Essential hypertension Low sodium diet - lisinopril (PRINIVIL,ZESTRIL) 5 MG tablet; Take 1 tablet (5 mg total) by mouth daily.  Dispense: 30 tablet; Refill: 5 - CMP14+EGFR - Lipid panel  2. BMI 38.0-38.9, adult Discussed diet and exercise for person with BMI >25 Will recheck weight in 3-6 months   3. Attention deficit hyperactivity disorder (ADHD), predominantly inattentive type - lisdexamfetamine (VYVANSE) 50 MG capsule; Take 1 capsule (50 mg total) by mouth daily.  Dispense: 30 capsule; Refill: 0 - lisdexamfetamine (VYVANSE) 50  MG capsule; Take 1 capsule (50 mg total) by mouth daily.  Dispense: 30 capsule; Refill: 0 - lisdexamfetamine (VYVANSE) 50 MG capsule; Take 1 capsule (50 mg total) by mouth daily.  Dispense: 30 capsule; Refill: 0  4. Developmental delay   Labs pending Health Maintenance reviewed Diet and exercise encouraged  Follow up plan: 3 months   Mary-Margaret Hassell Done, FNP

## 2018-11-24 LAB — CMP14+EGFR
ALT: 9 IU/L (ref 0–44)
AST: 19 IU/L (ref 0–40)
Albumin/Globulin Ratio: 1.9 (ref 1.2–2.2)
Albumin: 4.5 g/dL (ref 3.5–5.5)
Alkaline Phosphatase: 84 IU/L (ref 39–117)
BILIRUBIN TOTAL: 0.3 mg/dL (ref 0.0–1.2)
BUN/Creatinine Ratio: 24 — ABNORMAL HIGH (ref 9–20)
BUN: 14 mg/dL (ref 6–20)
CALCIUM: 10.3 mg/dL — AB (ref 8.7–10.2)
CHLORIDE: 97 mmol/L (ref 96–106)
CO2: 21 mmol/L (ref 20–29)
Creatinine, Ser: 0.58 mg/dL — ABNORMAL LOW (ref 0.76–1.27)
GFR, EST AFRICAN AMERICAN: 168 mL/min/{1.73_m2} (ref >59)
GFR, EST NON AFRICAN AMERICAN: 146 mL/min/{1.73_m2} (ref >59)
GLUCOSE: 148 mg/dL — AB (ref 65–99)
Globulin, Total: 2.4 g/dL (ref 1.5–4.5)
Potassium: 4.5 mmol/L (ref 3.5–5.2)
Sodium: 140 mmol/L (ref 134–144)
TOTAL PROTEIN: 6.9 g/dL (ref 6.0–8.5)

## 2018-11-24 LAB — LIPID PANEL
Chol/HDL Ratio: 9.7 ratio — ABNORMAL HIGH (ref 0.0–5.0)
Cholesterol, Total: 271 mg/dL — ABNORMAL HIGH (ref 100–199)
HDL: 28 mg/dL — AB (ref >39)
TRIGLYCERIDES: 921 mg/dL — AB (ref 0–149)

## 2018-11-26 MED ORDER — FENOFIBRATE 145 MG PO TABS: 145.0000 mg | ORAL_TABLET | Freq: Every day | ORAL | 5 refills | Status: DC

## 2018-11-26 NOTE — Addendum Note (Signed)
Addended by: Bennie PieriniMARTIN, MARY-MARGARET on: 11/26/2018 08:08 AM   Modules accepted: Orders

## 2019-02-25 ENCOUNTER — Encounter: Payer: Self-pay | Admitting: Nurse Practitioner

## 2019-02-25 ENCOUNTER — Encounter (INDEPENDENT_AMBULATORY_CARE_PROVIDER_SITE_OTHER): Payer: Self-pay

## 2019-02-25 ENCOUNTER — Ambulatory Visit: Payer: Medicaid Other | Admitting: Nurse Practitioner

## 2019-02-25 ENCOUNTER — Other Ambulatory Visit: Payer: Self-pay

## 2019-02-25 VITALS — BP 135/90 | HR 103 | Temp 97.5°F | Ht 66.0 in | Wt 244.0 lb

## 2019-02-25 DIAGNOSIS — F900 Attention-deficit hyperactivity disorder, predominantly inattentive type: Secondary | ICD-10-CM

## 2019-02-25 DIAGNOSIS — E1169 Type 2 diabetes mellitus with other specified complication: Secondary | ICD-10-CM | POA: Insufficient documentation

## 2019-02-25 DIAGNOSIS — R739 Hyperglycemia, unspecified: Secondary | ICD-10-CM

## 2019-02-25 DIAGNOSIS — E781 Pure hyperglyceridemia: Secondary | ICD-10-CM | POA: Diagnosis not present

## 2019-02-25 DIAGNOSIS — I10 Essential (primary) hypertension: Secondary | ICD-10-CM

## 2019-02-25 DIAGNOSIS — Z6837 Body mass index (BMI) 37.0-37.9, adult: Secondary | ICD-10-CM

## 2019-02-25 DIAGNOSIS — E785 Hyperlipidemia, unspecified: Secondary | ICD-10-CM

## 2019-02-25 DIAGNOSIS — R6250 Unspecified lack of expected normal physiological development in childhood: Secondary | ICD-10-CM | POA: Diagnosis not present

## 2019-02-25 MED ORDER — LISINOPRIL 5 MG PO TABS: 5.0000 mg | ORAL_TABLET | Freq: Every day | ORAL | 5 refills | Status: DC

## 2019-02-25 MED ORDER — LISDEXAMFETAMINE DIMESYLATE 50 MG PO CAPS: 50.0000 mg | ORAL_CAPSULE | Freq: Every day | ORAL | 0 refills | Status: DC

## 2019-02-25 MED ORDER — FENOFIBRATE 145 MG PO TABS: 145.0000 mg | ORAL_TABLET | Freq: Every day | ORAL | 5 refills | Status: DC

## 2019-02-25 NOTE — Progress Notes (Signed)
Subjective:    Patient ID: Stephen Richardson, male    DOB: 11/25/1996, 23 y.o.   MRN: [id number]   Chief Complaint: managemnet of chronic issues  HPI  - Hypertension on lisinopril daily. Os doing well without side effects. No c/o chest pain, SOB or headache. Does not check blood pressure at home.  - hypertriglyceridemia is on tricor daily- doe snot watch diet and does no exercise. - obesity- weight is up 5lbs since last visit. -ADHD -Patient brought in today by mom for follow up of ADHD. Currently taking vyvanse 57m daily. Behavior- good Grades- no longer in school Medication side effects- none Weight loss- none- actually gaining weight Sleeping habits- good Any concerns- none   [place] CSRS reviewed: Yes Any suspicious activity on [place] Csrs: No    Review of Systems  Constitutional: Negative.   Respiratory: Negative.   Cardiovascular: Negative.   Genitourinary: Negative.   Neurological: Negative.   Psychiatric/Behavioral: Negative.   All other systems reviewed and are negative.      Objective:   Physical Exam Vitals signs and nursing note reviewed.  Constitutional:      Appearance: Normal appearance. He is well-developed.  HENT:     Head: Normocephalic.     Nose: Nose normal.  Eyes:     Pupils: Pupils are equal, round, and reactive to light.  Neck:     Musculoskeletal: Normal range of motion and neck supple.     Thyroid: No thyroid mass or thyromegaly.     Vascular: No carotid bruit or JVD.     Trachea: Phonation normal.  Cardiovascular:     Rate and Rhythm: Normal rate and regular rhythm.  Pulmonary:     Effort: Pulmonary effort is normal. No respiratory distress.     Breath sounds: Normal breath sounds.  Abdominal:     General: Bowel sounds are normal.     Palpations: Abdomen is soft.     Tenderness: There is no abdominal tenderness.  Musculoskeletal: Normal range of motion.  Lymphadenopathy:     Cervical: No cervical adenopathy.  Skin:    General: Skin is  warm and dry.  Neurological:     Mental Status: He is alert and oriented to person, place, and time.  Psychiatric:        Behavior: Behavior normal.        Thought Content: Thought content normal.        Judgment: Judgment normal.      BP 135/90   Pulse (!) 103   Temp (!) 97.5 F (36.4 C) (Oral)   Ht 5' 6"  (1.676 m)   Wt 244 lb (110.7 kg)   BMI 39.38 kg/m      Assessment & Plan:  Stephen Lacountcomes in today with chief complaint of No chief complaint on file.   Diagnosis and orders addressed:  1. Essential hypertension Low sodium diet - lisinopril (PRINIVIL,ZESTRIL) 5 MG tablet; Take 1 tablet (5 mg total) by mouth daily.  Dispense: 30 tablet; Refill: 5 - CMP14+EGFR  2. BMI 37.0-37.9, adult Discussed diet and exercise for person with BMI >25 Will recheck weight in 3-6 months  3. Attention deficit hyperactivity disorder (ADHD), predominantly inattentive type Continue behavior modification - lisdexamfetamine (VYVANSE) 50 MG capsule; Take 1 capsule (50 mg total) by mouth daily for 30 days.  Dispense: 30 capsule; Refill: 0 - lisdexamfetamine (VYVANSE) 50 MG capsule; Take 1 capsule (50 mg total) by mouth daily for 30 days.  Dispense: 30 capsule; Refill: 0 - lisdexamfetamine (  VYVANSE) 50 MG capsule; Take 1 capsule (50 mg total) by mouth daily for 30 days.  Dispense: 30 capsule; Refill: 0  4. Developmental delay  5. Hypertriglyceridemia Low fat diet - fenofibrate (TRICOR) 145 MG tablet; Take 1 tablet (145 mg total) by mouth daily.  Dispense: 30 tablet; Refill: 5 - Lipid panel   Labs pending Health Maintenance reviewed Diet and exercise encouraged  Follow up plan: 3 months   [place], FNP

## 2019-02-25 NOTE — Patient Instructions (Signed)
DASH Eating Plan  DASH stands for "Dietary Approaches to Stop Hypertension." The DASH eating plan is a healthy eating plan that has been shown to reduce high blood pressure (hypertension). It may also reduce your risk for type 2 diabetes, heart disease, and stroke. The DASH eating plan may also help with weight loss.  What are tips for following this plan?    General guidelines   Avoid eating more than 2,300 mg (milligrams) of salt (sodium) a day. If you have hypertension, you may need to reduce your sodium intake to 1,500 mg a day.   Limit alcohol intake to no more than 1 drink a day for nonpregnant women and 2 drinks a day for men. One drink equals 12 oz of beer, 5 oz of wine, or 1 oz of hard liquor.   Work with your health care provider to maintain a healthy body weight or to lose weight. Ask what an ideal weight is for you.   Get at least 30 minutes of exercise that causes your heart to beat faster (aerobic exercise) most days of the week. Activities may include walking, swimming, or biking.   Work with your health care provider or diet and nutrition specialist (dietitian) to adjust your eating plan to your individual calorie needs.  Reading food labels     Check food labels for the amount of sodium per serving. Choose foods with less than 5 percent of the Daily Value of sodium. Generally, foods with less than 300 mg of sodium per serving fit into this eating plan.   To find whole grains, look for the word "whole" as the first word in the ingredient list.  Shopping   Buy products labeled as "low-sodium" or "no salt added."   Buy fresh foods. Avoid canned foods and premade or frozen meals.  Cooking   Avoid adding salt when cooking. Use salt-free seasonings or herbs instead of table salt or sea salt. Check with your health care provider or pharmacist before using salt substitutes.   Do not fry foods. Cook foods using healthy methods such as baking, boiling, grilling, and broiling instead.   Cook with  heart-healthy oils, such as olive, canola, soybean, or sunflower oil.  Meal planning   Eat a balanced diet that includes:  ? 5 or more servings of fruits and vegetables each day. At each meal, try to fill half of your plate with fruits and vegetables.  ? Up to 6-8 servings of whole grains each day.  ? Less than 6 oz of lean meat, poultry, or fish each day. A 3-oz serving of meat is about the same size as a deck of cards. One egg equals 1 oz.  ? 2 servings of low-fat dairy each day.  ? A serving of nuts, seeds, or beans 5 times each week.  ? Heart-healthy fats. Healthy fats called Omega-3 fatty acids are found in foods such as flaxseeds and coldwater fish, like sardines, salmon, and mackerel.   Limit how much you eat of the following:  ? Canned or prepackaged foods.  ? Food that is high in trans fat, such as fried foods.  ? Food that is high in saturated fat, such as fatty meat.  ? Sweets, desserts, sugary drinks, and other foods with added sugar.  ? Full-fat dairy products.   Do not salt foods before eating.   Try to eat at least 2 vegetarian meals each week.   Eat more home-cooked food and less restaurant, buffet, and fast food.     When eating at a restaurant, ask that your food be prepared with less salt or no salt, if possible.  What foods are recommended?  The items listed may not be a complete list. Talk with your dietitian about what dietary choices are best for you.  Grains  Whole-grain or whole-wheat bread. Whole-grain or whole-wheat pasta. Brown rice. Oatmeal. Quinoa. Bulgur. Whole-grain and low-sodium cereals. Pita bread. Low-fat, low-sodium crackers. Whole-wheat flour tortillas.  Vegetables  Fresh or frozen vegetables (raw, steamed, roasted, or grilled). Low-sodium or reduced-sodium tomato and vegetable juice. Low-sodium or reduced-sodium tomato sauce and tomato paste. Low-sodium or reduced-sodium canned vegetables.  Fruits  All fresh, dried, or frozen fruit. Canned fruit in natural juice (without  added sugar).  Meat and other protein foods  Skinless chicken or turkey. Ground chicken or turkey. Pork with fat trimmed off. Fish and seafood. Egg whites. Dried beans, peas, or lentils. Unsalted nuts, nut butters, and seeds. Unsalted canned beans. Lean cuts of beef with fat trimmed off. Low-sodium, lean deli meat.  Dairy  Low-fat (1%) or fat-free (skim) milk. Fat-free, low-fat, or reduced-fat cheeses. Nonfat, low-sodium ricotta or cottage cheese. Low-fat or nonfat yogurt. Low-fat, low-sodium cheese.  Fats and oils  Soft margarine without trans fats. Vegetable oil. Low-fat, reduced-fat, or light mayonnaise and salad dressings (reduced-sodium). Canola, safflower, olive, soybean, and sunflower oils. Avocado.  Seasoning and other foods  Herbs. Spices. Seasoning mixes without salt. Unsalted popcorn and pretzels. Fat-free sweets.  What foods are not recommended?  The items listed may not be a complete list. Talk with your dietitian about what dietary choices are best for you.  Grains  Baked goods made with fat, such as croissants, muffins, or some breads. Dry pasta or rice meal packs.  Vegetables  Creamed or fried vegetables. Vegetables in a cheese sauce. Regular canned vegetables (not low-sodium or reduced-sodium). Regular canned tomato sauce and paste (not low-sodium or reduced-sodium). Regular tomato and vegetable juice (not low-sodium or reduced-sodium). Pickles. Olives.  Fruits  Canned fruit in a light or heavy syrup. Fried fruit. Fruit in cream or butter sauce.  Meat and other protein foods  Fatty cuts of meat. Ribs. Fried meat. Bacon. Sausage. Bologna and other processed lunch meats. Salami. Fatback. Hotdogs. Bratwurst. Salted nuts and seeds. Canned beans with added salt. Canned or smoked fish. Whole eggs or egg yolks. Chicken or turkey with skin.  Dairy  Whole or 2% milk, cream, and half-and-half. Whole or full-fat cream cheese. Whole-fat or sweetened yogurt. Full-fat cheese. Nondairy creamers. Whipped toppings.  Processed cheese and cheese spreads.  Fats and oils  Butter. Stick margarine. Lard. Shortening. Ghee. Bacon fat. Tropical oils, such as coconut, palm kernel, or palm oil.  Seasoning and other foods  Salted popcorn and pretzels. Onion salt, garlic salt, seasoned salt, table salt, and sea salt. Worcestershire sauce. Tartar sauce. Barbecue sauce. Teriyaki sauce. Soy sauce, including reduced-sodium. Steak sauce. Canned and packaged gravies. Fish sauce. Oyster sauce. Cocktail sauce. Horseradish that you find on the shelf. Ketchup. Mustard. Meat flavorings and tenderizers. Bouillon cubes. Hot sauce and Tabasco sauce. Premade or packaged marinades. Premade or packaged taco seasonings. Relishes. Regular salad dressings.  Where to find more information:   National Heart, Lung, and Blood Institute: www.nhlbi.nih.gov   American Heart Association: www.heart.org  Summary   The DASH eating plan is a healthy eating plan that has been shown to reduce high blood pressure (hypertension). It may also reduce your risk for type 2 diabetes, heart disease, and stroke.   With the   DASH eating plan, you should limit salt (sodium) intake to 2,300 mg a day. If you have hypertension, you may need to reduce your sodium intake to 1,500 mg a day.   When on the DASH eating plan, aim to eat more fresh fruits and vegetables, whole grains, lean proteins, low-fat dairy, and heart-healthy fats.   Work with your health care provider or diet and nutrition specialist (dietitian) to adjust your eating plan to your individual calorie needs.  This information is not intended to replace advice given to you by your health care provider. Make sure you discuss any questions you have with your health care provider.  Document Released: 11/17/2011 Document Revised: 11/21/2016 Document Reviewed: 11/21/2016  Elsevier Interactive Patient Education  2019 [employer]

## 2019-02-26 LAB — LIPID PANEL
CHOLESTEROL TOTAL: 278 mg/dL — AB (ref 100–199)
Chol/HDL Ratio: 10.7 ratio — ABNORMAL HIGH (ref 0.0–5.0)
HDL: 26 mg/dL — ABNORMAL LOW (ref >39)
Triglycerides: 1004 mg/dL (ref 0–149)

## 2019-02-26 LAB — CMP14+EGFR
ALBUMIN: 4.4 g/dL (ref 4.1–5.2)
ALK PHOS: 95 IU/L (ref 39–117)
ALT: 12 IU/L (ref 0–44)
AST: 23 IU/L (ref 0–40)
Albumin/Globulin Ratio: 1.7 (ref 1.2–2.2)
BUN/Creatinine Ratio: 29 — ABNORMAL HIGH (ref 9–20)
BUN: 17 mg/dL (ref 6–20)
Bilirubin Total: 0.3 mg/dL (ref 0.0–1.2)
CO2: 17 mmol/L — ABNORMAL LOW (ref 20–29)
Calcium: 10.2 mg/dL (ref 8.7–10.2)
Chloride: 99 mmol/L (ref 96–106)
Creatinine, Ser: 0.58 mg/dL — ABNORMAL LOW (ref 0.76–1.27)
GFR calc Af Amer: 168 mL/min/{1.73_m2} (ref >59)
GFR calc non Af Amer: 146 mL/min/{1.73_m2} (ref >59)
Globulin, Total: 2.6 g/dL (ref 1.5–4.5)
Glucose: 153 mg/dL — ABNORMAL HIGH (ref 65–99)
Potassium: 4.7 mmol/L (ref 3.5–5.2)
Sodium: 137 mmol/L (ref 134–144)
Total Protein: 7 g/dL (ref 6.0–8.5)

## 2019-02-26 NOTE — Addendum Note (Signed)
Addended by: Bennie Pierini on: 02/26/2019 09:02 AM   Modules accepted: Orders

## 2019-02-27 DIAGNOSIS — R739 Hyperglycemia, unspecified: Secondary | ICD-10-CM | POA: Diagnosis not present

## 2019-02-27 LAB — BAYER DCA HB A1C WAIVED: HB A1C: 7.1 % — AB (ref <7.0)

## 2019-03-01 ENCOUNTER — Other Ambulatory Visit: Payer: Self-pay

## 2019-03-01 ENCOUNTER — Ambulatory Visit: Payer: Medicaid Other | Admitting: Nurse Practitioner

## 2019-03-01 ENCOUNTER — Encounter: Payer: Self-pay | Admitting: Nurse Practitioner

## 2019-03-01 DIAGNOSIS — E119 Type 2 diabetes mellitus without complications: Secondary | ICD-10-CM | POA: Diagnosis not present

## 2019-03-01 DIAGNOSIS — E108 Type 1 diabetes mellitus with unspecified complications: Secondary | ICD-10-CM | POA: Insufficient documentation

## 2019-03-01 MED ORDER — BLOOD GLUCOSE METER KIT: PACK | 0 refills | Status: DC

## 2019-03-01 MED ORDER — METFORMIN HCL 500 MG PO TABS: 500.0000 mg | ORAL_TABLET | Freq: Two times a day (BID) | ORAL | 5 refills | Status: DC

## 2019-03-01 NOTE — Progress Notes (Signed)
   Subjective:    Patient ID: Stephen Richardson, male    DOB: [birth year], 23 y.o.   MRN: [id number]   Chief Complaint: new diabetes  HPI Patient was seen on 02/25/2019 for routine follow up. Labs were drawn and patient blood sugar was 153 fasting. A HGBA1C was added and was 7.1%. patient has never been dx with diabetes in the past, however it does run in both sides of his family. There has been no weight gain n the last year but over a 5 year period he has gained 50lbs. He does no exercise and does not watch his diet. His mom says that he does not eat a lot of sweets but does eat a lot of bread, potatoes and pasta.    Review of Systems  Constitutional: Negative.   HENT: Negative.   Respiratory: Negative.   Cardiovascular: Negative.   Gastrointestinal: Negative.   Genitourinary: Negative.   Neurological: Negative.   Psychiatric/Behavioral: Negative.   All other systems reviewed and are negative.      Objective:   Physical Exam Vitals signs and nursing note reviewed.  Constitutional:      Appearance: Normal appearance. He is obese.  Cardiovascular:     Rate and Rhythm: Normal rate and regular rhythm.     Heart sounds: Normal heart sounds.  Pulmonary:     Breath sounds: Normal breath sounds.  Skin:    General: Skin is warm.  Neurological:     General: No focal deficit present.     Mental Status: He is alert and oriented to person, place, and time.  Psychiatric:        Mood and Affect: Mood normal.        Behavior: Behavior normal.    BP 136/89   Pulse 98   Temp (!) 97.2 F (36.2 C) (Oral)   Ht '5\' 6"'$  (1.676 m)   Wt 247 lb (112 kg)   BMI 39.87 kg/m         Assessment & Plan:  Stephen Richardson in today with chief complaint of No chief complaint on file.   1. Diabetes mellitus without complication ([place]) Discussed carb counting- keep diary of foods Discussed exercise Check blood sugars fasting every morning and keep diary Follow up in 1 month  Meds ordered this  encounter  Medications  . blood glucose meter kit and supplies    Sig: Dispense based on patient and insurance preference. Use up to four times daily as directed. (FOR ICD-10 E10.9, E11.9).    Dispense:  1 each    Refill:  0    Order Specific Question:   Number of strips    Answer:   100    Order Specific Question:   Number of lancets    Answer:   100  . metFORMIN (GLUCOPHAGE) 500 MG tablet    Sig: Take 1 tablet (500 mg total) by mouth 2 (two) times daily with a meal.    Dispense:  60 tablet    Refill:  5    Order Specific Question:   Supervising Provider    Answer:   Caryl Pina A [[id number]]   Follow up in 1 month  [place], FNP

## 2019-03-01 NOTE — Patient Instructions (Signed)
Diabetes Mellitus and Nutrition, Adult  When you have diabetes (diabetes mellitus), it is very important to have healthy eating habits because your blood sugar (glucose) levels are greatly affected by what you eat and drink. Eating healthy foods in the appropriate amounts, at about the same times every day, can help you:  · Control your blood glucose.  · Lower your risk of heart disease.  · Improve your blood pressure.  · Reach or maintain a healthy weight.  Every person with diabetes is different, and each person has different needs for a meal plan. Your health care provider may recommend that you work with a diet and nutrition specialist (dietitian) to make a meal plan that is best for you. Your meal plan may vary depending on factors such as:  · The calories you need.  · The medicines you take.  · Your weight.  · Your blood glucose, blood pressure, and cholesterol levels.  · Your activity level.  · Other health conditions you have, such as heart or kidney disease.  How do carbohydrates affect me?  Carbohydrates, also called carbs, affect your blood glucose level more than any other type of food. Eating carbs naturally raises the amount of glucose in your blood. Carb counting is a method for keeping track of how many carbs you eat. Counting carbs is important to keep your blood glucose at a healthy level, especially if you use insulin or take certain oral diabetes medicines.  It is important to know how many carbs you can safely have in each meal. This is different for every person. Your dietitian can help you calculate how many carbs you should have at each meal and for each snack.  Foods that contain carbs include:  · Bread, cereal, rice, pasta, and crackers.  · Potatoes and corn.  · Peas, beans, and lentils.  · Milk and yogurt.  · Fruit and juice.  · Desserts, such as cakes, cookies, ice cream, and candy.  How does alcohol affect me?  Alcohol can cause a sudden decrease in blood glucose (hypoglycemia),  especially if you use insulin or take certain oral diabetes medicines. Hypoglycemia can be a life-threatening condition. Symptoms of hypoglycemia (sleepiness, dizziness, and confusion) are similar to symptoms of having too much alcohol.  If your health care provider says that alcohol is safe for you, follow these guidelines:  · Limit alcohol intake to no more than 1 drink per day for nonpregnant women and 2 drinks per day for men. One drink equals 12 oz of beer, 5 oz of wine, or 1½ oz of hard liquor.  · Do not drink on an empty stomach.  · Keep yourself hydrated with water, diet soda, or unsweetened iced tea.  · Keep in mind that regular soda, juice, and other mixers may contain a lot of sugar and must be counted as carbs.  What are tips for following this plan?    Reading food labels  · Start by checking the serving size on the "Nutrition Facts" label of packaged foods and drinks. The amount of calories, carbs, fats, and other nutrients listed on the label is based on one serving of the item. Many items contain more than one serving per package.  · Check the total grams (g) of carbs in one serving. You can calculate the number of servings of carbs in one serving by dividing the total carbs by 15. For example, if a food has 30 g of total carbs, it would be equal to 2   servings of carbs.  · Check the number of grams (g) of saturated and trans fats in one serving. Choose foods that have low or no amount of these fats.  · Check the number of milligrams (mg) of salt (sodium) in one serving. Most people should limit total sodium intake to less than 2,300 mg per day.  · Always check the nutrition information of foods labeled as "low-fat" or "nonfat". These foods may be higher in added sugar or refined carbs and should be avoided.  · Talk to your dietitian to identify your daily goals for nutrients listed on the label.  Shopping  · Avoid buying canned, premade, or processed foods. These foods tend to be high in fat, sodium,  and added sugar.  · Shop around the outside edge of the grocery store. This includes fresh fruits and vegetables, bulk grains, fresh meats, and fresh dairy.  Cooking  · Use low-heat cooking methods, such as baking, instead of high-heat cooking methods like deep frying.  · Cook using healthy oils, such as olive, canola, or sunflower oil.  · Avoid cooking with butter, cream, or high-fat meats.  Meal planning  · Eat meals and snacks regularly, preferably at the same times every day. Avoid going long periods of time without eating.  · Eat foods high in fiber, such as fresh fruits, vegetables, beans, and whole grains. Talk to your dietitian about how many servings of carbs you can eat at each meal.  · Eat 4-6 ounces (oz) of lean protein each day, such as lean meat, chicken, fish, eggs, or tofu. One oz of lean protein is equal to:  ? 1 oz of meat, chicken, or fish.  ? 1 egg.  ? ¼ cup of tofu.  · Eat some foods each day that contain healthy fats, such as avocado, nuts, seeds, and fish.  Lifestyle  · Check your blood glucose regularly.  · Exercise regularly as told by your health care provider. This may include:  ? 150 minutes of moderate-intensity or vigorous-intensity exercise each week. This could be brisk walking, biking, or water aerobics.  ? Stretching and doing strength exercises, such as yoga or weightlifting, at least 2 times a week.  · Take medicines as told by your health care provider.  · Do not use any products that contain nicotine or tobacco, such as cigarettes and e-cigarettes. If you need help quitting, ask your health care provider.  · Work with a counselor or diabetes educator to identify strategies to manage stress and any emotional and social challenges.  Questions to ask a health care provider  · Do I need to meet with a diabetes educator?  · Do I need to meet with a dietitian?  · What number can I call if I have questions?  · When are the best times to check my blood glucose?  Where to find more  information:  · American Diabetes Association: diabetes.org  · Academy of Nutrition and Dietetics: www.eatright.org  · National Institute of Diabetes and Digestive and Kidney Diseases (NIH): www.niddk.nih.gov  Summary  · A healthy meal plan will help you control your blood glucose and maintain a healthy lifestyle.  · Working with a diet and nutrition specialist (dietitian) can help you make a meal plan that is best for you.  · Keep in mind that carbohydrates (carbs) and alcohol have immediate effects on your blood glucose levels. It is important to count carbs and to use alcohol carefully.  This information is not intended to   replace advice given to you by your health care provider. Make sure you discuss any questions you have with your health care provider.  Document Released: 08/25/2005 Document Revised: 06/28/2017 Document Reviewed: 01/02/2017  Elsevier Interactive Patient Education © 2019 [employer]

## 2019-04-08 ENCOUNTER — Other Ambulatory Visit: Payer: Self-pay

## 2019-04-08 ENCOUNTER — Encounter: Payer: Self-pay | Admitting: Nurse Practitioner

## 2019-04-08 ENCOUNTER — Ambulatory Visit (INDEPENDENT_AMBULATORY_CARE_PROVIDER_SITE_OTHER): Payer: Medicaid Other | Admitting: Nurse Practitioner

## 2019-04-08 DIAGNOSIS — E785 Hyperlipidemia, unspecified: Secondary | ICD-10-CM | POA: Diagnosis not present

## 2019-04-08 DIAGNOSIS — E1169 Type 2 diabetes mellitus with other specified complication: Secondary | ICD-10-CM

## 2019-04-08 DIAGNOSIS — E119 Type 2 diabetes mellitus without complications: Secondary | ICD-10-CM

## 2019-04-08 DIAGNOSIS — Z6837 Body mass index (BMI) 37.0-37.9, adult: Secondary | ICD-10-CM

## 2019-04-08 DIAGNOSIS — R6250 Unspecified lack of expected normal physiological development in childhood: Secondary | ICD-10-CM

## 2019-04-08 DIAGNOSIS — I10 Essential (primary) hypertension: Secondary | ICD-10-CM

## 2019-04-08 DIAGNOSIS — F900 Attention-deficit hyperactivity disorder, predominantly inattentive type: Secondary | ICD-10-CM

## 2019-04-08 NOTE — Progress Notes (Signed)
Patient ID: Stephen Richardson, male   DOB: Aug 29, 1996, 23 y.o.   MRN: [id number]    Virtual Visit via telephone Note  I connected with Stephen Richardson on 04/08/19 at 11:50 AM by telephone and verified that I am speaking with the correct person using two identifiers. Stephen Richardson is currently located at home and his mom is currently with her during visit. The provider, Mary-Margaret Hassell Done, FNP is located in their office at time of visit.  I discussed the limitations, risks, security and privacy concerns of performing an evaluation and management service by telephone and the availability of in person appointments. I also discussed with the patient that there may be a patient responsible charge related to this service. The patient expressed understanding and agreed to proceed.   History and Present Illness:   Chief Complaint: medical management of chronic illnesses  HPI:  1. Essential hypertension No c/o chest pain, sob or headache. Does not chek blood pressure at home. BP Readings from Last 3 Encounters:  03/01/19 136/89  02/25/19 135/90  11/23/18 132/80    2. Hyperlipidemia associated with type 2 diabetes mellitus ([place]) Has been trying to watch diet. Still no exercise.  3. Diabetes mellitus without complication ([place]) Was just diagnosed with diabetes 02/27/19. Blood sugars have been running around 120-140 most of time. He is taking his metformin BID and has had some diarrhea.   4. Attention deficit hyperactivity disorder (ADHD), predominantly inattentive type He is on vyvanse 45m daily- he cannot stay calm and relax without meds  5. Developmental delay He is not able to take care of himself. He is mo longer in school because he aged out.  6. BMI 37.0-37.9, adult His weight is down 7lbs since last visit.    Outpatient Encounter Medications as of 04/08/2019  Medication Sig  . blood glucose meter kit and supplies Dispense based on patient and insurance preference. Use up to four  times daily as directed. (FOR ICD-10 E10.9, E11.9).  . fenofibrate (TRICOR) 145 MG tablet Take 1 tablet (145 mg total) by mouth daily.  .Derrill MemoON 04/26/2019] lisdexamfetamine (VYVANSE) 50 MG capsule Take 1 capsule (50 mg total) by mouth daily for 30 days.  .Marland Kitchenlisdexamfetamine (VYVANSE) 50 MG capsule Take 1 capsule (50 mg total) by mouth daily for 30 days.  .Marland Kitchenlisdexamfetamine (VYVANSE) 50 MG capsule Take 1 capsule (50 mg total) by mouth daily for 30 days.  .Marland Kitchenlisinopril (PRINIVIL,ZESTRIL) 5 MG tablet Take 1 tablet (5 mg total) by mouth daily.  . metFORMIN (GLUCOPHAGE) 500 MG tablet Take 1 tablet (500 mg total) by mouth 2 (two) times daily with a meal.      New complaints: None today  Social history: Lives with his mom       Review of Systems  Constitutional: Negative for diaphoresis and weight loss.  Eyes: Negative for blurred vision, double vision and pain.  Respiratory: Negative for shortness of breath.   Cardiovascular: Negative for chest pain, palpitations, orthopnea and leg swelling.  Gastrointestinal: Negative for abdominal pain.  Skin: Negative for rash.  Neurological: Negative for dizziness, sensory change, loss of consciousness, weakness and headaches.  Endo/Heme/Allergies: Negative for polydipsia. Does not bruise/bleed easily.  Psychiatric/Behavioral: Negative for memory loss. The patient does not have insomnia.   All other systems reviewed and are negative.    Observations/Objective: Alert and oriented- answers all questions apprpriately  Assessment and Plan: JSha Amercomes in today with chief complaint of Medical Management of Chronic Issues   Diagnosis and  orders addressed:  1. Essential hypertension Low salt diet reviewed  2. Hyperlipidemia associated with type 2 diabetes mellitus (HCC) Low fat diet Encouraged daily exercise  3. Diabetes mellitus without complication ([place]) Continue to wtach carbs in diet and keep daily check of blood sugars  4.  Attention deficit hyperactivity disorder (ADHD), predominantly inattentive type Does not need vyvanse filled right now  5. Developmental delay  6. BMI 37.0-37.9, adult Continue to watch diet  Previous lab results reviewed Health Maintenance reviewed Diet and exercise encouraged  Follow up plan: 1 month      I discussed the assessment and treatment plan with the patient. The patient was provided an opportunity to ask questions and all were answered. The patient agreed with the plan and demonstrated an understanding of the instructions.   The patient was advised to call back or seek an in-person evaluation if the symptoms worsen or if the condition fails to improve as anticipated.  The above assessment and management plan was discussed with the patient. The patient verbalized understanding of and has agreed to the management plan. Patient is aware to call the clinic if symptoms persist or worsen. Patient is aware when to return to the clinic for a follow-up visit. Patient educated on when it is appropriate to go to the emergency department.    I provided 12 minutes of non-face-to-face time during this encounter.    Mary-Margaret Hassell Done, FNP

## 2019-04-30 ENCOUNTER — Other Ambulatory Visit: Payer: Self-pay

## 2019-04-30 ENCOUNTER — Encounter: Payer: Self-pay | Admitting: Nurse Practitioner

## 2019-04-30 ENCOUNTER — Ambulatory Visit (INDEPENDENT_AMBULATORY_CARE_PROVIDER_SITE_OTHER): Payer: Medicaid Other | Admitting: Nurse Practitioner

## 2019-04-30 DIAGNOSIS — F900 Attention-deficit hyperactivity disorder, predominantly inattentive type: Secondary | ICD-10-CM

## 2019-04-30 DIAGNOSIS — E119 Type 2 diabetes mellitus without complications: Secondary | ICD-10-CM

## 2019-04-30 DIAGNOSIS — E1169 Type 2 diabetes mellitus with other specified complication: Secondary | ICD-10-CM

## 2019-04-30 DIAGNOSIS — Z6837 Body mass index (BMI) 37.0-37.9, adult: Secondary | ICD-10-CM | POA: Diagnosis not present

## 2019-04-30 DIAGNOSIS — I10 Essential (primary) hypertension: Secondary | ICD-10-CM

## 2019-04-30 DIAGNOSIS — R6250 Unspecified lack of expected normal physiological development in childhood: Secondary | ICD-10-CM | POA: Diagnosis not present

## 2019-04-30 DIAGNOSIS — E785 Hyperlipidemia, unspecified: Secondary | ICD-10-CM

## 2019-04-30 MED ORDER — LISDEXAMFETAMINE DIMESYLATE 50 MG PO CAPS: 50.0000 mg | ORAL_CAPSULE | Freq: Every day | ORAL | 0 refills | Status: DC

## 2019-04-30 NOTE — Progress Notes (Signed)
Patient ID: Stephen Richardson, male   DOB: [birth year], 23 y.o.   MRN: [id number]    Virtual Visit via telephone Note  I connected with Bonna Gains on 04/30/19 at 1:50PM by telephone and verified that I am speaking with the correct person using two identifiers. Cartel Finken is currently located at home and his mom is currently with her during visit. The provider, Mary-Margaret Hassell Done, FNP is located in their office at time of visit.  I discussed the limitations, risks, security and privacy concerns of performing an evaluation and management service by telephone and the availability of in person appointments. I also discussed with the patient that there may be a patient responsible charge related to this service. The patient expressed understanding and agreed to proceed.   History and Present Illness:   Chief Complaint: medical management of chronic issues   HPI:  1. Essential hypertension No c/o chest pain, sob or headache. Does not check blood pressure at home. BP Readings from Last 3 Encounters:  03/01/19 136/89  02/25/19 135/90  11/23/18 132/80     2. Hyperlipidemia associated with type 2 diabetes mellitus ([place]) Watching diet some. Does very little exercise.  3. Diabetes mellitus without complication (HCC) [id number] was 7.1. his fasting blood sugrs have been running 120-160. denies any low blood sugars. Has been trying to watch diet and has been riding his bike some everyday.  4. Attention deficit hyperactivity disorder (ADHD), predominantly inattentive type Is on vyanse daily and does not function well without it. Is not able to concentrate without meds..  5. Developmental delay He is not able to live on his own or manage money  6. BMI 37.0-37.9, adult No recent weight changes.    Outpatient Encounter Medications as of 04/30/2019  Medication Sig  . blood glucose meter kit and supplies Dispense based on patient and insurance preference. Use up to four times daily as  directed. (FOR ICD-10 E10.9, E11.9).  . fenofibrate (TRICOR) 145 MG tablet Take 1 tablet (145 mg total) by mouth daily.  Marland Kitchen lisdexamfetamine (VYVANSE) 50 MG capsule Take 1 capsule (50 mg total) by mouth daily for 30 days.  Marland Kitchen lisdexamfetamine (VYVANSE) 50 MG capsule Take 1 capsule (50 mg total) by mouth daily for 30 days.  Marland Kitchen lisdexamfetamine (VYVANSE) 50 MG capsule Take 1 capsule (50 mg total) by mouth daily for 30 days.  Marland Kitchen lisinopril (PRINIVIL,ZESTRIL) 5 MG tablet Take 1 tablet (5 mg total) by mouth daily.  . metFORMIN (GLUCOPHAGE) 500 MG tablet Take 1 tablet (500 mg total) by mouth 2 (two) times daily with a meal.      New complaints: None today  Social history: Traveling out [place] next week and will not be back until august      Review of Systems  Constitutional: Negative for diaphoresis and weight loss.  Eyes: Negative for blurred vision, double vision and pain.  Respiratory: Negative for shortness of breath.   Cardiovascular: Negative for chest pain, palpitations, orthopnea and leg swelling.  Gastrointestinal: Negative for abdominal pain.  Skin: Negative for rash.  Neurological: Negative for dizziness, sensory change, loss of consciousness, weakness and headaches.  Endo/Heme/Allergies: Negative for polydipsia. Does not bruise/bleed easily.  Psychiatric/Behavioral: Negative for memory loss. The patient does not have insomnia.   All other systems reviewed and are negative.    Observations/Objective: Alert and oriented- answers all questions appropriately  Assessment and Plan: Gerry Blanchfield comes in today with chief complaint of No chief complaint on file.   Diagnosis and orders addressed:  1. Essential hypertension Low sodiumdiet  2. Hyperlipidemia associated with type 2 diabetes mellitus (HCC) Low fat diet  3. Diabetes mellitus without complication (HCC) Strict carb counting exercise  4. Attention deficit hyperactivity disorder (ADHD), predominantly inattentive  type Stress management - lisdexamfetamine (VYVANSE) 50 MG capsule; Take 1 capsule (50 mg total) by mouth daily for 30 days.  Dispense: 30 capsule; Refill: 0 - lisdexamfetamine (VYVANSE) 50 MG capsule; Take 1 capsule (50 mg total) by mouth daily for 30 days.  Dispense: 30 capsule; Refill: 0 - lisdexamfetamine (VYVANSE) 50 MG capsule; Take 1 capsule (50 mg total) by mouth daily for 30 days.  Dispense: 30 capsule; Refill: 0  5. Developmental delay  6. BMI 37.0-37.9, adult Discussed diet and exercise for person with BMI >25 Will recheck weight in 3-6 months    Previous lab results reviewed Health Maintenance reviewed Diet and exercise encouraged  Follow up plan: 3 months     I discussed the assessment and treatment plan with the patient. The patient was provided an opportunity to ask questions and all were answered. The patient agreed with the plan and demonstrated an understanding of the instructions.   The patient was advised to call back or seek an in-person evaluation if the symptoms worsen or if the condition fails to improve as anticipated.  The above assessment and management plan was discussed with the patient. The patient verbalized understanding of and has agreed to the management plan. Patient is aware to call the clinic if symptoms persist or worsen. Patient is aware when to return to the clinic for a follow-up visit. Patient educated on when it is appropriate to go to the emergency department.   Time call ended:  2:05  I provided 15 minutes of non-face-to-face time during this encounter.    Mary-Margaret Hassell Done, FNP

## 2019-05-01 ENCOUNTER — Other Ambulatory Visit: Payer: Self-pay | Admitting: Nurse Practitioner

## 2019-05-31 IMAGING — DX DG CHEST 2V
2 series · 2 of 2 positions shown · non-contrast
Comparison: Chest radiograph 04/26/2016

CLINICAL DATA: Fever and cough

EXAM:
CHEST  2 VIEW

[chest pa]
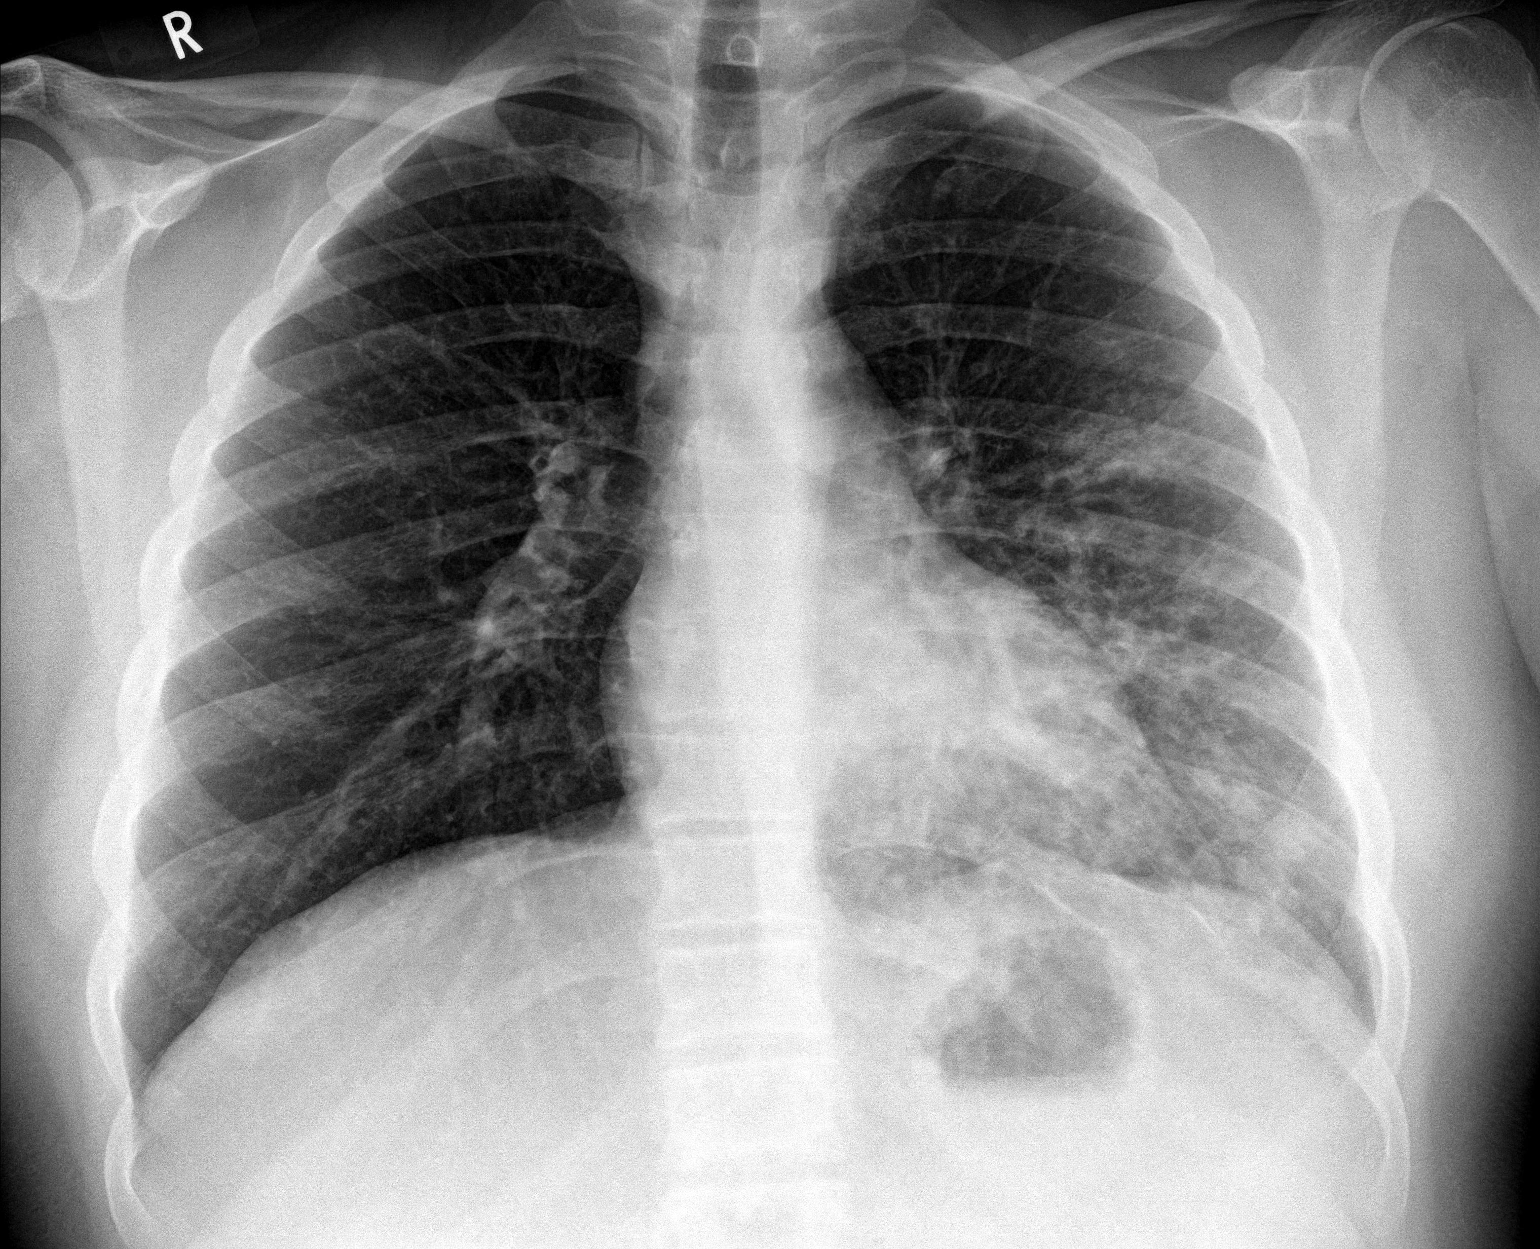

[chest lat]
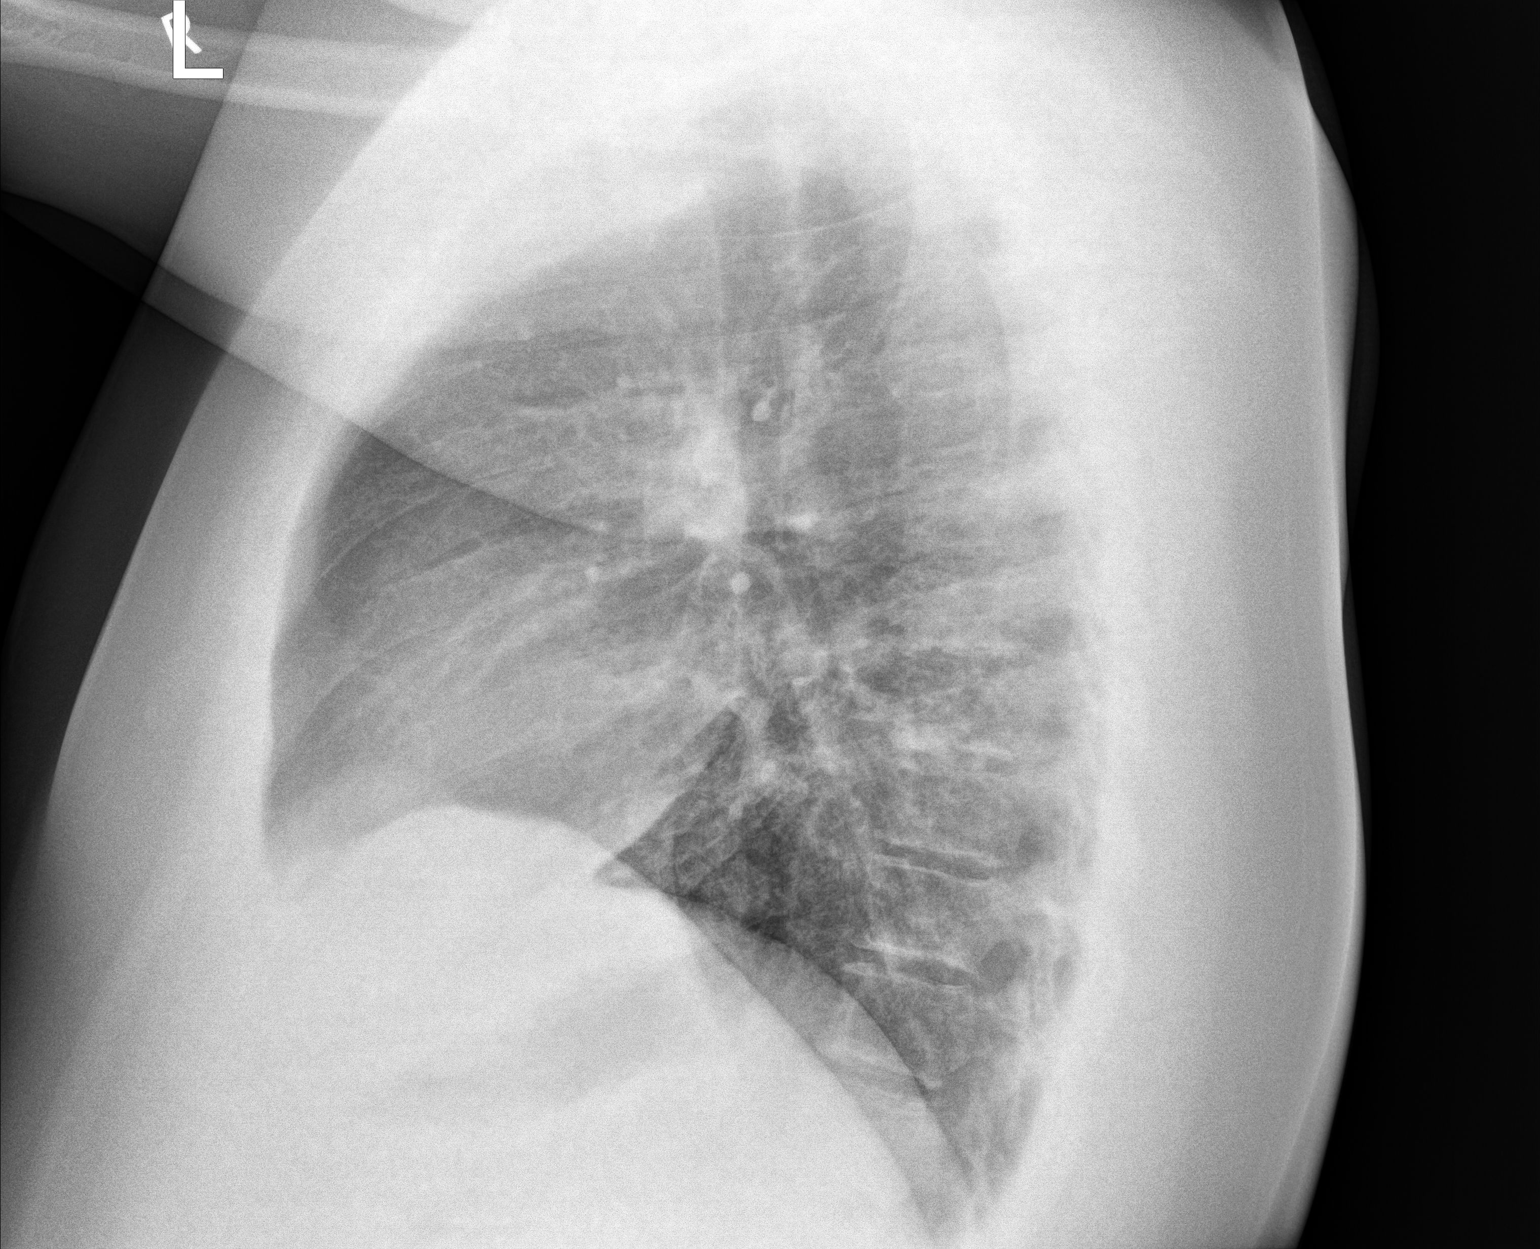

[2 of 2 positions shown; findings below may reference images not displayed]

FINDINGS: There is increased parenchymal opacity throughout the left lower
lobe. The remainder of the lungs is clear. No pleural effusion or
pneumothorax. Normal cardiomediastinal contours.
IMPRESSION: Left lower lobe pneumonia.

## 2019-06-03 ENCOUNTER — Ambulatory Visit: Payer: Medicaid Other | Admitting: Nurse Practitioner

## 2019-06-03 ENCOUNTER — Other Ambulatory Visit: Payer: Self-pay

## 2019-08-13 ENCOUNTER — Ambulatory Visit (INDEPENDENT_AMBULATORY_CARE_PROVIDER_SITE_OTHER): Payer: Medicaid Other | Admitting: Nurse Practitioner

## 2019-08-13 ENCOUNTER — Encounter: Payer: Self-pay | Admitting: Nurse Practitioner

## 2019-08-13 DIAGNOSIS — K640 First degree hemorrhoids: Secondary | ICD-10-CM

## 2019-08-13 NOTE — Progress Notes (Signed)
   Virtual Visit via telephone Note Due to COVID-19 pandemic this visit was conducted virtually. This visit type was conducted due to national recommendations for restrictions regarding the COVID-19 Pandemic (e.g. social distancing, sheltering in place) in an effort to limit this patient's exposure and mitigate transmission in our community. All issues noted in this document were discussed and addressed.  A physical exam was not performed with this format.  I connected with Stephen Richardson on 08/13/19 at 2:35 by telephone and verified that I am speaking with the correct person using two identifiers. Stephen Richardson is currently located at home and his mom is currently with him during visit. The provider, Mary-Margaret Hassell Done, FNP is located in their office at time of visit.  I discussed the limitations, risks, security and privacy concerns of performing an evaluation and management service by telephone and the availability of in person appointments. I also discussed with the patient that there may be a patient responsible charge related to this service. The patient expressed understanding and agreed to proceed.   History and Present Illness:   Chief Complaint: Hemorrhoids   HPI patient and his mom call in c/o hemorrhoids bleeding. It hurts to have a bowel movement. He says he has been straining to go to the restroom. They are very painful.    Review of Systems  Constitutional: Negative for diaphoresis and weight loss.  Eyes: Negative for blurred vision, double vision and pain.  Respiratory: Negative for shortness of breath.   Cardiovascular: Negative for chest pain, palpitations, orthopnea and leg swelling.  Gastrointestinal: Negative for abdominal pain.  Skin: Negative for rash.  Neurological: Negative for dizziness, sensory change, loss of consciousness, weakness and headaches.  Endo/Heme/Allergies: Negative for polydipsia. Does not bruise/bleed easily.  Psychiatric/Behavioral: Negative  for memory loss. The patient does not have insomnia.   All other systems reviewed and are negative.    Observations/Objective: Alert and oriented- answers all questions appropriately No distress    Assessment and Plan: Stephen Richardson in today with chief complaint of Hemorrhoids   1. Grade I hemorrhoids miralax OTC daily in apple juice Preparation H  Force fluids   Follow Up Instructions: prn    I discussed the assessment and treatment plan with the patient. The patient was provided an opportunity to ask questions and all were answered. The patient agreed with the plan and demonstrated an understanding of the instructions.   The patient was advised to call back or seek an in-person evaluation if the symptoms worsen or if the condition fails to improve as anticipated.  The above assessment and management plan was discussed with the patient. The patient verbalized understanding of and has agreed to the management plan. Patient is aware to call the clinic if symptoms persist or worsen. Patient is aware when to return to the clinic for a follow-up visit. Patient educated on when it is appropriate to go to the emergency department.   Time call ended:  2:45  I provided 10 minutes of non-face-to-face time during this encounter.    Mary-Margaret Hassell Done, FNP

## 2019-09-30 ENCOUNTER — Ambulatory Visit (INDEPENDENT_AMBULATORY_CARE_PROVIDER_SITE_OTHER): Payer: Medicaid Other | Admitting: Nurse Practitioner

## 2019-09-30 ENCOUNTER — Encounter: Payer: Self-pay | Admitting: Nurse Practitioner

## 2019-09-30 DIAGNOSIS — E785 Hyperlipidemia, unspecified: Secondary | ICD-10-CM | POA: Diagnosis not present

## 2019-09-30 DIAGNOSIS — F900 Attention-deficit hyperactivity disorder, predominantly inattentive type: Secondary | ICD-10-CM

## 2019-09-30 DIAGNOSIS — E119 Type 2 diabetes mellitus without complications: Secondary | ICD-10-CM | POA: Diagnosis not present

## 2019-09-30 DIAGNOSIS — E1169 Type 2 diabetes mellitus with other specified complication: Secondary | ICD-10-CM | POA: Diagnosis not present

## 2019-09-30 DIAGNOSIS — Z23 Encounter for immunization: Secondary | ICD-10-CM

## 2019-09-30 DIAGNOSIS — I10 Essential (primary) hypertension: Secondary | ICD-10-CM | POA: Diagnosis not present

## 2019-09-30 DIAGNOSIS — Z6837 Body mass index (BMI) 37.0-37.9, adult: Secondary | ICD-10-CM | POA: Diagnosis not present

## 2019-09-30 DIAGNOSIS — R6250 Unspecified lack of expected normal physiological development in childhood: Secondary | ICD-10-CM

## 2019-09-30 MED ORDER — LISDEXAMFETAMINE DIMESYLATE 50 MG PO CAPS: 50.0000 mg | ORAL_CAPSULE | Freq: Every day | ORAL | 0 refills | Status: DC

## 2019-09-30 MED ORDER — FENOFIBRATE 145 MG PO TABS: 145.0000 mg | ORAL_TABLET | Freq: Every day | ORAL | 5 refills | Status: DC

## 2019-09-30 MED ORDER — LISINOPRIL 5 MG PO TABS: 5.0000 mg | ORAL_TABLET | Freq: Every day | ORAL | 5 refills | Status: DC

## 2019-09-30 MED ORDER — METFORMIN HCL 500 MG PO TABS: 500.0000 mg | ORAL_TABLET | Freq: Two times a day (BID) | ORAL | 5 refills | Status: DC

## 2019-09-30 NOTE — Progress Notes (Signed)
Virtual Visit via telephone Note Due to COVID-19 pandemic this visit was conducted virtually. This visit type was conducted due to national recommendations for restrictions regarding the COVID-19 Pandemic (e.g. social distancing, sheltering in place) in an effort to limit this patient's exposure and mitigate transmission in our community. All issues noted in this document were discussed and addressed.  A physical exam was not performed with this format.  I connected with Stephen Richardson on 09/30/19 at 10:15 by telephone and verified that I am speaking with the correct person using two identifiers. Stephen Richardson is currently located at home and his mom is currently with him during visit. The provider, Mary-Margaret Hassell Done, FNP is located in their office at time of visit.  I discussed the limitations, risks, security and privacy concerns of performing an evaluation and management service by telephone and the availability of in person appointments. I also discussed with the patient that there may be a patient responsible charge related to this service. The patient expressed understanding and agreed to proceed.   History and Present Illness:   Chief Complaint: Medical Management of Chronic Issues    HPI:  1. Essential hypertension No c/o chest pain ,sob or headache. doesnot check blood pressure at home. BP Readings from Last 3 Encounters:  03/01/19 136/89  02/25/19 135/90  11/23/18 132/80     2. Hyperlipidemia associated with type 2 diabetes mellitus ([place]) Does not watch diet and does no exercise. Lab Results  Component Value Date   CHOL 278 (H) 02/25/2019   HDL 26 (L) 02/25/2019   LDLCALC Comment 02/25/2019   TRIG 1,004 (HH) 02/25/2019   CHOLHDL 10.7 (H) 02/25/2019      3. Diabetes mellitus without complication ([place]) His fsating blood sugars are fluctuating from 110-150. denies any ow blood sugars. Lab Results  Component Value Date   HGBA1C 7.1 (H) 02/27/2019     4.  Attention deficit hyperactivity disorder (ADHD), predominantly inattentive type Is on vyvanse to sty focused on task. When he doe not take he is s hyper that he cannot get anything done.  5. Developmental delay *can no longer go to school  6. BMI 37.0-37.9, adult His weight is up some according to his mom. Wt Readings from Last 3 Encounters:  03/01/19 247 lb (112 kg)  02/25/19 244 lb (110.7 kg)  11/23/18 239 lb (108.4 kg)   BMI Readings from Last 3 Encounters:  03/01/19 39.87 kg/m  02/25/19 39.38 kg/m  11/23/18 38.58 kg/m      Outpatient Encounter Medications as of 09/30/2019  Medication Sig  . Blood Glucose Monitoring Suppl (ACCU-CHEK GUIDE) w/Device KIT 1 kit by Other route 4 (four) times daily as needed. Dx E11.9  . fenofibrate (TRICOR) 145 MG tablet Take 1 tablet (145 mg total) by mouth daily.  Marland Kitchen lisdexamfetamine (VYVANSE) 50 MG capsule Take 1 capsule (50 mg total) by mouth daily for 30 days.  Marland Kitchen lisdexamfetamine (VYVANSE) 50 MG capsule Take 1 capsule (50 mg total) by mouth daily for 30 days.  Marland Kitchen lisdexamfetamine (VYVANSE) 50 MG capsule Take 1 capsule (50 mg total) by mouth daily for 30 days.  Marland Kitchen lisinopril (PRINIVIL,ZESTRIL) 5 MG tablet Take 1 tablet (5 mg total) by mouth daily.  . metFORMIN (GLUCOPHAGE) 500 MG tablet Take 1 tablet (500 mg total) by mouth 2 (two) times daily with a meal.    History reviewed. No pertinent surgical history.  Family History  Problem Relation Age of Onset  . Kidney disease Father   . Diabetes  Father   . Pancreatitis Father     New complaints: None today  Social history: Lives with his mom.  Controlled substance contract: will get signed at next face to face visit.     Review of Systems  Constitutional: Negative for diaphoresis and weight loss.  Eyes: Negative for blurred vision, double vision and pain.  Respiratory: Negative for shortness of breath.   Cardiovascular: Negative for chest pain, palpitations, orthopnea and leg  swelling.  Gastrointestinal: Negative for abdominal pain.  Skin: Negative for rash.  Neurological: Negative for dizziness, sensory change, loss of consciousness, weakness and headaches.  Endo/Heme/Allergies: Negative for polydipsia. Does not bruise/bleed easily.  Psychiatric/Behavioral: Negative for memory loss. The patient does not have insomnia.   All other systems reviewed and are negative.    Observations/Objective: Alert and oriented- answers all questions appropriately No distress    Assessment and Plan: Stephen Richardson comes in today with chief complaint of Medical Management of Chronic Issues   Diagnosis and orders addressed:  1. Essential hypertension Low sodium diet - CMP14+EGFR - lisinopril (ZESTRIL) 5 MG tablet; Take 1 tablet (5 mg total) by mouth daily.  Dispense: 30 tablet; Refill: 5  2. Hyperlipidemia associated with type 2 diabetes mellitus ([facility]) Low fat diet - Lipid panel Discussed exercise plan with his mom - fenofibrate (TRICOR) 145 MG tablet; Take 1 tablet (145 mg total) by mouth daily.  Dispense: 30 tablet; Refill: 5   3. Diabetes mellitus without complication ([facility]) Low carb diet - Bayer DCA Hb A1c Waived - metFORMIN (GLUCOPHAGE) 500 MG tablet; Take 1 tablet (500 mg total) by mouth 2 (two) times daily with a meal.  Dispense: 60 tablet; Refill: 5  4. Attention deficit hyperactivity disorder (ADHD), predominantly inattentive type - lisdexamfetamine (VYVANSE) 50 MG capsule; Take 1 capsule (50 mg total) by mouth daily.  Dispense: 30 capsule; Refill: 0 - lisdexamfetamine (VYVANSE) 50 MG capsule; Take 1 capsule (50 mg total) by mouth daily.  Dispense: 30 capsule; Refill: 0 - lisdexamfetamine (VYVANSE) 50 MG capsule; Take 1 capsule (50 mg total) by mouth daily.  Dispense: 30 capsule; Refill: 0  5. Developmental delay Discussed getting him involved in some community service  6. BMI 37.0-37.9, adult Discussed diet and exercise for person with BMI >25 Will  recheck weight in 3-6 months   Labs pending Health Maintenance reviewed Diet and exercise encouraged  Follow up plan: 3 months    I discussed the assessment and treatment plan with the patient. The patient was provided an opportunity to ask questions and all were answered. The patient agreed with the plan and demonstrated an understanding of the instructions.   The patient was advised to call back or seek an in-person evaluation if the symptoms worsen or if the condition fails to improve as anticipated.  The above assessment and management plan was discussed with the patient. The patient verbalized understanding of and has agreed to the management plan. Patient is aware to call the clinic if symptoms persist or worsen. Patient is aware when to return to the clinic for a follow-up visit. Patient educated on when it is appropriate to go to the emergency department.   Time call ended:  10:30  I provided 15 minutes of non-face-to-face time during this encounter.    Mary-Margaret Hassell Done, FNP

## 2019-10-01 ENCOUNTER — Other Ambulatory Visit: Payer: Self-pay

## 2019-10-01 DIAGNOSIS — E119 Type 2 diabetes mellitus without complications: Secondary | ICD-10-CM

## 2019-10-01 LAB — LDL CHOLESTEROL, DIRECT: LDL Direct: 18 mg/dL (ref 0–99)

## 2019-10-01 LAB — CMP14+EGFR
ALT: 14 IU/L (ref 0–44)
AST: 21 IU/L (ref 0–40)
Albumin/Globulin Ratio: 1.6 (ref 1.2–2.2)
Albumin: 4.2 g/dL (ref 4.1–5.2)
Alkaline Phosphatase: 125 IU/L — ABNORMAL HIGH (ref 39–117)
BUN/Creatinine Ratio: 18 (ref 9–20)
BUN: 12 mg/dL (ref 6–20)
Bilirubin Total: 0.3 mg/dL (ref 0.0–1.2)
CO2: 20 mmol/L (ref 20–29)
Calcium: 8.6 mg/dL — ABNORMAL LOW (ref 8.7–10.2)
Chloride: 90 mmol/L — ABNORMAL LOW (ref 96–106)
Creatinine, Ser: 0.66 mg/dL — ABNORMAL LOW (ref 0.76–1.27)
GFR calc Af Amer: 160 mL/min/{1.73_m2} (ref >59)
GFR calc non Af Amer: 138 mL/min/{1.73_m2} (ref >59)
Globulin, Total: 2.6 g/dL (ref 1.5–4.5)
Glucose: 305 mg/dL — ABNORMAL HIGH (ref 65–99)
Potassium: 4.1 mmol/L (ref 3.5–5.2)
Sodium: 128 mmol/L — ABNORMAL LOW (ref 134–144)
Total Protein: 6.8 g/dL (ref 6.0–8.5)

## 2019-10-01 LAB — LIPID PANEL
Chol/HDL Ratio: 59.7 ratio — ABNORMAL HIGH (ref 0.0–5.0)
Cholesterol, Total: 597 mg/dL — ABNORMAL HIGH (ref 100–199)
HDL: 10 mg/dL — ABNORMAL LOW (ref >39)
Triglycerides: 5259 mg/dL (ref 0–149)

## 2019-10-01 NOTE — Addendum Note (Signed)
Addended by: Rolena Infante on: 10/01/2019 03:37 PM   Modules accepted: Orders

## 2019-10-02 LAB — HGB A1C W/O EAG: Hgb A1c MFr Bld: 8.2 % — ABNORMAL HIGH (ref 4.8–5.6)

## 2019-10-02 MED ORDER — METFORMIN HCL 1000 MG PO TABS: 1000.0000 mg | ORAL_TABLET | Freq: Two times a day (BID) | ORAL | 3 refills | Status: DC

## 2019-10-02 NOTE — Addendum Note (Signed)
Addended by: Chevis Pretty on: 10/02/2019 12:12 PM   Modules accepted: Orders

## 2019-10-26 DIAGNOSIS — R000 Tachycardia, unspecified: Secondary | ICD-10-CM | POA: Diagnosis not present

## 2019-10-26 DIAGNOSIS — R112 Nausea with vomiting, unspecified: Secondary | ICD-10-CM | POA: Diagnosis not present

## 2019-10-26 DIAGNOSIS — R0902 Hypoxemia: Secondary | ICD-10-CM | POA: Diagnosis not present

## 2019-10-26 DIAGNOSIS — R0689 Other abnormalities of breathing: Secondary | ICD-10-CM | POA: Diagnosis not present

## 2019-10-26 DIAGNOSIS — E1010 Type 1 diabetes mellitus with ketoacidosis without coma: Secondary | ICD-10-CM | POA: Diagnosis not present

## 2019-10-26 DIAGNOSIS — R064 Hyperventilation: Secondary | ICD-10-CM | POA: Diagnosis not present

## 2019-10-26 DIAGNOSIS — E1110 Type 2 diabetes mellitus with ketoacidosis without coma: Secondary | ICD-10-CM | POA: Diagnosis not present

## 2019-10-26 DIAGNOSIS — K760 Fatty (change of) liver, not elsewhere classified: Secondary | ICD-10-CM | POA: Diagnosis not present

## 2019-10-26 DIAGNOSIS — R1013 Epigastric pain: Secondary | ICD-10-CM | POA: Diagnosis not present

## 2019-10-27 DIAGNOSIS — E1110 Type 2 diabetes mellitus with ketoacidosis without coma: Secondary | ICD-10-CM | POA: Diagnosis not present

## 2019-10-27 DIAGNOSIS — I10 Essential (primary) hypertension: Secondary | ICD-10-CM | POA: Diagnosis not present

## 2019-10-27 DIAGNOSIS — F909 Attention-deficit hyperactivity disorder, unspecified type: Secondary | ICD-10-CM | POA: Diagnosis not present

## 2019-10-29 DIAGNOSIS — E876 Hypokalemia: Secondary | ICD-10-CM | POA: Diagnosis not present

## 2019-10-29 DIAGNOSIS — F79 Unspecified intellectual disabilities: Secondary | ICD-10-CM | POA: Diagnosis not present

## 2019-10-29 DIAGNOSIS — E1110 Type 2 diabetes mellitus with ketoacidosis without coma: Secondary | ICD-10-CM | POA: Diagnosis not present

## 2019-10-30 ENCOUNTER — Other Ambulatory Visit: Payer: Self-pay

## 2019-10-30 ENCOUNTER — Other Ambulatory Visit: Payer: Self-pay | Admitting: Family

## 2019-10-30 ENCOUNTER — Encounter: Payer: Self-pay | Admitting: Family

## 2019-10-30 ENCOUNTER — Ambulatory Visit: Payer: Medicaid Other | Admitting: Family

## 2019-10-30 VITALS — BP 144/82 | HR 102 | Temp 98.4°F | Ht 66.0 in | Wt 226.0 lb

## 2019-10-30 DIAGNOSIS — Z09 Encounter for follow-up examination after completed treatment for conditions other than malignant neoplasm: Secondary | ICD-10-CM

## 2019-10-30 DIAGNOSIS — E108 Type 1 diabetes mellitus with unspecified complications: Secondary | ICD-10-CM

## 2019-10-30 DIAGNOSIS — E1010 Type 1 diabetes mellitus with ketoacidosis without coma: Secondary | ICD-10-CM

## 2019-10-30 DIAGNOSIS — E876 Hypokalemia: Secondary | ICD-10-CM | POA: Diagnosis not present

## 2019-10-30 LAB — URINALYSIS
Bilirubin, UA: NEGATIVE
Leukocytes,UA: NEGATIVE
Nitrite, UA: NEGATIVE
Specific Gravity, UA: >1.03 — ABNORMAL HIGH (ref 1.005–1.030)
Urobilinogen, Ur: 1 mg/dL (ref 0.2–1.0)
pH, UA: 7 (ref 5.0–7.5)

## 2019-10-30 LAB — BAYER DCA HB A1C WAIVED: HB A1C (BAYER DCA - WAIVED): 10.6 % — ABNORMAL HIGH (ref <7.0)

## 2019-10-30 LAB — GLUCOSE HEMOCUE WAIVED: Glu Hemocue Waived: 157 mg/dL — ABNORMAL HIGH (ref 65–99)

## 2019-10-30 MED ORDER — PEN NEEDLES 31G X 5 MM MISC: 1.0000 | Freq: Every evening | 6 refills | Status: DC

## 2019-10-30 MED ORDER — ACCU-CHEK GUIDE W/DEVICE KIT: 1.0000 | PACK | Freq: Four times a day (QID) | 0 refills | Status: DC | PRN

## 2019-10-30 MED ORDER — BLOOD GLUCOSE METER KIT: PACK | 0 refills | Status: DC

## 2019-10-30 NOTE — Progress Notes (Signed)
Subjective:    Patient ID: Stephen Richardson, male    DOB: 08/12/1996, 23 y.o.   MRN: [id number]  Chief Complaint  Patient presents with  . Diabetes    new onset   Pt presents to the office today for diabetic follow up. He was discharged from the hospital yesterday for diabetic ketoacidosis. He was started on an insulin drip and given IV fluids and IV potassium.  PT states he checks his blood glucose at home, but states the battery goes out.   Pt has some developmental delays, but mother is present and is helping with glucose monitoring and lantus. He was discharged home on 20 units of Lantus. He does not have any pen needles.  Diabetes He presents for his follow-up diabetic visit. He has type 1 diabetes mellitus. His disease course has been worsening. Hypoglycemia symptoms include hunger. Pertinent negatives for hypoglycemia include no dizziness or headaches. Associated symptoms include polydipsia, polyphagia, polyuria and visual change. Pertinent negatives for diabetes include no blurred vision, no foot paresthesias and no weakness. Hypoglycemia complications include hospitalization and required assistance. Symptoms are worsening. Pertinent negatives for diabetic complications include no CVA or heart disease. Risk factors for coronary artery disease include diabetes mellitus.      Review of Systems  Eyes: Negative for blurred vision.  Endocrine: Positive for polydipsia, polyphagia and polyuria.  Neurological: Negative for dizziness, weakness and headaches.   Family History  Problem Relation Age of Onset  . Kidney disease Father   . Diabetes Father   . Pancreatitis Father    Social History   Socioeconomic History  . Marital status: Single    Spouse name: Not on file  . Number of children: Not on file  . Years of education: Not on file  . Highest education level: Not on file  Occupational History  . Not on file  Social Needs  . Financial resource strain: Not on file  . Food  insecurity    Worry: Not on file    Inability: Not on file  . Transportation needs    Medical: Not on file    Non-medical: Not on file  Tobacco Use  . Smoking status: Never Smoker  . Smokeless tobacco: Never Used  Substance and Sexual Activity  . Alcohol use: No  . Drug use: No  . Sexual activity: Never  Lifestyle  . Physical activity    Days per week: Not on file    Minutes per session: Not on file  . Stress: Not on file  Relationships  . Social Herbalist on phone: Not on file    Gets together: Not on file    Attends religious service: Not on file    Active member of club or organization: Not on file    Attends meetings of clubs or organizations: Not on file    Relationship status: Not on file  Other Topics Concern  . Not on file  Social History Narrative  . Not on file        Objective:   Physical Exam Vitals signs reviewed.  Constitutional:      General: He is not in acute distress.    Appearance: He is well-developed.  HENT:     Head: Normocephalic.     Right Ear: Tympanic membrane normal.     Left Ear: Tympanic membrane normal.  Eyes:     General:        Right eye: No discharge.  Left eye: No discharge.     Pupils: Pupils are equal, round, and reactive to light.  Neck:     Musculoskeletal: Normal range of motion and neck supple.     Thyroid: No thyromegaly.  Cardiovascular:     Rate and Rhythm: Normal rate and regular rhythm.     Heart sounds: Normal heart sounds. No murmur.  Pulmonary:     Effort: Pulmonary effort is normal. No respiratory distress.     Breath sounds: Normal breath sounds. No wheezing.  Abdominal:     General: Bowel sounds are normal. There is no distension.     Palpations: Abdomen is soft.     Tenderness: There is no abdominal tenderness.  Musculoskeletal: Normal range of motion.        General: No tenderness.  Skin:    General: Skin is warm and dry.     Findings: No erythema or rash.  Neurological:      Mental Status: He is alert and oriented to person, place, and time.     Cranial Nerves: No cranial nerve deficit.     Deep Tendon Reflexes: Reflexes are normal and symmetric.  Psychiatric:        Behavior: Behavior normal.        Thought Content: Thought content normal.        Judgment: Judgment normal.    Pt demonstrated giving himself 20 units of Lantus in right thigh. Did well.   BP (!) 144/82   Pulse (!) 102   Temp 98.4 F (36.9 C) (Temporal)   Ht 5' 6"  (1.676 m)   Wt 226 lb (102.5 kg)   SpO2 97%   BMI 36.48 kg/m      Assessment & Plan:  Stephen Richardson comes in today with chief complaint of Diabetes (new onset)   Diagnosis and orders addressed:  1. Hospital discharge follow-up - blood glucose meter kit and supplies; Dispense based on patient and insurance preference. Use up to four times daily as directed. (FOR ICD-10 E10.9, E11.9).  Dispense: 1 each; Refill: 0 - Ambulatory referral to Endocrinology  2. Diabetic ketoacidosis without coma associated with type 1 diabetes mellitus ([place]) - blood glucose meter kit and supplies; Dispense based on patient and insurance preference. Use up to four times daily as directed. (FOR ICD-10 E10.9, E11.9).  Dispense: 1 each; Refill: 0 - FINGERSTICK BLOOD SUGAR - CMP14+EGFR - CBC with Differential/Platelet - Urinalysis- dip only - Microalbumin / creatinine urine ratio - Ambulatory referral to Endocrinology  3. Hypokalemia - Ambulatory referral to Endocrinology   Labs pending Greater than 45 mins spent with patient educating him on diabetic diet, insulin, s/s of ketoacidosis, glucose monitoring.  I have placed a referral to Endo and Diabetic Educator. Discussed the importance of following up with these and keeping f/u with PCP Health Maintenance reviewed Diet and exercise encouraged Hospital notes reviewed and will be scanned into the chart  Evelina Dun, FNP

## 2019-10-30 NOTE — Patient Instructions (Signed)
Diabetes Mellitus and Nutrition, Adult When you have diabetes (diabetes mellitus), it is very important to have healthy eating habits because your blood sugar (glucose) levels are greatly affected by what you eat and drink. Eating healthy foods in the appropriate amounts, at about the same times every day, can help you:  Control your blood glucose.  Lower your risk of heart disease.  Improve your blood pressure.  Reach or maintain a healthy weight. Every person with diabetes is different, and each person has different needs for a meal plan. Your health care provider may recommend that you work with a diet and nutrition specialist (dietitian) to make a meal plan that is best for you. Your meal plan may vary depending on factors such as:  The calories you need.  The medicines you take.  Your weight.  Your blood glucose, blood pressure, and cholesterol levels.  Your activity level.  Other health conditions you have, such as heart or kidney disease. How do carbohydrates affect me? Carbohydrates, also called carbs, affect your blood glucose level more than any other type of food. Eating carbs naturally raises the amount of glucose in your blood. Carb counting is a method for keeping track of how many carbs you eat. Counting carbs is important to keep your blood glucose at a healthy level, especially if you use insulin or take certain oral diabetes medicines. It is important to know how many carbs you can safely have in each meal. This is different for every person. Your dietitian can help you calculate how many carbs you should have at each meal and for each snack. Foods that contain carbs include:  Bread, cereal, rice, pasta, and crackers.  Potatoes and corn.  Peas, beans, and lentils.  Milk and yogurt.  Fruit and juice.  Desserts, such as cakes, cookies, ice cream, and candy. How does alcohol affect me? Alcohol can cause a sudden decrease in blood glucose (hypoglycemia),  especially if you use insulin or take certain oral diabetes medicines. Hypoglycemia can be a life-threatening condition. Symptoms of hypoglycemia (sleepiness, dizziness, and confusion) are similar to symptoms of having too much alcohol. If your health care provider says that alcohol is safe for you, follow these guidelines:  Limit alcohol intake to no more than 1 drink per day for nonpregnant women and 2 drinks per day for men. One drink equals 12 oz of beer, 5 oz of wine, or 1 oz of hard liquor.  Do not drink on an empty stomach.  Keep yourself hydrated with water, diet soda, or unsweetened iced tea.  Keep in mind that regular soda, juice, and other mixers may contain a lot of sugar and must be counted as carbs. What are tips for following this plan?  Reading food labels  Start by checking the serving size on the "Nutrition Facts" label of packaged foods and drinks. The amount of calories, carbs, fats, and other nutrients listed on the label is based on one serving of the item. Many items contain more than one serving per package.  Check the total grams (g) of carbs in one serving. You can calculate the number of servings of carbs in one serving by dividing the total carbs by 15. For example, if a food has 30 g of total carbs, it would be equal to 2 servings of carbs.  Check the number of grams (g) of saturated and trans fats in one serving. Choose foods that have low or no amount of these fats.  Check the number of   milligrams (mg) of salt (sodium) in one serving. Most people should limit total sodium intake to less than 2,300 mg per day.  Always check the nutrition information of foods labeled as "low-fat" or "nonfat". These foods may be higher in added sugar or refined carbs and should be avoided.  Talk to your dietitian to identify your daily goals for nutrients listed on the label. Shopping  Avoid buying canned, premade, or processed foods. These foods tend to be high in fat, sodium,  and added sugar.  Shop around the outside edge of the grocery store. This includes fresh fruits and vegetables, bulk grains, fresh meats, and fresh dairy. Cooking  Use low-heat cooking methods, such as baking, instead of high-heat cooking methods like deep frying.  Cook using healthy oils, such as olive, canola, or sunflower oil.  Avoid cooking with butter, cream, or high-fat meats. Meal planning  Eat meals and snacks regularly, preferably at the same times every day. Avoid going long periods of time without eating.  Eat foods high in fiber, such as fresh fruits, vegetables, beans, and whole grains. Talk to your dietitian about how many servings of carbs you can eat at each meal.  Eat 4-6 ounces (oz) of lean protein each day, such as lean meat, chicken, fish, eggs, or tofu. One oz of lean protein is equal to: ? 1 oz of meat, chicken, or fish. ? 1 egg. ?  cup of tofu.  Eat some foods each day that contain healthy fats, such as avocado, nuts, seeds, and fish. Lifestyle  Check your blood glucose regularly.  Exercise regularly as told by your health care provider. This may include: ? 150 minutes of moderate-intensity or vigorous-intensity exercise each week. This could be brisk walking, biking, or water aerobics. ? Stretching and doing strength exercises, such as yoga or weightlifting, at least 2 times a week.  Take medicines as told by your health care provider.  Do not use any products that contain nicotine or tobacco, such as cigarettes and e-cigarettes. If you need help quitting, ask your health care provider.  Work with a Veterinary surgeon or diabetes educator to identify strategies to manage stress and any emotional and social challenges. Questions to ask a health care provider  Do I need to meet with a diabetes educator?  Do I need to meet with a dietitian?  What number can I call if I have questions?  When are the best times to check my blood glucose? Where to find more  information:  American Diabetes Association: diabetes.org  Academy of Nutrition and Dietetics: www.eatright.[employer] of Diabetes and Digestive and Kidney Diseases (NIH): CarFlippers.tn Summary  A healthy meal plan will help you control your blood glucose and maintain a healthy lifestyle.  Working with a diet and nutrition specialist (dietitian) can help you make a meal plan that is best for you.  Keep in mind that carbohydrates (carbs) and alcohol have immediate effects on your blood glucose levels. It is important to count carbs and to use alcohol carefully. This information is not intended to replace advice given to you by your health care provider. Make sure you discuss any questions you have with your health care provider. Document Released: 08/25/2005 Document Revised: 11/10/2017 Document Reviewed: 01/02/2017 Elsevier Patient Education  2020 [employer]. Diabetic Ketoacidosis Diabetic ketoacidosis is a serious complication of diabetes. This condition develops when there is not enough insulin in the body. Insulin is an hormone that regulates blood sugar levels in the body. Normally, insulin  allows glucose to enter the cells in the body. The cells break down glucose for energy. Without enough insulin, the body cannot break down glucose, so it breaks down fats instead. This leads to high blood glucose levels in the body and the production of acids that are called ketones. Ketones are poisonous at high levels. If diabetic ketoacidosis is not treated, it can cause severe dehydration and can lead to a coma or death. What are the causes? This condition develops when a lack of insulin causes the body to break down fats instead of glucose. This may be triggered by:  Stress on the body. This stress is brought on by an illness.  Infection.  Medicines that raise blood glucose levels.  Not taking diabetes medicine.  New onset of type 1 diabetes mellitus. What are the signs  or symptoms? Symptoms of this condition include:  Fatigue.  Weight loss.  Excessive thirst.  Light-headedness.  Fruity or sweet-smelling breath.  Excessive urination.  Vision changes.  Confusion or irritability.  Nausea.  Vomiting.  Rapid breathing.  Abdominal pain.  Feeling flushed. How is this diagnosed? This condition is diagnosed based on your medical history, a physical exam, and blood tests. You may also have a urine test to check for ketones. How is this treated? This condition may be treated with:  Fluid replacement. This may be done to correct dehydration.  Insulin injections. These may be given through the skin or through an IV tube.  Electrolyte replacement. Electrolytes are minerals in your blood. Electrolytes such as potassium and sodium may be given in pill form or through an IV tube.  Antibiotic medicines. These may be prescribed if your condition was caused by an infection. Diabetic ketoacidosis is a serious medical condition. You may need emergency treatment in the hospital to monitor your condition. Follow these instructions at home: Eating and drinking  Drink enough fluids to keep your urine clear or pale yellow.  If you are not able to eat, drink clear fluids in small amounts as you are able. Clear fluids include water, ice chips, fruit juice with water added (diluted), and low-calorie sports drinks. You may also have sugar-free jello or popsicles.  If you are able to eat, follow your usual diet and drink sugar-free liquids, such as water. Medicines  Take over-the-counter and prescription medicines only as told by your health care provider.  Continue to take insulin and other diabetes medicines as told by your health care provider.  If you were prescribed an antibiotic, take it as told by your health care provider. Do not stop taking the antibiotic even if you start to feel better. General instructions   Check your urine for ketones when  you are ill and as told by your health care provider. ? If your blood glucose is 240 mg/dL (40.913.3 mmol/L) or higher, check your urine ketones every 4-6 hours.  Check your blood glucose every day, as often as told by your health care provider. ? If your blood glucose is high, drink plenty of fluids. This helps to flush out ketones. ? If your blood glucose is above your target for 2 tests in a row, contact your health care provider.  Carry a medical alert card or wear medical alert jewelry that says that you have diabetes.  Rest and exercise only as told by your health care provider. Do not exercise when your blood glucose is high and you have ketones in your urine.  If you get sick, call your health care  provider and begin treatment quickly. Your body often needs extra insulin to fight an illness. Check your blood glucose every 4-6 hours when you are sick.  Keep all follow-up visits as told by your health care provider. This is important. Contact a health care provider if:  Your blood glucose level is higher than 240 mg/dL (13.3 mmol/L) for 2 days in a row.  You have moderate or large ketones in your urine.  You have a fever.  You cannot eat or drink without vomiting.  You have been vomiting for more than 2 hours.  You continue to have symptoms of diabetic ketoacidosis.  You develop new symptoms. Get help right away if:  Your blood glucose monitor reads "high" even when you are taking insulin.  You faint.  You have chest pain.  You have trouble breathing.  You have sudden trouble speaking or swallowing.  You have vomiting or diarrhea that gets worse after 3 hours.  You are unable to stay awake.  You have trouble thinking.  You are severely dehydrated. Symptoms of severe dehydration include: ? Extreme thirst. ? Dry mouth. ? Rapid breathing. These symptoms may represent a serious problem that is an emergency. Do not wait to see if the symptoms will go away. Get medical  help right away. Call your local emergency services (911 in the [place] Do not drive yourself to the hospital. Summary  Diabetic ketoacidosis is a serious complication of diabetes. This condition develops when there is not enough insulin in the body.  This condition is diagnosed based on your medical history, a physical exam, and blood tests. You may also have a urine test to check for ketones.  Diabetic ketoacidosis is a serious medical condition. You may need emergency treatment in the hospital to monitor your condition.  Contact your health care provider if your blood glucose is higher than 240 mg/dl for 2 days in a row or if you have moderate or large ketones in your urine. This information is not intended to replace advice given to you by your health care provider. Make sure you discuss any questions you have with your health care provider. Document Released: 11/25/2000 Document Revised: 01/13/2017 Document Reviewed: 01/02/2017 Elsevier Patient Education  2020 [employer].

## 2019-10-31 LAB — CBC WITH DIFFERENTIAL/PLATELET
Basophils Absolute: 0.1 10*3/uL (ref 0.0–0.2)
Basos: 1 %
EOS (ABSOLUTE): 0.2 10*3/uL (ref 0.0–0.4)
Eos: 3 %
Hematocrit: 41.2 % (ref 37.5–51.0)
Hemoglobin: 14.2 g/dL (ref 13.0–17.7)
Immature Grans (Abs): 0 10*3/uL (ref 0.0–0.1)
Immature Granulocytes: 0 %
Lymphocytes Absolute: 2.1 10*3/uL (ref 0.7–3.1)
Lymphs: 33 %
MCH: 29.2 pg (ref 26.6–33.0)
MCHC: 34.5 g/dL (ref 31.5–35.7)
MCV: 85 fL (ref 79–97)
Monocytes Absolute: 0.5 10*3/uL (ref 0.1–0.9)
Monocytes: 8 %
Neutrophils Absolute: 3.5 10*3/uL (ref 1.4–7.0)
Neutrophils: 55 %
Platelets: 311 10*3/uL (ref 150–450)
RBC: 4.87 x10E6/uL (ref 4.14–5.80)
RDW: 14.1 % (ref 11.6–15.4)
WBC: 6.3 10*3/uL (ref 3.4–10.8)

## 2019-10-31 LAB — MICROALBUMIN / CREATININE URINE RATIO
Creatinine, Urine: 179.9 mg/dL
Microalb/Creat Ratio: 3 mg/g creat (ref 0–29)
Microalbumin, Urine: 4.9 ug/mL

## 2019-10-31 LAB — CMP14+EGFR
ALT: 15 IU/L (ref 0–44)
AST: 43 IU/L — ABNORMAL HIGH (ref 0–40)
Albumin/Globulin Ratio: 1.9 (ref 1.2–2.2)
Albumin: 4 g/dL — ABNORMAL LOW (ref 4.1–5.2)
Alkaline Phosphatase: 109 IU/L (ref 39–117)
BUN/Creatinine Ratio: 16 (ref 9–20)
BUN: 7 mg/dL (ref 6–20)
Bilirubin Total: 0.4 mg/dL (ref 0.0–1.2)
CO2: 22 mmol/L (ref 20–29)
Calcium: 9.3 mg/dL (ref 8.7–10.2)
Chloride: 100 mmol/L (ref 96–106)
Creatinine, Ser: 0.44 mg/dL — ABNORMAL LOW (ref 0.76–1.27)
GFR calc Af Amer: 187 mL/min/{1.73_m2} (ref >59)
GFR calc non Af Amer: 162 mL/min/{1.73_m2} (ref >59)
Globulin, Total: 2.1 g/dL (ref 1.5–4.5)
Glucose: 185 mg/dL — ABNORMAL HIGH (ref 65–99)
Potassium: 3.2 mmol/L — ABNORMAL LOW (ref 3.5–5.2)
Sodium: 139 mmol/L (ref 134–144)
Total Protein: 6.1 g/dL (ref 6.0–8.5)

## 2019-11-05 ENCOUNTER — Other Ambulatory Visit: Payer: Self-pay | Admitting: Family

## 2019-11-05 MED ORDER — POTASSIUM CHLORIDE ER 10 MEQ PO TBCR: 10.0000 meq | EXTENDED_RELEASE_TABLET | Freq: Two times a day (BID) | ORAL | 0 refills | Status: DC

## 2019-11-14 ENCOUNTER — Other Ambulatory Visit: Payer: Medicaid Other

## 2019-11-14 ENCOUNTER — Other Ambulatory Visit: Payer: Self-pay

## 2019-11-14 DIAGNOSIS — E119 Type 2 diabetes mellitus without complications: Secondary | ICD-10-CM

## 2019-11-14 LAB — HGB A1C W/O EAG: Hgb A1c MFr Bld: 8.9 % — ABNORMAL HIGH (ref 4.8–5.6)

## 2019-11-17 ENCOUNTER — Other Ambulatory Visit: Payer: Self-pay | Admitting: Family

## 2019-12-01 ENCOUNTER — Other Ambulatory Visit: Payer: Self-pay | Admitting: Family

## 2019-12-01 ENCOUNTER — Other Ambulatory Visit: Payer: Self-pay | Admitting: Nurse Practitioner

## 2019-12-17 ENCOUNTER — Other Ambulatory Visit: Payer: Self-pay | Admitting: *Deleted

## 2019-12-17 ENCOUNTER — Encounter: Payer: Self-pay | Admitting: Nutrition

## 2019-12-17 ENCOUNTER — Encounter: Payer: Medicaid Other | Attending: Family | Admitting: Nutrition

## 2019-12-17 ENCOUNTER — Other Ambulatory Visit: Payer: Self-pay

## 2019-12-17 VITALS — Wt 230.0 lb

## 2019-12-17 DIAGNOSIS — E1165 Type 2 diabetes mellitus with hyperglycemia: Secondary | ICD-10-CM | POA: Insufficient documentation

## 2019-12-17 DIAGNOSIS — E1169 Type 2 diabetes mellitus with other specified complication: Secondary | ICD-10-CM | POA: Diagnosis not present

## 2019-12-17 DIAGNOSIS — Z6837 Body mass index (BMI) 37.0-37.9, adult: Secondary | ICD-10-CM | POA: Diagnosis not present

## 2019-12-17 DIAGNOSIS — E118 Type 2 diabetes mellitus with unspecified complications: Secondary | ICD-10-CM | POA: Insufficient documentation

## 2019-12-17 DIAGNOSIS — I10 Essential (primary) hypertension: Secondary | ICD-10-CM | POA: Insufficient documentation

## 2019-12-17 DIAGNOSIS — E785 Hyperlipidemia, unspecified: Secondary | ICD-10-CM | POA: Insufficient documentation

## 2019-12-17 DIAGNOSIS — IMO0002 Reserved for concepts with insufficient information to code with codable children: Secondary | ICD-10-CM

## 2019-12-17 MED ORDER — LANTUS SOLOSTAR 100 UNIT/ML ~~LOC~~ SOPN: 20.0000 [IU] | PEN_INJECTOR | Freq: Every day | SUBCUTANEOUS | 6 refills | Status: DC

## 2019-12-17 MED ORDER — INSULIN GLARGINE 100 UNIT/ML ~~LOC~~ SOLN: 20.0000 [IU] | Freq: Every day | SUBCUTANEOUS | 6 refills | Status: DC

## 2019-12-17 NOTE — Progress Notes (Signed)
Medical Nutrition Therapy:  Appt start time: 1300 end time:  1400.   Assessment:  Primary concerns today: DM Type 2, Obesity. Her with his mom. Just diagnosed a few weeks ago and was in the hospital. Eats 3 meals and snacks between. He and his mom have been working on cutting out junk food and sweets and soda since leaving hospital in November 2020.Marland Kitchen Complains of frequent urniation, blurry vision, fatigue and lack of motivation recently.  He is testing bloods sugars usually 3-4 times a day; before meals and bedtime. Scheduled to see Pediatric Endocrinology in early February 2021. He and his mom are motivated to make changes with diet and exercise. BS at home before meals have been FBS 134, 128, 116, 124, 117 Before lunch: 250, 119, 259, 205,  Before dinenr, 201, 197, 122 mg Bedtime , 189, 122, 143, Currently on 20 units of Lantus but has run out and has been trying to get back in touch with PCP for new prescription. They are waiting on a call back regarding prescription.   Lab Results  Component Value Date   HGBA1C 8.9 (H) 11/14/2019   CMP Latest Ref Rng & Units 10/30/2019 09/30/2019 02/25/2019  Glucose 65 - 99 mg/dL 185(H) 305(H) 153(H)  BUN 6 - 20 mg/dL _0 Creatinine 0.76 - 1.27 mg/dL 0.44(L) 0.66(L) 0.58(L)  Sodium 134 - 144 mmol/L 139 128(L) 137  Potassium 3.5 - 5.2 mmol/L 3.2(L) 4.1 4.7  Chloride 96 - 106 mmol/L 100 90(L) 99  CO2 20 - 29 mmol/L 22 20 17(L)  Calcium 8.7 - 10.2 mg/dL 9.3 8.6(L) 10.2  Total Protein 6.0 - 8.5 g/dL 6.1 6.8 7.0  Total Bilirubin 0.0 - 1.2 mg/dL 0.4 0.3 0.3  Alkaline Phos 39 - 117 IU/L 109 125(H) 95  AST 0 - 40 IU/L 43(H) 21 23  ALT 0 - 44 IU/L _1 Lipid Panel     Component Value Date/Time   CHOL 597 (H) 09/30/2019 1109   TRIG 5,259 (HH) 09/30/2019 1109   HDL 10 (L) 09/30/2019 1109   CHOLHDL 59.7 (H) 09/30/2019 1109   LDLCALC Comment (A) 09/30/2019 1109   LDLDIRECT 18 09/30/2019 1109   LABVLDL Comment (A) 09/30/2019 1109       Preferred Learning Style:   No preference indicated   Learning Readiness:   Ready  Change in progress   MEDICATIONS:   DIETARY INTAKE: Eats 3 meals and snacks usually..  Usual physical activity: walks some  Estimated energy needs: 1800 calories 200 g carbohydrates 135 g protein 50 g fat  Progress Towards Goal(s):  In progress.   Nutritional Diagnosis:  NB-1.1 Food and nutrition-related knowledge deficit As related to Diabetes Type 2.  As evidenced by A1C 8.9%.    Intervention:  Nutrition and Diabetes education provided on My Plate, CHO counting, meal planning, portion sizes, timing of meals, avoiding snacks between meals unless having a low blood sugar, target ranges for A1C and blood sugars, signs/symptoms and treatment of hyper/hypoglycemia, monitoring blood sugars, taking medications as prescribed, benefits of exercising 30 minutes per day and prevention of complications of DM.  Goals Follow My Plate Eat three balanced meals at times discussed. Drink only water. Cut out snacks, mac/cheese, ramens and processed foods. Increase fresh fruits and vegetables. Test BS 4 times per day and record on sheet Keep food journal and bring next visit with BS logs. Lose 1 lb per week. .  Teaching Method Utilized:  Visual Auditory Hands on  Handouts given during visit include:  The Plate Method   Meal Plan Card  Diabetes Instructions.   Barriers to learning/adherence to lifestyle change: none  Demonstrated degree of understanding via:  Teach Back   Monitoring/Evaluation:  Dietary intake, exercise,  and body weight in 1 month(s).

## 2019-12-17 NOTE — Patient Instructions (Signed)
Goals Follow MyPlate Get in 5,000 steps a day Eat meals on time Cut out junk food Drink a gallon of water per day Cut out snacks Give insulin in stomach Increase fresh fruits and vegetables. Get A1Cto 7% Lose 1-2 lbs per week.

## 2019-12-17 NOTE — Addendum Note (Signed)
Addended by: Magdalene River on: 12/17/2019 02:33 PM   Modules accepted: Orders

## 2019-12-18 ENCOUNTER — Encounter: Payer: Self-pay | Admitting: Nutrition

## 2019-12-30 ENCOUNTER — Other Ambulatory Visit: Payer: Self-pay

## 2019-12-31 ENCOUNTER — Encounter: Payer: Self-pay | Admitting: Nurse Practitioner

## 2019-12-31 ENCOUNTER — Ambulatory Visit (INDEPENDENT_AMBULATORY_CARE_PROVIDER_SITE_OTHER): Payer: Medicaid Other | Admitting: Nurse Practitioner

## 2019-12-31 VITALS — BP 139/83 | HR 82 | Temp 97.5°F | Resp 20 | Ht 66.0 in | Wt 231.0 lb

## 2019-12-31 DIAGNOSIS — F900 Attention-deficit hyperactivity disorder, predominantly inattentive type: Secondary | ICD-10-CM

## 2019-12-31 DIAGNOSIS — Z6837 Body mass index (BMI) 37.0-37.9, adult: Secondary | ICD-10-CM | POA: Diagnosis not present

## 2019-12-31 DIAGNOSIS — E785 Hyperlipidemia, unspecified: Secondary | ICD-10-CM | POA: Diagnosis not present

## 2019-12-31 DIAGNOSIS — R6250 Unspecified lack of expected normal physiological development in childhood: Secondary | ICD-10-CM

## 2019-12-31 DIAGNOSIS — E108 Type 1 diabetes mellitus with unspecified complications: Secondary | ICD-10-CM

## 2019-12-31 DIAGNOSIS — I10 Essential (primary) hypertension: Secondary | ICD-10-CM | POA: Diagnosis not present

## 2019-12-31 DIAGNOSIS — Q231 Congenital insufficiency of aortic valve: Secondary | ICD-10-CM

## 2019-12-31 DIAGNOSIS — E1169 Type 2 diabetes mellitus with other specified complication: Secondary | ICD-10-CM

## 2019-12-31 LAB — BAYER DCA HB A1C WAIVED: HB A1C (BAYER DCA - WAIVED): 6.8 % (ref <7.0)

## 2019-12-31 MED ORDER — LISDEXAMFETAMINE DIMESYLATE 50 MG PO CAPS: 50.0000 mg | ORAL_CAPSULE | Freq: Every day | ORAL | 0 refills | Status: DC

## 2019-12-31 MED ORDER — LISINOPRIL 5 MG PO TABS: 5.0000 mg | ORAL_TABLET | Freq: Every day | ORAL | 5 refills | Status: DC

## 2019-12-31 MED ORDER — FENOFIBRATE 145 MG PO TABS: 145.0000 mg | ORAL_TABLET | Freq: Every day | ORAL | 5 refills | Status: DC

## 2019-12-31 MED ORDER — POTASSIUM CHLORIDE ER 10 MEQ PO TBCR: 10.0000 meq | EXTENDED_RELEASE_TABLET | Freq: Two times a day (BID) | ORAL | 0 refills | Status: DC

## 2019-12-31 MED ORDER — METFORMIN HCL 500 MG PO TABS: 500.0000 mg | ORAL_TABLET | Freq: Two times a day (BID) | ORAL | 3 refills | Status: DC

## 2019-12-31 NOTE — Patient Instructions (Signed)
Carbohydrate Counting for Diabetes Mellitus, Adult  Carbohydrate counting is a method of keeping track of how many carbohydrates you eat. Eating carbohydrates naturally increases the amount of sugar (glucose) in the blood. Counting how many carbohydrates you eat helps keep your blood glucose within normal limits, which helps you manage your diabetes (diabetes mellitus). It is important to know how many carbohydrates you can safely have in each meal. This is different for every person. A diet and nutrition specialist (registered dietitian) can help you make a meal plan and calculate how many carbohydrates you should have at each meal and snack. Carbohydrates are found in the following foods:  Grains, such as breads and cereals.  Dried beans and soy products.  Starchy vegetables, such as potatoes, peas, and corn.  Fruit and fruit juices.  Milk and yogurt.  Sweets and snack foods, such as cake, cookies, candy, chips, and soft drinks. How do I count carbohydrates? There are two ways to count carbohydrates in food. You can use either of the methods or a combination of both. Reading "Nutrition Facts" on packaged food The "Nutrition Facts" list is included on the labels of almost all packaged foods and beverages in the [place] It includes:  The serving size.  Information about nutrients in each serving, including the grams (g) of carbohydrate per serving. To use the "Nutrition Facts":  Decide how many servings you will have.  Multiply the number of servings by the number of carbohydrates per serving.  The resulting number is the total amount of carbohydrates that you will be having. Learning standard serving sizes of other foods When you eat carbohydrate foods that are not packaged or do not include "Nutrition Facts" on the label, you need to measure the servings in order to count the amount of carbohydrates:  Measure the foods that you will eat with a food scale or measuring cup, if  needed.  Decide how many standard-size servings you will eat.  Multiply the number of servings by 15. Most carbohydrate-rich foods have about 15 g of carbohydrates per serving. ? For example, if you eat 8 oz (170 g) of strawberries, you will have eaten 2 servings and 30 g of carbohydrates (2 servings x 15 g = 30 g).  For foods that have more than one food mixed, such as soups and casseroles, you must count the carbohydrates in each food that is included. The following list contains standard serving sizes of common carbohydrate-rich foods. Each of these servings has about 15 g of carbohydrates:   hamburger bun or  English muffin.   oz (15 mL) syrup.   oz (14 g) jelly.  1 slice of bread.  1 six-inch tortilla.  3 oz (85 g) cooked rice or pasta.  4 oz (113 g) cooked dried beans.  4 oz (113 g) starchy vegetable, such as peas, corn, or potatoes.  4 oz (113 g) hot cereal.  4 oz (113 g) mashed potatoes or  of a large baked potato.  4 oz (113 g) canned or frozen fruit.  4 oz (120 mL) fruit juice.  4-6 crackers.  6 chicken nuggets.  6 oz (170 g) unsweetened dry cereal.  6 oz (170 g) plain fat-free yogurt or yogurt sweetened with artificial sweeteners.  8 oz (240 mL) milk.  8 oz (170 g) fresh fruit or one small piece of fruit.  24 oz (680 g) popped popcorn. Example of carbohydrate counting Sample meal  3 oz (85 g) chicken breast.  6 oz (170 g)   brown rice.  4 oz (113 g) corn.  8 oz (240 mL) milk.  8 oz (170 g) strawberries with sugar-free whipped topping. Carbohydrate calculation 1. Identify the foods that contain carbohydrates: ? Rice. ? Corn. ? Milk. ? Strawberries. 2. Calculate how many servings you have of each food: ? 2 servings rice. ? 1 serving corn. ? 1 serving milk. ? 1 serving strawberries. 3. Multiply each number of servings by 15 g: ? 2 servings rice x 15 g = 30 g. ? 1 serving corn x 15 g = 15 g. ? 1 serving milk x 15 g = 15 g. ? 1  serving strawberries x 15 g = 15 g. 4. Add together all of the amounts to find the total grams of carbohydrates eaten: ? 30 g + 15 g + 15 g + 15 g = 75 g of carbohydrates total. Summary  Carbohydrate counting is a method of keeping track of how many carbohydrates you eat.  Eating carbohydrates naturally increases the amount of sugar (glucose) in the blood.  Counting how many carbohydrates you eat helps keep your blood glucose within normal limits, which helps you manage your diabetes.  A diet and nutrition specialist (registered dietitian) can help you make a meal plan and calculate how many carbohydrates you should have at each meal and snack. This information is not intended to replace advice given to you by your health care provider. Make sure you discuss any questions you have with your health care provider. Document Revised: 06/22/2017 Document Reviewed: 05/11/2016 Elsevier Patient Education  2020 [employer]  

## 2019-12-31 NOTE — Progress Notes (Signed)
Subjective:    Patient ID: Stephen Richardson, male    DOB: 08/01/1996, 24 y.o.   MRN: [id number]   Chief Complaint: Medical Management of Chronic Issues    HPI:  1. Type 1 diabetes mellitus with complications ([place]) He is not good at all watching his diet. Fasting blood sugar running from 150-205.  He has an upcoming appointment with endocrinology in sept 2021 .Marland Kitchen He also saw nutritionist in January Lab Results  Component Value Date   HGBA1C 8.9 (H) 11/14/2019     2. Essential hypertension No c/o chest pain, sob, or headache. Does not check blood pressure at home. BP Readings from Last 3 Encounters:  10/30/19 (!) 144/82  03/01/19 136/89  02/25/19 135/90     3. Hyperlipidemia associated with type 2 diabetes mellitus ([place]) Does not watch diet and does exercise some. Lab Results  Component Value Date   CHOL 597 (H) 09/30/2019   HDL 10 (L) 09/30/2019   LDLCALC Comment (A) 09/30/2019   LDLDIRECT 18 09/30/2019   TRIG 5,259 (HH) 09/30/2019   CHOLHDL 59.7 (H) 09/30/2019     4. Congenital aortic valve insufficiency Had aortic valve replacement and is doing well.  5. Attention deficit hyperactivity disorder (ADHD), predominantly inattentive type Is on vyvanse and is not able to concentrate and calm down without taking medication.  6. Developmental delay He is not able  To live on his own due t developenetaldelays  7. BMI 37.0-37.9, adult No recent weight changes Wt Readings from Last 3 Encounters:  12/17/19 230 lb (104.3 kg)  10/30/19 226 lb (102.5 kg)  03/01/19 247 lb (112 kg)   BMI Readings from Last 3 Encounters:  12/17/19 37.12 kg/m  10/30/19 36.48 kg/m  03/01/19 39.87 kg/m       Outpatient Encounter Medications as of 12/31/2019  Medication Sig  . Accu-Chek FastClix Lancets MISC USE AS DIRECTED 4 TIMES A DAY  . ACCU-CHEK GUIDE test strip USE AS DIRECTED 4 TIMES A DAY  . blood glucose meter kit and supplies Dispense based on patient and insurance  preference. Use up to four times daily as directed. (FOR ICD-10 E10.9, E11.9).  Marland Kitchen Blood Glucose Monitoring Suppl (ACCU-CHEK GUIDE) w/Device KIT 1 kit by Other route 4 (four) times daily as needed. Dx E11.9  . fenofibrate (TRICOR) 145 MG tablet Take 1 tablet (145 mg total) by mouth daily.  . Insulin Glargine (LANTUS SOLOSTAR) 100 UNIT/ML Solostar Pen Inject 20 Units into the skin at bedtime.  . Insulin Pen Needle (PEN NEEDLES) 31G X 5 MM MISC 1 Device by Does not apply route Nightly.  . lisdexamfetamine (VYVANSE) 50 MG capsule Take 1 capsule (50 mg total) by mouth daily.  Marland Kitchen lisdexamfetamine (VYVANSE) 50 MG capsule Take 1 capsule (50 mg total) by mouth daily.  Marland Kitchen lisdexamfetamine (VYVANSE) 50 MG capsule Take 1 capsule (50 mg total) by mouth daily.  Marland Kitchen lisinopril (ZESTRIL) 5 MG tablet Take 1 tablet (5 mg total) by mouth daily.  . metFORMIN (GLUCOPHAGE) 1000 MG tablet Take 1 tablet (1,000 mg total) by mouth 2 (two) times daily with a meal.  . potassium chloride (KLOR-CON) 10 MEQ tablet TAKE 1 TABLET (10 MEQ TOTAL) BY MOUTH 2 (TWO) TIMES DAILY.     History reviewed. No pertinent surgical history.  Family History  Problem Relation Age of Onset  . Kidney disease Father   . Diabetes Father   . Pancreatitis Father     New complaints: Was in hospital in DKA in November and they  decreased his metformin to 528m BID due to kidney function  Social history: Lives with his mom  Controlled substance contract: patient not abel to sign for himself- will have mom sign the next time he comes in    Review of Systems  Constitutional: Negative for diaphoresis.  Eyes: Negative for pain.  Respiratory: Negative for shortness of breath.   Cardiovascular: Negative for chest pain, palpitations and leg swelling.  Gastrointestinal: Negative for abdominal pain.  Endocrine: Negative for polydipsia.  Skin: Negative for rash.  Neurological: Negative for dizziness, weakness and headaches.  Hematological: Does  not bruise/bleed easily.  All other systems reviewed and are negative.      Objective:   Physical Exam Vitals and nursing note reviewed.  Constitutional:      Appearance: Normal appearance. He is well-developed.  HENT:     Head: Normocephalic.     Nose: Nose normal.  Eyes:     Pupils: Pupils are equal, round, and reactive to light.  Neck:     Thyroid: No thyroid mass or thyromegaly.     Vascular: No carotid bruit or JVD.     Trachea: Phonation normal.  Cardiovascular:     Rate and Rhythm: Normal rate and regular rhythm.  Pulmonary:     Effort: Pulmonary effort is normal. No respiratory distress.     Breath sounds: Normal breath sounds.  Abdominal:     General: Bowel sounds are normal.     Palpations: Abdomen is soft.     Tenderness: There is no abdominal tenderness.  Musculoskeletal:        General: Normal range of motion.     Cervical back: Normal range of motion and neck supple.  Lymphadenopathy:     Cervical: No cervical adenopathy.  Skin:    General: Skin is warm and dry.  Neurological:     Mental Status: He is alert and oriented to person, place, and time.  Psychiatric:        Behavior: Behavior normal.        Thought Content: Thought content normal.        Judgment: Judgment normal.     hgba1c 6.8  BP 139/83   Pulse 82   Temp (!) 97.5 F (36.4 C) (Temporal)   Resp 20   Ht 5' 6"  (1.676 m)   Wt 231 lb (104.8 kg)   SpO2 97%   BMI 37.28 kg/m       Assessment & Plan:  Stephen Kaukcomes in today with chief complaint of Medical Management of Chronic Issues   Diagnosis and orders addressed:  1. Type 1 diabetes mellitus with complications ([facility]) Continue to watch carbs in diet Continue to exercise - Bayer DCA Hb A1c Waived - metFORMIN (GLUCOPHAGE) 500 MG tablet; Take 1 tablet (500 mg total) by mouth 2 (two) times daily with a meal.  Dispense: 180 tablet; Refill: 3  2. Essential hypertension Low sodium diet - CMP14+EGFR - lisinopril (ZESTRIL) 5  MG tablet; Take 1 tablet (5 mg total) by mouth daily.  Dispense: 30 tablet; Refill: 5  3. Hyperlipidemia associated with type 2 diabetes mellitus ([facility]) Low fat diet - Lipid panel - fenofibrate (TRICOR) 145 MG tablet; Take 1 tablet (145 mg total) by mouth daily.  Dispense: 30 tablet; Refill: 5  4. Congenital aortic valve insufficiency Needs to get a new crdiologist due to age  24 Attention deficit hyperactivity disorder (ADHD), predominantly inattentive type stress management - lisdexamfetamine (VYVANSE) 50 MG capsule; Take 1 capsule (50 mg  total) by mouth daily.  Dispense: 30 capsule; Refill: 0 - lisdexamfetamine (VYVANSE) 50 MG capsule; Take 1 capsule (50 mg total) by mouth daily.  Dispense: 30 capsule; Refill: 0 - lisdexamfetamine (VYVANSE) 50 MG capsule; Take 1 capsule (50 mg total) by mouth daily.  Dispense: 30 capsule; Refill: 0  6. Developmental delay  7. BMI 37.0-37.9, adult Discussed diet and exercise for person with BMI >25 Will recheck weight in 3-6 months    Labs pending Health Maintenance reviewed Diet and exercise encouraged  Follow up plan: 3 months   Mary-Margaret Hassell Done, FNP

## 2020-01-01 LAB — LIPID PANEL
Chol/HDL Ratio: 8.5 ratio — ABNORMAL HIGH (ref 0.0–5.0)
Cholesterol, Total: 254 mg/dL — ABNORMAL HIGH (ref 100–199)
HDL: 30 mg/dL — ABNORMAL LOW (ref >39)
LDL Chol Calc (NIH): 123 mg/dL — ABNORMAL HIGH (ref 0–99)
Triglycerides: 560 mg/dL (ref 0–149)
VLDL Cholesterol Cal: 101 mg/dL — ABNORMAL HIGH (ref 5–40)

## 2020-01-01 LAB — CMP14+EGFR
ALT: 7 IU/L (ref 0–44)
AST: 14 IU/L (ref 0–40)
Albumin/Globulin Ratio: 2.1 (ref 1.2–2.2)
Albumin: 4.7 g/dL (ref 4.1–5.2)
Alkaline Phosphatase: 88 IU/L (ref 39–117)
BUN/Creatinine Ratio: 29 — ABNORMAL HIGH (ref 9–20)
BUN: 17 mg/dL (ref 6–20)
Bilirubin Total: 0.3 mg/dL (ref 0.0–1.2)
CO2: 19 mmol/L — ABNORMAL LOW (ref 20–29)
Calcium: 10.2 mg/dL (ref 8.7–10.2)
Chloride: 101 mmol/L (ref 96–106)
Creatinine, Ser: 0.59 mg/dL — ABNORMAL LOW (ref 0.76–1.27)
GFR calc Af Amer: 166 mL/min/{1.73_m2} (ref >59)
GFR calc non Af Amer: 144 mL/min/{1.73_m2} (ref >59)
Globulin, Total: 2.2 g/dL (ref 1.5–4.5)
Glucose: 106 mg/dL — ABNORMAL HIGH (ref 65–99)
Potassium: 4.7 mmol/L (ref 3.5–5.2)
Sodium: 139 mmol/L (ref 134–144)
Total Protein: 6.9 g/dL (ref 6.0–8.5)

## 2020-01-02 MED ORDER — ATORVASTATIN CALCIUM 40 MG PO TABS: 40.0000 mg | ORAL_TABLET | Freq: Every day | ORAL | 1 refills | Status: DC

## 2020-01-02 NOTE — Addendum Note (Signed)
Addended by: Bennie Pierini on: 01/02/2020 01:29 PM   Modules accepted: Orders

## 2020-01-21 ENCOUNTER — Ambulatory Visit: Payer: Medicaid Other | Admitting: Nutrition

## 2020-01-29 ENCOUNTER — Other Ambulatory Visit: Payer: Self-pay | Admitting: Nurse Practitioner

## 2020-01-29 ENCOUNTER — Other Ambulatory Visit: Payer: Self-pay | Admitting: Family Medicine

## 2020-02-10 ENCOUNTER — Other Ambulatory Visit: Payer: Self-pay | Admitting: Family Medicine

## 2020-02-17 ENCOUNTER — Encounter: Payer: Self-pay | Admitting: Nutrition

## 2020-02-17 ENCOUNTER — Other Ambulatory Visit: Payer: Self-pay

## 2020-02-17 ENCOUNTER — Encounter: Payer: Medicaid Other | Attending: Family | Admitting: Nutrition

## 2020-02-17 VITALS — Wt 223.0 lb

## 2020-02-17 DIAGNOSIS — I10 Essential (primary) hypertension: Secondary | ICD-10-CM | POA: Diagnosis not present

## 2020-02-17 DIAGNOSIS — IMO0002 Reserved for concepts with insufficient information to code with codable children: Secondary | ICD-10-CM

## 2020-02-17 DIAGNOSIS — E118 Type 2 diabetes mellitus with unspecified complications: Secondary | ICD-10-CM | POA: Insufficient documentation

## 2020-02-17 DIAGNOSIS — E785 Hyperlipidemia, unspecified: Secondary | ICD-10-CM | POA: Diagnosis not present

## 2020-02-17 DIAGNOSIS — E1165 Type 2 diabetes mellitus with hyperglycemia: Secondary | ICD-10-CM | POA: Insufficient documentation

## 2020-02-17 DIAGNOSIS — E108 Type 1 diabetes mellitus with unspecified complications: Secondary | ICD-10-CM | POA: Diagnosis not present

## 2020-02-17 DIAGNOSIS — E1169 Type 2 diabetes mellitus with other specified complication: Secondary | ICD-10-CM | POA: Diagnosis not present

## 2020-02-17 NOTE — Progress Notes (Signed)
  Medical Nutrition Therapy:  Appt start time: 8101 end time:  1545  Assessment:  Primary concerns today: DM Type 2, Obesity. Here with his mom.   A1C 6.8% Down from 10.6%.   Walks daily Lost 7 lbs. Taking Latnus 20 units a day. Metfomrin 500 mg BID  7 day 133 mg/dl 14 day 168 mg/dl 30 day 157 mg/dl.     Lab Results  Component Value Date   HGBA1C 6.8 12/31/2019   CMP Latest Ref Rng & Units 12/31/2019 10/30/2019 09/30/2019  Glucose 65 - 99 mg/dL 106(H) 185(H) 305(H)  BUN 6 - 20 mg/dL _0 Creatinine 0.76 - 1.27 mg/dL 0.59(L) 0.44(L) 0.66(L)  Sodium 134 - 144 mmol/L 139 139 128(L)  Potassium 3.5 - 5.2 mmol/L 4.7 3.2(L) 4.1  Chloride 96 - 106 mmol/L 101 100 90(L)  CO2 20 - 29 mmol/L 19(L) 22 20  Calcium 8.7 - 10.2 mg/dL 10.2 9.3 8.6(L)  Total Protein 6.0 - 8.5 g/dL 6.9 6.1 6.8  Total Bilirubin 0.0 - 1.2 mg/dL 0.3 0.4 0.3  Alkaline Phos 39 - 117 IU/L 88 109 125(H)  AST 0 - 40 IU/L 14 43(H) 21  ALT 0 - 44 IU/L _1 Lipid Panel     Component Value Date/Time   CHOL 254 (H) 12/31/2019 1100   TRIG 560 (HH) 12/31/2019 1100   HDL 30 (L) 12/31/2019 1100   CHOLHDL 8.5 (H) 12/31/2019 1100   LDLCALC 123 (H) 12/31/2019 1100   LDLDIRECT 18 09/30/2019 1109   LABVLDL 101 (H) 12/31/2019 1100      Preferred Learning Style:   No preference indicated   Learning Readiness:   Ready  Change in progress   MEDICATIONS:   DIETARY INTAKE: Eats 3 meals and snacks usually..  Usual physical activity: walks some  Estimated energy needs: 1800 calories 200 g carbohydrates 135 g protein 50 g fat  Progress Towards Goal(s):  In progress.   Nutritional Diagnosis:  NB-1.1 Food and nutrition-related knowledge deficit As related to Diabetes Type 2.  As evidenced by A1C 8.9%.    Intervention:  Nutrition and Diabetes education provided on My Plate, CHO counting, meal planning, portion sizes, timing of meals, avoiding snacks between meals unless having a low blood sugar, target  ranges for A1C and blood sugars, signs/symptoms and treatment of hyper/hypoglycemia, monitoring blood sugars, taking medications as prescribed, benefits of exercising 30 minutes per day and prevention of complications of DM.  Goals Follow My Plate Eat three balanced meals at times discussed. Drink only water. Cut out snacks, mac/cheese, ramens and processed foods. Increase fresh fruits and vegetables. Test BS 4 times per day and record on sheet Keep food journal and bring next visit with BS logs. Lose 1 lb per week. .  Teaching Method Utilized:  Visual Auditory Hands on  Handouts given during visit include:  The Plate Method   Meal Plan Card  Diabetes Instructions.   Barriers to learning/adherence to lifestyle change: none  Demonstrated degree of understanding via:  Teach Back   Monitoring/Evaluation:  Dietary intake, exercise,  and body weight in 3 month(s). May consider reducing insulin slowly as blood sugars improve and prevent hypoglycemia.

## 2020-02-17 NOTE — Patient Instructions (Signed)
Goals  Eat more carrots, Increase walking No sodas Keep A1C 6.8% or less.

## 2020-02-17 NOTE — Progress Notes (Signed)
  Medical Nutrition Therapy:  Appt start time: 1300 end time:  1400.   Assessment:  Primary concerns today: DM Type 2, Obesity.  Has been walking every day,. Eating more vegetables and celery. Has cut down on eating chips, ice cream and junk food. Drinking only water and no more sodas. Urination is better. Less blurry vision. FBS 90-130's. Hypertriglyceridemia, Hyperlipidemia.    Lab Results  Component Value Date   HGBA1C 6.8 12/31/2019   CMP Latest Ref Rng & Units 12/31/2019 10/30/2019 09/30/2019  Glucose 65 - 99 mg/dL 885(O) 277(A) 128(N)  BUN 6 - 20 mg/dL 17 7 12   Creatinine 0.76 - 1.27 mg/dL ) 8.67(E) 7.20(N)  Sodium 134 - 144 mmol/L 139 139 128(L)  Potassium 3.5 - 5.2 mmol/L 4.7 3.2(L) 4.1  Chloride 96 - 106 mmol/L 101 100 90(L)  CO2 20 - 29 mmol/L 19(L) 22 20  Calcium 8.7 - 10.2 mg/dL 4.70(J 9.3 62.8)  Total Protein 6.0 - 8.5 g/dL 6.9 6.1 6.8  Total Bilirubin 0.0 - 1.2 mg/dL 0.3 0.4 0.3  Alkaline Phos 39 - 117 IU/L 88 109 125(H)  AST 0 - 40 IU/L 14 43(H) 21  ALT 0 - 44 IU/L 7 15 14    Lipid Panel     Component Value Date/Time   CHOL 254 (H) 12/31/2019 1100   TRIG 560 (HH) 12/31/2019 1100   HDL 30 (L) 12/31/2019 1100   CHOLHDL 8.5 (H) 12/31/2019 1100   LDLCALC 123 (H) 12/31/2019 1100   LDLDIRECT 18 09/30/2019 1109   LABVLDL 101 (H) 12/31/2019 1100      Preferred Learning Style:   No preference indicated   Learning Readiness:   Ready  Change in progress   MEDICATIONS:   DIETARY INTAKE: Eats 3 meals and snacks usually..  Usual physical activity: walks some  Estimated energy needs: 1800 calories 200 g carbohydrates 135 g protein 50 g fat  Progress Towards Goal(s):  In progress.   Nutritional Diagnosis:  NB-1.1 Food and nutrition-related knowledge deficit As related to Diabetes Type 2.  As evidenced by A1C 8.9%.    Intervention:  Nutrition and Diabetes education provided on My Plate, CHO counting, meal planning, portion sizes, timing of  meals, avoiding snacks between meals unless having a low blood sugar, target ranges for A1C and blood sugars, signs/symptoms and treatment of hyper/hypoglycemia, monitoring blood sugars, taking medications as prescribed, benefits of exercising 30 minutes per day and prevention of complications of DM.   Goals  Eat more carrots, Increase walking No sodas Keep A1C 6.8% or less.  Teaching Method Utilized:  Visual Auditory Hands on  Handouts given during visit include:  The Plate Method   Meal Plan Card  Diabetes Instructions.   Barriers to learning/adherence to lifestyle change: none  Demonstrated degree of understanding via:  Teach Back   Monitoring/Evaluation:  Dietary intake, exercise,  and body weight in 3 month(s).

## 2020-02-19 ENCOUNTER — Encounter: Payer: Self-pay | Admitting: Nutrition

## 2020-03-11 ENCOUNTER — Other Ambulatory Visit: Payer: Self-pay | Admitting: Nurse Practitioner

## 2020-03-30 ENCOUNTER — Other Ambulatory Visit: Payer: Self-pay

## 2020-03-30 ENCOUNTER — Encounter: Payer: Self-pay | Admitting: Nurse Practitioner

## 2020-03-30 ENCOUNTER — Ambulatory Visit: Payer: Medicaid Other | Admitting: Nurse Practitioner

## 2020-03-30 VITALS — BP 133/78 | HR 88 | Temp 98.6°F | Resp 20 | Ht 66.0 in | Wt 231.0 lb

## 2020-03-30 DIAGNOSIS — Z6837 Body mass index (BMI) 37.0-37.9, adult: Secondary | ICD-10-CM | POA: Diagnosis not present

## 2020-03-30 DIAGNOSIS — I10 Essential (primary) hypertension: Secondary | ICD-10-CM | POA: Diagnosis not present

## 2020-03-30 DIAGNOSIS — E108 Type 1 diabetes mellitus with unspecified complications: Secondary | ICD-10-CM | POA: Diagnosis not present

## 2020-03-30 DIAGNOSIS — E785 Hyperlipidemia, unspecified: Secondary | ICD-10-CM | POA: Diagnosis not present

## 2020-03-30 DIAGNOSIS — E1169 Type 2 diabetes mellitus with other specified complication: Secondary | ICD-10-CM | POA: Diagnosis not present

## 2020-03-30 DIAGNOSIS — F900 Attention-deficit hyperactivity disorder, predominantly inattentive type: Secondary | ICD-10-CM

## 2020-03-30 DIAGNOSIS — E876 Hypokalemia: Secondary | ICD-10-CM | POA: Diagnosis not present

## 2020-03-30 DIAGNOSIS — Q231 Congenital insufficiency of aortic valve: Secondary | ICD-10-CM | POA: Diagnosis not present

## 2020-03-30 DIAGNOSIS — R6250 Unspecified lack of expected normal physiological development in childhood: Secondary | ICD-10-CM

## 2020-03-30 LAB — BAYER DCA HB A1C WAIVED: HB A1C (BAYER DCA - WAIVED): 5.8 % (ref <7.0)

## 2020-03-30 MED ORDER — LISDEXAMFETAMINE DIMESYLATE 50 MG PO CAPS: 50.0000 mg | ORAL_CAPSULE | Freq: Every day | ORAL | 0 refills | Status: DC

## 2020-03-30 MED ORDER — FENOFIBRATE 145 MG PO TABS: 145.0000 mg | ORAL_TABLET | Freq: Every day | ORAL | 5 refills | Status: DC

## 2020-03-30 MED ORDER — METFORMIN HCL 500 MG PO TABS: 500.0000 mg | ORAL_TABLET | Freq: Two times a day (BID) | ORAL | 3 refills | Status: DC

## 2020-03-30 MED ORDER — POTASSIUM CHLORIDE ER 10 MEQ PO TBCR: 10.0000 meq | EXTENDED_RELEASE_TABLET | Freq: Two times a day (BID) | ORAL | 0 refills | Status: DC

## 2020-03-30 MED ORDER — LISINOPRIL 5 MG PO TABS: 5.0000 mg | ORAL_TABLET | Freq: Every day | ORAL | 5 refills | Status: DC

## 2020-03-30 MED ORDER — ATORVASTATIN CALCIUM 40 MG PO TABS: 40.0000 mg | ORAL_TABLET | Freq: Every day | ORAL | 1 refills | Status: DC

## 2020-03-30 MED ORDER — LANTUS SOLOSTAR 100 UNIT/ML ~~LOC~~ SOPN: 20.0000 [IU] | PEN_INJECTOR | Freq: Every day | SUBCUTANEOUS | 6 refills | Status: DC

## 2020-03-30 NOTE — Patient Instructions (Signed)
Diabetes Mellitus and Foot Care Foot care is an important part of your health, especially when you have diabetes. Diabetes may cause you to have problems because of poor blood flow (circulation) to your feet and legs, which can cause your skin to:  Become thinner and drier.  Break more easily.  Heal more slowly.  Peel and crack. You may also have nerve damage (neuropathy) in your legs and feet, causing decreased feeling in them. This means that you may not notice minor injuries to your feet that could lead to more serious problems. Noticing and addressing any potential problems early is the best way to prevent future foot problems. How to care for your feet Foot hygiene  Wash your feet daily with warm water and mild soap. Do not use hot water. Then, pat your feet and the areas between your toes until they are completely dry. Do not soak your feet as this can dry your skin.  Trim your toenails straight across. Do not dig under them or around the cuticle. File the edges of your nails with an emery board or nail file.  Apply a moisturizing lotion or petroleum jelly to the skin on your feet and to dry, brittle toenails. Use lotion that does not contain alcohol and is unscented. Do not apply lotion between your toes. Shoes and socks  Wear clean socks or stockings every day. Make sure they are not too tight. Do not wear knee-high stockings since they may decrease blood flow to your legs.  Wear shoes that fit properly and have enough cushioning. Always look in your shoes before you put them on to be sure there are no objects inside.  To break in new shoes, wear them for just a few hours a day. This prevents injuries on your feet. Wounds, scrapes, corns, and calluses  Check your feet daily for blisters, cuts, bruises, sores, and redness. If you cannot see the bottom of your feet, use a mirror or ask someone for help.  Do not cut corns or calluses or try to remove them with medicine.  If you  find a minor scrape, cut, or break in the skin on your feet, keep it and the skin around it clean and dry. You may clean these areas with mild soap and water. Do not clean the area with peroxide, alcohol, or iodine.  If you have a wound, scrape, corn, or callus on your foot, look at it several times a day to make sure it is healing and not infected. Check for: ? Redness, swelling, or pain. ? Fluid or blood. ? Warmth. ? Pus or a bad smell. General instructions  Do not cross your legs. This may decrease blood flow to your feet.  Do not use heating pads or hot water bottles on your feet. They may burn your skin. If you have lost feeling in your feet or legs, you may not know this is happening until it is too late.  Protect your feet from hot and cold by wearing shoes, such as at the beach or on hot pavement.  Schedule a complete foot exam at least once a year (annually) or more often if you have foot problems. If you have foot problems, report any cuts, sores, or bruises to your health care provider immediately. Contact a health care provider if:  You have a medical condition that increases your risk of infection and you have any cuts, sores, or bruises on your feet.  You have an injury that is not   healing.  You have redness on your legs or feet.  You feel burning or tingling in your legs or feet.  You have pain or cramps in your legs and feet.  Your legs or feet are numb.  Your feet always feel cold.  You have pain around a toenail. Get help right away if:  You have a wound, scrape, corn, or callus on your foot and: ? You have pain, swelling, or redness that gets worse. ? You have fluid or blood coming from the wound, scrape, corn, or callus. ? Your wound, scrape, corn, or callus feels warm to the touch. ? You have pus or a bad smell coming from the wound, scrape, corn, or callus. ? You have a fever. ? You have a red line going up your leg. Summary  Check your feet every day  for cuts, sores, red spots, swelling, and blisters.  Moisturize feet and legs daily.  Wear shoes that fit properly and have enough cushioning.  If you have foot problems, report any cuts, sores, or bruises to your health care provider immediately.  Schedule a complete foot exam at least once a year (annually) or more often if you have foot problems. This information is not intended to replace advice given to you by your health care provider. Make sure you discuss any questions you have with your health care provider. Document Revised: 08/21/2019 Document Reviewed: 12/30/2016 [employer] Patient Education  2020 [employer]  

## 2020-03-30 NOTE — Progress Notes (Signed)
Subjective:    Patient ID: Stephen Richardson, male    DOB: [birth year], 24 y.o.   MRN: [id number]   Chief Complaint: Medical Management of Chronic Issues    HPI:  1. Essential hypertension No c/o chest pain, sob or headache. Does not check blood pressure at home. BP Readings from Last 3 Encounters:  12/31/19 139/83  10/30/19 (!) 144/82  03/01/19 136/89     2. Hyperlipidemia associated with type 2 diabetes mellitus ([place]) treis to watch diet , he has been trying to walk and play basketball everyday Lab Results  Component Value Date   CHOL 254 (H) 12/31/2019   HDL 30 (L) 12/31/2019   LDLCALC 123 (H) 12/31/2019   LDLDIRECT 18 09/30/2019   TRIG 560 (HH) 12/31/2019   CHOLHDL 8.5 (H) 12/31/2019     3. Type 1 diabetes mellitus with complications (HCC) Fasting blood sugars are around 140. He denies any symptoms of low blood sugars. Lab Results  Component Value Date   HGBA1C 6.8 12/31/2019     4. Bicuspid aortic valve Had valve replacement several years ago an dis doing well. Has not seen heart doctor in several years. Needs new physician because he is an adult now.  5. Attention deficit hyperactivity disorder (ADHD), predominantly inattentive type Is still taking vyvanse daily allow him to focus. He denies any side effects form medication.  6. Developmental delay He is not able to take care of himself or manage his own finances. Lives with his mom.  7. BMI 37.0-37.9, adult Weight is down 8lbs. Wt Readings from Last 3 Encounters:  02/17/20 223 lb (101.2 kg)  12/31/19 231 lb (104.8 kg)  12/17/19 230 lb (104.3 kg)   BMI Readings from Last 3 Encounters:  03/30/20 37.28 kg/m  02/17/20 35.99 kg/m  12/31/19 37.28 kg/m     8. Hypokalemia Denies any lower ext cramping. Takes daily potassium supplement. Lab Results  Component Value Date   K 4.7 12/31/2019       Outpatient Encounter Medications as of 03/30/2020  Medication Sig  . Accu-Chek FastClix Lancets MISC  Test BS QID Dx E10.8  . ACCU-CHEK GUIDE test strip USE AS DIRECTED 4 TIMES A DAY  . atorvastatin (LIPITOR) 40 MG tablet Take 1 tablet (40 mg total) by mouth daily.  . blood glucose meter kit and supplies Dispense based on patient and insurance preference. Use up to four times daily as directed. (FOR ICD-10 E10.9, E11.9).  Marland Kitchen Blood Glucose Monitoring Suppl (ACCU-CHEK GUIDE) w/Device KIT 1 kit by Other route 4 (four) times daily as needed. Dx E11.9  . fenofibrate (TRICOR) 145 MG tablet Take 1 tablet (145 mg total) by mouth daily.  . Insulin Glargine (LANTUS SOLOSTAR) 100 UNIT/ML Solostar Pen Inject 20 Units into the skin at bedtime.  . Insulin Pen Needle (PEN NEEDLES) 31G X 5 MM MISC 1 Device by Does not apply route Nightly.  . lisdexamfetamine (VYVANSE) 50 MG capsule Take 1 capsule (50 mg total) by mouth daily.  Marland Kitchen lisdexamfetamine (VYVANSE) 50 MG capsule Take 1 capsule (50 mg total) by mouth daily.  Marland Kitchen lisdexamfetamine (VYVANSE) 50 MG capsule Take 1 capsule (50 mg total) by mouth daily.  Marland Kitchen lisinopril (ZESTRIL) 5 MG tablet Take 1 tablet (5 mg total) by mouth daily.  . metFORMIN (GLUCOPHAGE) 500 MG tablet Take 1 tablet (500 mg total) by mouth 2 (two) times daily with a meal.  . potassium chloride (KLOR-CON) 10 MEQ tablet TAKE 1 TABLET (10 MEQ TOTAL) BY MOUTH 2 (TWO)  TIMES DAILY.   History reviewed. No pertinent surgical history.  Family History  Problem Relation Age of Onset  . Kidney disease Father   . Diabetes Father   . Pancreatitis Father     New complaints: None today  Social history: Lives with his mom  Controlled substance contract: mom not here to sign and he cannot sign hisself.    Review of Systems  Constitutional: Negative for diaphoresis.  Eyes: Negative for pain.  Respiratory: Negative for shortness of breath.   Cardiovascular: Negative for chest pain, palpitations and leg swelling.  Gastrointestinal: Negative for abdominal pain.  Endocrine: Negative for polydipsia.    Skin: Negative for rash.  Neurological: Negative for dizziness, weakness and headaches.  Hematological: Does not bruise/bleed easily.  All other systems reviewed and are negative.      Objective:   Physical Exam Vitals and nursing note reviewed.  Constitutional:      Appearance: Normal appearance. He is well-developed.  HENT:     Head: Normocephalic.     Nose: Nose normal.  Eyes:     Pupils: Pupils are equal, round, and reactive to light.  Neck:     Thyroid: No thyroid mass or thyromegaly.     Vascular: No carotid bruit or JVD.     Trachea: Phonation normal.  Cardiovascular:     Rate and Rhythm: Normal rate and regular rhythm.     Comments: artificial heart valve audible Pulmonary:     Effort: Pulmonary effort is normal. No respiratory distress.     Breath sounds: Normal breath sounds.  Abdominal:     General: Bowel sounds are normal.     Palpations: Abdomen is soft.     Tenderness: There is no abdominal tenderness.  Musculoskeletal:        General: Normal range of motion.     Cervical back: Normal range of motion and neck supple.  Lymphadenopathy:     Cervical: No cervical adenopathy.  Skin:    General: Skin is warm and dry.  Neurological:     Mental Status: He is alert and oriented to person, place, and time.  Psychiatric:        Behavior: Behavior normal.        Thought Content: Thought content normal.        Judgment: Judgment normal.    BP 133/78   Pulse 88   Temp 98.6 F (37 C) (Temporal)   Resp 20   Ht _0  (1.676 m)   Wt 231 lb (104.8 kg)   BMI 37.28 kg/m   hgba1c 5.8       Assessment & Plan:  Hawkins Seaman comes in today with chief complaint of Medical Management of Chronic Issues   Diagnosis and orders addressed:  1. Essential hypertension Low sodium diet - CBC with Differential/Platelet - CMP14+EGFR - lisinopril (ZESTRIL) 5 MG tablet; Take 1 tablet (5 mg total) by mouth daily.  Dispense: 30 tablet; Refill: 5  2. Hyperlipidemia  associated with type 2 diabetes mellitus (HCC) Continue low fat diet - Lipid panel - atorvastatin (LIPITOR) 40 MG tablet; Take 1 tablet (40 mg total) by mouth daily.  Dispense: 90 tablet; Refill: 1 - fenofibrate (TRICOR) 145 MG tablet; Take 1 tablet (145 mg total) by mouth daily.  Dispense: 30 tablet; Refill: 5  3. Type 1 diabetes mellitus with complications (HCC) Continue to watch carbs in diet - Bayer DCA Hb A1c Waived - metFORMIN (GLUCOPHAGE) 500 MG tablet; Take 1 tablet (500 mg total) by  mouth 2 (two) times daily with a meal.  Dispense: 180 tablet; Refill: 3 - insulin glargine (LANTUS SOLOSTAR) 100 UNIT/ML Solostar Pen; Inject 20 Units into the skin at bedtime.  Dispense: 5 pen; Refill: 6  4. Bicuspid aortic valve Cardiology referral to have heart valve checked - Ambulatory referral to Cardiology  5. Attention deficit hyperactivity disorder (ADHD), predominantly inattentive type Stress management - lisdexamfetamine (VYVANSE) 50 MG capsule; Take 1 capsule (50 mg total) by mouth daily.  Dispense: 30 capsule; Refill: 0 - lisdexamfetamine (VYVANSE) 50 MG capsule; Take 1 capsule (50 mg total) by mouth daily.  Dispense: 30 capsule; Refill: 0 - lisdexamfetamine (VYVANSE) 50 MG capsule; Take 1 capsule (50 mg total) by mouth daily.  Dispense: 30 capsule; Refill: 0  6. Developmental delay  7. BMI 37.0-37.9, adult Discussed diet and exercise for person with BMI >25 Will recheck weight in 3-6 months  8. Hypokalemia - potassium chloride (KLOR-CON) 10 MEQ tablet; Take 1 tablet (10 mEq total) by mouth 2 (two) times daily.  Dispense: 30 tablet; Refill: 0   Labs pending Health Maintenance reviewed Diet and exercise encouraged  Follow up plan: 3 months   Mary-Margaret Hassell Done, FNP

## 2020-03-31 LAB — CBC WITH DIFFERENTIAL/PLATELET
Basophils Absolute: 0.1 10*3/uL (ref 0.0–0.2)
Basos: 1 %
EOS (ABSOLUTE): 0.1 10*3/uL (ref 0.0–0.4)
Eos: 1 %
Hematocrit: 46.6 % (ref 37.5–51.0)
Hemoglobin: 16 g/dL (ref 13.0–17.7)
Immature Grans (Abs): 0 10*3/uL (ref 0.0–0.1)
Immature Granulocytes: 0 %
Lymphocytes Absolute: 2.3 10*3/uL (ref 0.7–3.1)
Lymphs: 34 %
MCH: 29.1 pg (ref 26.6–33.0)
MCHC: 34.3 g/dL (ref 31.5–35.7)
MCV: 85 fL (ref 79–97)
Monocytes Absolute: 0.5 10*3/uL (ref 0.1–0.9)
Monocytes: 7 %
Neutrophils Absolute: 3.8 10*3/uL (ref 1.4–7.0)
Neutrophils: 57 %
Platelets: 371 10*3/uL (ref 150–450)
RBC: 5.5 x10E6/uL (ref 4.14–5.80)
RDW: 13.4 % (ref 11.6–15.4)
WBC: 6.7 10*3/uL (ref 3.4–10.8)

## 2020-03-31 LAB — CMP14+EGFR
ALT: 8 IU/L (ref 0–44)
AST: 18 IU/L (ref 0–40)
Albumin/Globulin Ratio: 1.9 (ref 1.2–2.2)
Albumin: 4.6 g/dL (ref 4.1–5.2)
Alkaline Phosphatase: 95 IU/L (ref 39–117)
BUN/Creatinine Ratio: 32 — ABNORMAL HIGH (ref 9–20)
BUN: 18 mg/dL (ref 6–20)
Bilirubin Total: 0.3 mg/dL (ref 0.0–1.2)
CO2: 22 mmol/L (ref 20–29)
Calcium: 9.9 mg/dL (ref 8.7–10.2)
Chloride: 99 mmol/L (ref 96–106)
Creatinine, Ser: 0.57 mg/dL — ABNORMAL LOW (ref 0.76–1.27)
GFR calc Af Amer: 169 mL/min/{1.73_m2} (ref >59)
GFR calc non Af Amer: 146 mL/min/{1.73_m2} (ref >59)
Globulin, Total: 2.4 g/dL (ref 1.5–4.5)
Glucose: 107 mg/dL — ABNORMAL HIGH (ref 65–99)
Potassium: 4.8 mmol/L (ref 3.5–5.2)
Sodium: 139 mmol/L (ref 134–144)
Total Protein: 7 g/dL (ref 6.0–8.5)

## 2020-03-31 LAB — LIPID PANEL
Chol/HDL Ratio: 8.4 ratio — ABNORMAL HIGH (ref 0.0–5.0)
Cholesterol, Total: 245 mg/dL — ABNORMAL HIGH (ref 100–199)
HDL: 29 mg/dL — ABNORMAL LOW (ref >39)
LDL Chol Calc (NIH): 119 mg/dL — ABNORMAL HIGH (ref 0–99)
Triglycerides: 547 mg/dL — ABNORMAL HIGH (ref 0–149)
VLDL Cholesterol Cal: 97 mg/dL — ABNORMAL HIGH (ref 5–40)

## 2020-04-12 NOTE — Progress Notes (Signed)
Cardiology Office Note:   Date:  04/14/2020  NAME:  Stephen Richardson    MRN: [id number] DOB:  May 25, 1996   PCP:  Chevis Pretty, FNP  Cardiologist:  No primary care provider on file.  Referring MD: Chevis Pretty, *   Chief Complaint  Patient presents with  . New Patient (Initial Visit)   History of Present Illness:   Stephen Richardson is a 24 y.o. male with a hx of bicuspid AoV?, HTN, obesity, type 1 diabetes who is being seen today for the evaluation of bicuspid AoV at the request of Hassell Done, Mary-Margaret, *. Rather interesting records from [facility] which initially mention congenital aortic stenosis and bicuspid AoV. Repeat echo at [facility] 03/16/2018 showed tricuspid AoV and trivial regurgitation without stenosis. He presents with his mother.  Both are confused what heart condition he actually had in the past.  We did go over the recent notes from [facility] which demonstrated that he likely does not have a bicuspid aortic valve and has a tricuspid aortic valve.  He has never had any heart procedures.  He has no symptoms or chest pain concerning for cardiac disease.  His EKG today demonstrates normal sinus rhythm with no acute changes.  He has no murmur on examination either. I did review his recent lipid profile which is significant for hypertriglyceridemia.  He does have triglycerides on 547.  I did go over diet with him.  Apparently he does eat a lot of fried and fatty foods.  He does eat bacon daily.  He also eats at restaurants which I suspect is more than what he tells me.  I have recommended against further fatty foods or any other high fat content diet.  He does not drink beer or alcohol.  He is currently not working.  He has completed high school.  He does live with his mother. His diabetes is well controlled.  Most recent A1c 5.8.  He seems to be doing well with this.  He is on lisinopril for what I presume is renal protection.  Blood pressure today is 130/75.  He is tolerating a statin as well as  fenofibrate.  Problem List 1. Bicuspid AoV? -possible fusion of RCC/LCC vs tricuspid valve per [facility] notes 03/15/2018 -TTE 03/16/2018 -> Tricuspid AoV @ [facility]  2. Aortic regurgitation  -trace per reports at [facility] 3. HTN 4. HLD -Total cholesterol 245, HDL 29, LDL 18, triglycerides 547, A1c 5.8 5. T1 DM -A1c 5.8  Past Medical History: Past Medical History:  Diagnosis Date  . ADD (attention deficit disorder with hyperactivity)   . Chest pain on exertion   . Hyperlipidemia   . ODD (oppositional defiant disorder)     Past Surgical History: History reviewed. No pertinent surgical history.  Current Medications: Current Meds  Medication Sig  . Accu-Chek FastClix Lancets MISC Test BS QID Dx E10.8  . ACCU-CHEK GUIDE test strip USE AS DIRECTED 4 TIMES A DAY  . atorvastatin (LIPITOR) 40 MG tablet Take 1 tablet (40 mg total) by mouth daily.  . blood glucose meter kit and supplies Dispense based on patient and insurance preference. Use up to four times daily as directed. (FOR ICD-10 E10.9, E11.9).  Marland Kitchen Blood Glucose Monitoring Suppl (ACCU-CHEK GUIDE) w/Device KIT 1 kit by Other route 4 (four) times daily as needed. Dx E11.9  . fenofibrate (TRICOR) 145 MG tablet Take 1 tablet (145 mg total) by mouth daily.  . insulin glargine (LANTUS SOLOSTAR) 100 UNIT/ML Solostar Pen Inject 20 Units into the skin at bedtime.  Marland Kitchen  Insulin Pen Needle (PEN NEEDLES) 31G X 5 MM MISC 1 Device by Does not apply route Nightly.  Derrill Memo ON 05/29/2020] lisdexamfetamine (VYVANSE) 50 MG capsule Take 1 capsule (50 mg total) by mouth daily.  Derrill Memo ON 04/29/2020] lisdexamfetamine (VYVANSE) 50 MG capsule Take 1 capsule (50 mg total) by mouth daily.  Marland Kitchen lisdexamfetamine (VYVANSE) 50 MG capsule Take 1 capsule (50 mg total) by mouth daily.  Marland Kitchen lisinopril (ZESTRIL) 5 MG tablet Take 1 tablet (5 mg total) by mouth daily.  . metFORMIN (GLUCOPHAGE) 500 MG tablet Take 1 tablet (500 mg total) by mouth 2 (two) times daily with a meal.  .  potassium chloride (KLOR-CON) 10 MEQ tablet Take 1 tablet (10 mEq total) by mouth 2 (two) times daily.     Allergies:    Patient has no known allergies.   Social History: Social History   Socioeconomic History  . Marital status: Single    Spouse name: Not on file  . Number of children: Not on file  . Years of education: Not on file  . Highest education level: Not on file  Occupational History  . Not on file  Tobacco Use  . Smoking status: Never Smoker  . Smokeless tobacco: Never Used  Substance and Sexual Activity  . Alcohol use: No  . Drug use: No  . Sexual activity: Never  Other Topics Concern  . Not on file  Social History Narrative  . Not on file   Social Determinants of Health   Financial Resource Strain:   . Difficulty of Paying Living Expenses:   Food Insecurity:   . Worried About Charity fundraiser in the Last Year:   . Arboriculturist in the Last Year:   Transportation Needs:   . Film/video editor (Medical):   Marland Kitchen Lack of Transportation (Non-Medical):   Physical Activity:   . Days of Exercise per Week:   . Minutes of Exercise per Session:   Stress:   . Feeling of Stress :   Social Connections:   . Frequency of Communication with Friends and Family:   . Frequency of Social Gatherings with Friends and Family:   . Attends Religious Services:   . Active Member of Clubs or Organizations:   . Attends Archivist Meetings:   Marland Kitchen Marital Status:     Family History: The patient's family history includes Diabetes in his father; Kidney disease in his father; Pancreatitis in his father.  ROS:   All other ROS reviewed and negative. Pertinent positives noted in the HPI.     EKGs/Labs/Other Studies Reviewed:   The following studies were personally reviewed by me today:  EKG:  EKG is ordered today.  The ekg ordered today demonstrates normal sinus rhythm, rate 92, no acute ST-T changes, no evidence for infarction, and was personally reviewed by me.    Recent Labs: 03/30/2020: ALT 8; BUN 18; Creatinine, Ser 0.57; Hemoglobin 16.0; Platelets 371; Potassium 4.8; Sodium 139   Recent Lipid Panel    Component Value Date/Time   CHOL 245 (H) 03/30/2020 1054   TRIG 547 (H) 03/30/2020 1054   HDL 29 (L) 03/30/2020 1054   CHOLHDL 8.4 (H) 03/30/2020 1054   LDLCALC 119 (H) 03/30/2020 1054   LDLDIRECT 18 09/30/2019 1109   Physical Exam:   VS:  BP 130/75   Pulse 92   Temp (!) 96.8 F (36 C)   Ht 5' 6"  (1.676 m)   Wt 234 lb 6.4 oz (106.3  kg)   SpO2 96%   BMI 37.83 kg/m    Wt Readings from Last 3 Encounters:  04/14/20 234 lb 6.4 oz (106.3 kg)  03/30/20 231 lb (104.8 kg)  02/17/20 223 lb (101.2 kg)    General: Well nourished, well developed, in no acute distress Heart: Atraumatic, normal size  Eyes: PEERLA, EOMI  Neck: Supple, no JVD Endocrine: No thryomegaly Cardiac: Normal S1, S2; RRR; no murmurs, rubs, or gallops Lungs: Clear to auscultation bilaterally, no wheezing, rhonchi or rales  Abd: Soft, nontender, no hepatomegaly  Ext: No edema, pulses 2+ Musculoskeletal: No deformities, BUE and BLE strength normal and equal Skin: Warm and dry, no rashes   Neuro: Alert and oriented to person, place, time, and situation, CNII-XII grossly intact, no focal deficits  Psych: Normal mood and affect   ASSESSMENT:   Steffon Gladu is a 24 y.o. male who presents for the following: 1. Bicuspid aortic valve   2. Mixed hyperlipidemia     PLAN:   1. Bicuspid aortic valve -review of records shows he likely has a tricuspid AoV. We will plan to settle this with a repeat TTE here that we can personally review.  -no clicks or murmur on exam. EKG normal.   2. Mixed hyperlipidemia -TG 547. On statin and fibrate. Will work on diet and I will see him back in 3 months. He will have his repeat lipids drawn by PCP and bring result to [place]. He will continue lipitor 80 mg QD and tricor for now.   Disposition: Return in about 3 months (around  07/15/2020).  Medication Adjustments/Labs and Tests Ordered: Current medicines are reviewed at length with the patient today.  Concerns regarding medicines are outlined above.  Orders Placed This Encounter  Procedures  . EKG 12-Lead  . ECHOCARDIOGRAM COMPLETE   No orders of the defined types were placed in this encounter.   Patient Instructions  Medication Instructions:  The current medical regimen is effective;  continue present plan and medications.  *If you need a refill on your cardiac medications before your next appointment, please call your pharmacy*  Lab: Have PCP fax over lab work to [phone] before follow up.     Testing/Procedures: Echocardiogram - Your physician has requested that you have an echocardiogram. Echocardiography is a painless test that uses sound waves to create images of your heart. It provides your doctor with information about the size and shape of your heart and how well your heart's chambers and valves are working. This procedure takes approximately one hour. There are no restrictions for this procedure. This will be performed at our [facility] location - [address].   Follow-Up: At [facility], you and your health needs are our priority.  As part of our continuing mission to provide you with exceptional heart care, we have created designated Provider Care Teams.  These Care Teams include your primary Cardiologist (physician) and Advanced Practice Providers (APPs -  Physician Assistants and Nurse Practitioners) who all work together to provide you with the care you need, when you need it.  We recommend signing up for the patient portal called "MyChart".  Sign up information is provided on this After Visit Summary.  MyChart is used to connect with patients for Virtual Visits (Telemedicine).  Patients are able to view lab/test results, encounter notes, upcoming appointments, etc.  Non-urgent messages can be sent to your provider as well.    To learn more about what you can do with MyChart,  go to [email or web address].    Your next appointment:   3 month(s)  The format for your next appointment:   In Person  Provider:   Eleonore Chiquito, MD        Signed, Addison Naegeli. Audie Box, [place]  [address] [place],  [address] [phone]  04/14/2020 4:34 PM

## 2020-04-14 ENCOUNTER — Ambulatory Visit (INDEPENDENT_AMBULATORY_CARE_PROVIDER_SITE_OTHER): Payer: Medicaid Other | Admitting: Cardiovascular Disease

## 2020-04-14 ENCOUNTER — Other Ambulatory Visit: Payer: Self-pay

## 2020-04-14 ENCOUNTER — Encounter: Payer: Self-pay | Admitting: Cardiovascular Disease

## 2020-04-14 VITALS — BP 130/75 | HR 92 | Temp 96.8°F | Ht 66.0 in | Wt 234.4 lb

## 2020-04-14 DIAGNOSIS — E782 Mixed hyperlipidemia: Secondary | ICD-10-CM

## 2020-04-14 DIAGNOSIS — Q231 Congenital insufficiency of aortic valve: Secondary | ICD-10-CM | POA: Diagnosis not present

## 2020-04-14 DIAGNOSIS — I10 Essential (primary) hypertension: Secondary | ICD-10-CM

## 2020-04-14 NOTE — Patient Instructions (Signed)
Medication Instructions:  The current medical regimen is effective;  continue present plan and medications.  *If you need a refill on your cardiac medications before your next appointment, please call your pharmacy*  Lab: Have PCP fax over lab work to [phone] before follow up.     Testing/Procedures: Echocardiogram - Your physician has requested that you have an echocardiogram. Echocardiography is a painless test that uses sound waves to create images of your heart. It provides your doctor with information about the size and shape of your heart and how well your heart's chambers and valves are working. This procedure takes approximately one hour. There are no restrictions for this procedure. This will be performed at our [facility] location - [address].   Follow-Up: At [facility], you and your health needs are our priority.  As part of our continuing mission to provide you with exceptional heart care, we have created designated Provider Care Teams.  These Care Teams include your primary Cardiologist (physician) and Advanced Practice Providers (APPs -  Physician Assistants and Nurse Practitioners) who all work together to provide you with the care you need, when you need it.  We recommend signing up for the patient portal called "MyChart".  Sign up information is provided on this After Visit Summary.  MyChart is used to connect with patients for Virtual Visits (Telemedicine).  Patients are able to view lab/test results, encounter notes, upcoming appointments, etc.  Non-urgent messages can be sent to your provider as well.   To learn more about what you can do with MyChart, go to [email or web address].    Your next appointment:   3 month(s)  The format for your next appointment:   In Person  Provider:   Lennie Odor, MD

## 2020-05-08 ENCOUNTER — Ambulatory Visit (HOSPITAL_COMMUNITY): Payer: Medicaid Other | Attending: Cardiology

## 2020-05-08 ENCOUNTER — Other Ambulatory Visit: Payer: Self-pay

## 2020-05-08 DIAGNOSIS — Q231 Congenital insufficiency of aortic valve: Secondary | ICD-10-CM | POA: Insufficient documentation

## 2020-05-25 ENCOUNTER — Ambulatory Visit: Payer: Medicaid Other | Admitting: Nutrition

## 2020-06-29 ENCOUNTER — Encounter: Payer: Self-pay | Admitting: Nurse Practitioner

## 2020-06-29 ENCOUNTER — Ambulatory Visit: Payer: Medicaid Other | Admitting: Nurse Practitioner

## 2020-06-29 ENCOUNTER — Other Ambulatory Visit: Payer: Self-pay

## 2020-06-29 VITALS — BP 133/78 | HR 97 | Temp 99.3°F | Resp 20 | Ht 66.0 in | Wt 236.0 lb

## 2020-06-29 DIAGNOSIS — I10 Essential (primary) hypertension: Secondary | ICD-10-CM

## 2020-06-29 DIAGNOSIS — E1169 Type 2 diabetes mellitus with other specified complication: Secondary | ICD-10-CM | POA: Diagnosis not present

## 2020-06-29 DIAGNOSIS — F900 Attention-deficit hyperactivity disorder, predominantly inattentive type: Secondary | ICD-10-CM | POA: Diagnosis not present

## 2020-06-29 DIAGNOSIS — E108 Type 1 diabetes mellitus with unspecified complications: Secondary | ICD-10-CM

## 2020-06-29 DIAGNOSIS — E785 Hyperlipidemia, unspecified: Secondary | ICD-10-CM

## 2020-06-29 DIAGNOSIS — E876 Hypokalemia: Secondary | ICD-10-CM

## 2020-06-29 LAB — BAYER DCA HB A1C WAIVED: HB A1C (BAYER DCA - WAIVED): 5.8 % (ref <7.0)

## 2020-06-29 MED ORDER — POTASSIUM CHLORIDE ER 10 MEQ PO TBCR: 10.0000 meq | EXTENDED_RELEASE_TABLET | Freq: Two times a day (BID) | ORAL | 1 refills | Status: DC

## 2020-06-29 MED ORDER — LISDEXAMFETAMINE DIMESYLATE 50 MG PO CAPS: 50.0000 mg | ORAL_CAPSULE | Freq: Every day | ORAL | 0 refills | Status: DC

## 2020-06-29 MED ORDER — LISINOPRIL 5 MG PO TABS: 5.0000 mg | ORAL_TABLET | Freq: Every day | ORAL | 1 refills | Status: DC

## 2020-06-29 MED ORDER — FENOFIBRATE 145 MG PO TABS: 145.0000 mg | ORAL_TABLET | Freq: Every day | ORAL | 1 refills | Status: DC

## 2020-06-29 MED ORDER — ATORVASTATIN CALCIUM 40 MG PO TABS: 40.0000 mg | ORAL_TABLET | Freq: Every day | ORAL | 1 refills | Status: DC

## 2020-06-29 NOTE — Patient Instructions (Signed)
Diabetes Mellitus and Foot Care Foot care is an important part of your health, especially when you have diabetes. Diabetes may cause you to have problems because of poor blood flow (circulation) to your feet and legs, which can cause your skin to:  Become thinner and drier.  Break more easily.  Heal more slowly.  Peel and crack. You may also have nerve damage (neuropathy) in your legs and feet, causing decreased feeling in them. This means that you may not notice minor injuries to your feet that could lead to more serious problems. Noticing and addressing any potential problems early is the best way to prevent future foot problems. How to care for your feet Foot hygiene  Wash your feet daily with warm water and mild soap. Do not use hot water. Then, pat your feet and the areas between your toes until they are completely dry. Do not soak your feet as this can dry your skin.  Trim your toenails straight across. Do not dig under them or around the cuticle. File the edges of your nails with an emery board or nail file.  Apply a moisturizing lotion or petroleum jelly to the skin on your feet and to dry, brittle toenails. Use lotion that does not contain alcohol and is unscented. Do not apply lotion between your toes. Shoes and socks  Wear clean socks or stockings every day. Make sure they are not too tight. Do not wear knee-high stockings since they may decrease blood flow to your legs.  Wear shoes that fit properly and have enough cushioning. Always look in your shoes before you put them on to be sure there are no objects inside.  To break in new shoes, wear them for just a few hours a day. This prevents injuries on your feet. Wounds, scrapes, corns, and calluses  Check your feet daily for blisters, cuts, bruises, sores, and redness. If you cannot see the bottom of your feet, use a mirror or ask someone for help.  Do not cut corns or calluses or try to remove them with medicine.  If you  find a minor scrape, cut, or break in the skin on your feet, keep it and the skin around it clean and dry. You may clean these areas with mild soap and water. Do not clean the area with peroxide, alcohol, or iodine.  If you have a wound, scrape, corn, or callus on your foot, look at it several times a day to make sure it is healing and not infected. Check for: ? Redness, swelling, or pain. ? Fluid or blood. ? Warmth. ? Pus or a bad smell. General instructions  Do not cross your legs. This may decrease blood flow to your feet.  Do not use heating pads or hot water bottles on your feet. They may burn your skin. If you have lost feeling in your feet or legs, you may not know this is happening until it is too late.  Protect your feet from hot and cold by wearing shoes, such as at the beach or on hot pavement.  Schedule a complete foot exam at least once a year (annually) or more often if you have foot problems. If you have foot problems, report any cuts, sores, or bruises to your health care provider immediately. Contact a health care provider if:  You have a medical condition that increases your risk of infection and you have any cuts, sores, or bruises on your feet.  You have an injury that is not   healing.  You have redness on your legs or feet.  You feel burning or tingling in your legs or feet.  You have pain or cramps in your legs and feet.  Your legs or feet are numb.  Your feet always feel cold.  You have pain around a toenail. Get help right away if:  You have a wound, scrape, corn, or callus on your foot and: ? You have pain, swelling, or redness that gets worse. ? You have fluid or blood coming from the wound, scrape, corn, or callus. ? Your wound, scrape, corn, or callus feels warm to the touch. ? You have pus or a bad smell coming from the wound, scrape, corn, or callus. ? You have a fever. ? You have a red line going up your leg. Summary  Check your feet every day  for cuts, sores, red spots, swelling, and blisters.  Moisturize feet and legs daily.  Wear shoes that fit properly and have enough cushioning.  If you have foot problems, report any cuts, sores, or bruises to your health care provider immediately.  Schedule a complete foot exam at least once a year (annually) or more often if you have foot problems. This information is not intended to replace advice given to you by your health care provider. Make sure you discuss any questions you have with your health care provider. Document Revised: 08/21/2019 Document Reviewed: 12/30/2016 Elsevier Patient Education  2020 [employer] DASH Eating Plan DASH stands for "Dietary Approaches to Stop Hypertension." The DASH eating plan is a healthy eating plan that has been shown to reduce high blood pressure (hypertension). It may also reduce your risk for type 2 diabetes, heart disease, and stroke. The DASH eating plan may also help with weight loss. What are tips for following this plan?  General guidelines  Avoid eating more than 2,300 mg (milligrams) of salt (sodium) a day. If you have hypertension, you may need to reduce your sodium intake to 1,500 mg a day.  Limit alcohol intake to no more than 1 drink a day for nonpregnant women and 2 drinks a day for men. One drink equals 12 oz of beer, 5 oz of wine, or 1 oz of hard liquor.  Work with your health care provider to maintain a healthy body weight or to lose weight. Ask what an ideal weight is for you.  Get at least 30 minutes of exercise that causes your heart to beat faster (aerobic exercise) most days of the week. Activities may include walking, swimming, or biking.  Work with your health care provider or diet and nutrition specialist (dietitian) to adjust your eating plan to your individual calorie needs. Reading food labels   Check food labels for the amount of sodium per serving. Choose foods with less than 5 percent of the Daily Value of  sodium. Generally, foods with less than 300 mg of sodium per serving fit into this eating plan.  To find whole grains, look for the word "whole" as the first word in the ingredient list. Shopping  Buy products labeled as "low-sodium" or "no salt added."  Buy fresh foods. Avoid canned foods and premade or frozen meals. Cooking  Avoid adding salt when cooking. Use salt-free seasonings or herbs instead of table salt or sea salt. Check with your health care provider or pharmacist before using salt substitutes.  Do not fry foods. Cook foods using healthy methods such as baking, boiling, grilling, and broiling instead.  Cook with heart-healthy oils, such   as olive, canola, soybean, or sunflower oil. Meal planning  Eat a balanced diet that includes: ? 5 or more servings of fruits and vegetables each day. At each meal, try to fill half of your plate with fruits and vegetables. ? Up to 6-8 servings of whole grains each day. ? Less than 6 oz of lean meat, poultry, or fish each day. A 3-oz serving of meat is about the same size as a deck of cards. One egg equals 1 oz. ? 2 servings of low-fat dairy each day. ? A serving of nuts, seeds, or beans 5 times each week. ? Heart-healthy fats. Healthy fats called Omega-3 fatty acids are found in foods such as flaxseeds and coldwater fish, like sardines, salmon, and mackerel.  Limit how much you eat of the following: ? Canned or prepackaged foods. ? Food that is high in trans fat, such as fried foods. ? Food that is high in saturated fat, such as fatty meat. ? Sweets, desserts, sugary drinks, and other foods with added sugar. ? Full-fat dairy products.  Do not salt foods before eating.  Try to eat at least 2 vegetarian meals each week.  Eat more home-cooked food and less restaurant, buffet, and fast food.  When eating at a restaurant, ask that your food be prepared with less salt or no salt, if possible. What foods are recommended? The items listed  may not be a complete list. Talk with your dietitian about what dietary choices are best for you. Grains Whole-grain or whole-wheat bread. Whole-grain or whole-wheat pasta. Brown rice. Oatmeal. Quinoa. Bulgur. Whole-grain and low-sodium cereals. Pita bread. Low-fat, low-sodium crackers. Whole-wheat flour tortillas. Vegetables Fresh or frozen vegetables (raw, steamed, roasted, or grilled). Low-sodium or reduced-sodium tomato and vegetable juice. Low-sodium or reduced-sodium tomato sauce and tomato paste. Low-sodium or reduced-sodium canned vegetables. Fruits All fresh, dried, or frozen fruit. Canned fruit in natural juice (without added sugar). Meat and other protein foods Skinless chicken or turkey. Ground chicken or turkey. Pork with fat trimmed off. Fish and seafood. Egg whites. Dried beans, peas, or lentils. Unsalted nuts, nut butters, and seeds. Unsalted canned beans. Lean cuts of beef with fat trimmed off. Low-sodium, lean deli meat. Dairy Low-fat (1%) or fat-free (skim) milk. Fat-free, low-fat, or reduced-fat cheeses. Nonfat, low-sodium ricotta or cottage cheese. Low-fat or nonfat yogurt. Low-fat, low-sodium cheese. Fats and oils Soft margarine without trans fats. Vegetable oil. Low-fat, reduced-fat, or light mayonnaise and salad dressings (reduced-sodium). Canola, safflower, olive, soybean, and sunflower oils. Avocado. Seasoning and other foods Herbs. Spices. Seasoning mixes without salt. Unsalted popcorn and pretzels. Fat-free sweets. What foods are not recommended? The items listed may not be a complete list. Talk with your dietitian about what dietary choices are best for you. Grains Baked goods made with fat, such as croissants, muffins, or some breads. Dry pasta or rice meal packs. Vegetables Creamed or fried vegetables. Vegetables in a cheese sauce. Regular canned vegetables (not low-sodium or reduced-sodium). Regular canned tomato sauce and paste (not low-sodium or reduced-sodium).  Regular tomato and vegetable juice (not low-sodium or reduced-sodium). Pickles. Olives. Fruits Canned fruit in a light or heavy syrup. Fried fruit. Fruit in cream or butter sauce. Meat and other protein foods Fatty cuts of meat. Ribs. Fried meat. Bacon. Sausage. Bologna and other processed lunch meats. Salami. Fatback. Hotdogs. Bratwurst. Salted nuts and seeds. Canned beans with added salt. Canned or smoked fish. Whole eggs or egg yolks. Chicken or turkey with skin. Dairy Whole or 2% milk, cream, and   half-and-half. Whole or full-fat cream cheese. Whole-fat or sweetened yogurt. Full-fat cheese. Nondairy creamers. Whipped toppings. Processed cheese and cheese spreads. Fats and oils Butter. Stick margarine. Lard. Shortening. Ghee. Bacon fat. Tropical oils, such as coconut, palm kernel, or palm oil. Seasoning and other foods Salted popcorn and pretzels. Onion salt, garlic salt, seasoned salt, table salt, and sea salt. Worcestershire sauce. Tartar sauce. Barbecue sauce. Teriyaki sauce. Soy sauce, including reduced-sodium. Steak sauce. Canned and packaged gravies. Fish sauce. Oyster sauce. Cocktail sauce. Horseradish that you find on the shelf. Ketchup. Mustard. Meat flavorings and tenderizers. Bouillon cubes. Hot sauce and Tabasco sauce. Premade or packaged marinades. Premade or packaged taco seasonings. Relishes. Regular salad dressings. Where to find more information:  National Heart, Lung, and Blood Institute: www.nhlbi.nih.gov  American Heart Association: www.heart.org Summary  The DASH eating plan is a healthy eating plan that has been shown to reduce high blood pressure (hypertension). It may also reduce your risk for type 2 diabetes, heart disease, and stroke.  With the DASH eating plan, you should limit salt (sodium) intake to 2,300 mg a day. If you have hypertension, you may need to reduce your sodium intake to 1,500 mg a day.  When on the DASH eating plan, aim to eat more fresh fruits and  vegetables, whole grains, lean proteins, low-fat dairy, and heart-healthy fats.  Work with your health care provider or diet and nutrition specialist (dietitian) to adjust your eating plan to your individual calorie needs. This information is not intended to replace advice given to you by your health care provider. Make sure you discuss any questions you have with your health care provider. Document Revised: 11/10/2017 Document Reviewed: 11/21/2016 Elsevier Patient Education  2020 Elsevier Inc.  

## 2020-06-29 NOTE — Progress Notes (Signed)
Subjective:    Patient ID: Stephen Richardson, male    DOB: 1996/08/04, 24 y.o.   MRN: [id number]   Chief Complaint: Medical Management of Chronic Issues    HPI:  1. Essential hypertension BP Readings from Last 3 Encounters:  06/29/20 133/78  04/14/20 130/75  03/30/20 133/78   Checks blood pressure regularly, takes medication as prescribed, follows a cardiologist. Denies chest pain, SOB, or weakness. Avoids salt.   2. Hyperlipidemia associated with type 2 diabetes mellitus ([place]) Lab Results  Component Value Date   CHOL 245 (H) 03/30/2020   HDL 29 (L) 03/30/2020   LDLCALC 119 (H) 03/30/2020   LDLDIRECT 18 09/30/2019   TRIG 547 (H) 03/30/2020   CHOLHDL 8.4 (H) 03/30/2020   Avoids fried and fatty foods, takes medication as prescribed.   3. Type 1 diabetes mellitus with complications [facility]) Lab Results  Component Value Date   HGBA1C 5.8 03/30/2020   Today's HA1C is 5.8. Follow up with endocrinologist, checks blood sugar regularly, and takes medication as prescribed.   4. ADHD Is on vyvanse daily and cannot stay focused without meds. 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   5. 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  Outpatient Encounter Medications as of 06/29/2020  Medication Sig  . Accu-Chek FastClix Lancets MISC Test BS QID Dx E10.8  . ACCU-CHEK GUIDE test strip USE AS DIRECTED 4 TIMES A DAY  . atorvastatin (LIPITOR) 40 MG tablet Take 1 tablet (40 mg total) by mouth daily.  . blood glucose meter kit and supplies Dispense based on patient and insurance preference. Use up to four times daily as directed. (FOR ICD-10 E10.9, E11.9).  Marland Kitchen Blood Glucose Monitoring Suppl (ACCU-CHEK GUIDE) w/Device  KIT 1 kit by Other route 4 (four) times daily as needed. Dx E11.9  . fenofibrate (TRICOR) 145 MG tablet Take 1 tablet (145 mg total) by mouth daily.  . insulin glargine (LANTUS SOLOSTAR) 100 UNIT/ML Solostar Pen Inject 20 Units into the skin at bedtime.  . Insulin Pen Needle (PEN NEEDLES) 31G X 5 MM MISC 1 Device by Does not apply route Nightly.  Marland Kitchen lisinopril (ZESTRIL) 5 MG tablet Take 1 tablet (5 mg total) by mouth daily.  . metFORMIN (GLUCOPHAGE) 500 MG tablet Take 1 tablet (500 mg total) by mouth 2 (two) times daily with a meal.  . potassium chloride (KLOR-CON) 10 MEQ tablet Take 1 tablet (10 mEq total) by mouth 2 (two) times daily.  Marland Kitchen lisdexamfetamine (VYVANSE) 50 MG capsule Take 1 capsule (50 mg total) by mouth daily.  Marland Kitchen lisdexamfetamine (VYVANSE) 50 MG capsule Take 1 capsule (50 mg total) by mouth daily.  Marland Kitchen lisdexamfetamine (VYVANSE) 50 MG capsule Take 1 capsule (50 mg total) by mouth daily.   No facility-administered encounter medications on file as of 06/29/2020.    History reviewed. No pertinent surgical history.  Family History  Problem Relation Age of Onset  . Kidney disease Father   . Diabetes Father   . Pancreatitis Father     New complaints: No new complaints today, needs eye exam  Social history: Lives at home with mother, plays video games and basketball.   Controlled substance contract: signed 06/29/2020  Review of Systems  Constitutional: Negative.   HENT: Negative.   Eyes: Negative.   Respiratory: Negative.   Cardiovascular: Negative.   Gastrointestinal: Negative.   Endocrine: Negative.   Genitourinary: Negative.   Musculoskeletal: Negative.   Skin: Negative.   Allergic/Immunologic: Negative.   Neurological: Negative.   Hematological: Negative.   Psychiatric/Behavioral: Negative.   All other systems reviewed and are negative.      Objective:   Physical Exam Vitals and nursing note reviewed.  Constitutional:      Appearance: He is obese.    HENT:     Head: Normocephalic and atraumatic.     Right Ear: Tympanic membrane normal.     Left Ear: Tympanic membrane normal.     Nose: Nose normal.     Mouth/Throat:     Mouth: Mucous membranes are moist.     Pharynx: Oropharynx is clear.  Eyes:     Extraocular Movements: Extraocular movements intact.     Pupils: Pupils are equal, round, and reactive to light.  Cardiovascular:     Rate and Rhythm: Normal rate and regular rhythm.     Pulses: Normal pulses.     Heart sounds: Normal heart sounds.  Pulmonary:     Effort: Pulmonary effort is normal.     Breath sounds: Normal breath sounds.  Abdominal:     General: Bowel sounds are normal.  Musculoskeletal:        General: Normal range of motion.     Cervical back: Normal range of motion.  Skin:    General: Skin is warm and dry.     Capillary Refill: Capillary refill takes less than 2 seconds.  Neurological:     General: No focal deficit present.     Mental Status: He is alert and oriented to person, place, and time. Mental status is at baseline.  Psychiatric:        Mood and Affect: Mood normal.        Behavior: Behavior normal.        Thought Content: Thought content normal.        Judgment: Judgment normal.    BP 133/78   Pulse 97   Temp 99.3 F (37.4 C) (Temporal)   Resp 20   Ht _0  (1.676 m)   Wt 236 lb (107 kg)   SpO2 96%   BMI 38.09 kg/m       Assessment & Plan:  Stephen Richardson comes in today with chief complaint of Medical Management of Chronic Issues   Diagnosis and orders addressed:  1. Essential hypertension Follow a heart healthy diet that is low in salt. Check blood pressure regularly. Take medication as prescribed. Follow up with cardiologist. Be as active as possible. Walking is a great cardiovascular exercise that lowers blood pressure, blood glucose levels, and cholesterol levels.  - CBC with Differential/Platelet - CMP14+EGFR  2. Hyperlipidemia associated with type 2 diabetes mellitus  ([place]) Avoid foods that are fried and high in fat. Continue taking medication as prescribed.  - Lipid panel  3. Type 1 diabetes mellitus with complications ([facility]) Check blood glucose regularly. Take medication as prescribed. Avoid foods that have a high carb and sugar content. Continue to work with endocrinologist. - Bayer DCA Hb A1c Waived  4. ADHD Continue vyvanse as rx  5. BMI Discussed diet and exercise for person with BMI >25 Will recheck weight in 3-6 months   Labs pending Health Maintenance reviewed- will schedule eye exam Diet and exercise encouraged  Follow  up plan: Follow up in 3 months.    Mary-Margaret Hassell Done, FNP

## 2020-06-30 LAB — CBC WITH DIFFERENTIAL/PLATELET
Basophils Absolute: 0.1 10*3/uL (ref 0.0–0.2)
Basos: 1 %
EOS (ABSOLUTE): 0.1 10*3/uL (ref 0.0–0.4)
Eos: 2 %
Hematocrit: 46.3 % (ref 37.5–51.0)
Hemoglobin: 15.6 g/dL (ref 13.0–17.7)
Immature Grans (Abs): 0 10*3/uL (ref 0.0–0.1)
Immature Granulocytes: 0 %
Lymphocytes Absolute: 2.6 10*3/uL (ref 0.7–3.1)
Lymphs: 38 %
MCH: 28.6 pg (ref 26.6–33.0)
MCHC: 33.7 g/dL (ref 31.5–35.7)
MCV: 85 fL (ref 79–97)
Monocytes Absolute: 0.5 10*3/uL (ref 0.1–0.9)
Monocytes: 7 %
Neutrophils Absolute: 3.6 10*3/uL (ref 1.4–7.0)
Neutrophils: 52 %
Platelets: 342 10*3/uL (ref 150–450)
RBC: 5.45 x10E6/uL (ref 4.14–5.80)
RDW: 13.4 % (ref 11.6–15.4)
WBC: 6.9 10*3/uL (ref 3.4–10.8)

## 2020-06-30 LAB — CMP14+EGFR
ALT: 8 IU/L (ref 0–44)
AST: 14 IU/L (ref 0–40)
Albumin/Globulin Ratio: 1.9 (ref 1.2–2.2)
Albumin: 4.6 g/dL (ref 4.1–5.2)
Alkaline Phosphatase: 89 IU/L (ref 48–121)
BUN/Creatinine Ratio: 23 — ABNORMAL HIGH (ref 9–20)
BUN: 12 mg/dL (ref 6–20)
Bilirubin Total: 0.3 mg/dL (ref 0.0–1.2)
CO2: 22 mmol/L (ref 20–29)
Calcium: 9.8 mg/dL (ref 8.7–10.2)
Chloride: 98 mmol/L (ref 96–106)
Creatinine, Ser: 0.53 mg/dL — ABNORMAL LOW (ref 0.76–1.27)
GFR calc Af Amer: 174 mL/min/{1.73_m2} (ref >59)
GFR calc non Af Amer: 150 mL/min/{1.73_m2} (ref >59)
Globulin, Total: 2.4 g/dL (ref 1.5–4.5)
Glucose: 80 mg/dL (ref 65–99)
Potassium: 4.6 mmol/L (ref 3.5–5.2)
Sodium: 138 mmol/L (ref 134–144)
Total Protein: 7 g/dL (ref 6.0–8.5)

## 2020-06-30 LAB — LIPID PANEL
Chol/HDL Ratio: 8 ratio — ABNORMAL HIGH (ref 0.0–5.0)
Cholesterol, Total: 240 mg/dL — ABNORMAL HIGH (ref 100–199)
HDL: 30 mg/dL — ABNORMAL LOW (ref >39)
LDL Chol Calc (NIH): 97 mg/dL (ref 0–99)
Triglycerides: 667 mg/dL (ref 0–149)
VLDL Cholesterol Cal: 113 mg/dL — ABNORMAL HIGH (ref 5–40)

## 2020-07-20 ENCOUNTER — Ambulatory Visit: Payer: Medicaid Other | Admitting: Nutrition

## 2020-07-21 NOTE — Progress Notes (Signed)
Cardiology Office Note:   Date:  07/24/2020  NAME:  Stephen Richardson    MRN: [id number] DOB:  1996-03-28   PCP:  Chevis Pretty, FNP  Cardiologist:  No primary care provider on file.   Referring MD: Chevis Pretty, *   Chief Complaint  Patient presents with  . Hyperlipidemia   History of Present Illness:   Stephen Richardson is a 24 y.o. male with a hx of DM, HLD who presents for follow-up. 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 Triglycerides elevated.  He presents with his mother.  We did go over the results of his ultrasound which show a tricuspid aortic valve.  He does not have a bicuspid aortic valve.  Main issue now is his hypertriglyceridemia.  He has significantly elevated triglycerides.  We did discuss the risk of this including pancreatitis as well as the development of coronary heart disease.  We do need to get on top of this early.  His diabetes is well controlled with a recent A1c of 5.8.  He denies any chest pain, shortness of breath or palpitations today.  He reports he is not exercising.  He and his mother report that the diet has improved.  He is working on cutting back on fried and fatty foods.  Overall from what he tells me his diet is good.  Regarding his lipid-lowering agents he is on Lipitor 40 mg daily, and fenofibrate 145 mg daily.  I have this is  Problem List 1. 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 HTN 3. HLD -Total cholesterol 245, HDL 29, LDL 18, triglycerides 547 4. T1 DM -A1c 5.8  Past Medical History: Past Medical History:  Diagnosis Date  . ADD (attention deficit disorder with hyperactivity)   . Chest pain on exertion   . Hyperlipidemia   . ODD (oppositional defiant disorder)     Past Surgical History: History reviewed. No pertinent surgical history.  Current Medications: Current Meds  Medication Sig  . Accu-Chek FastClix Lancets MISC Test  BS QID Dx E10.8  . ACCU-CHEK GUIDE test strip USE AS DIRECTED 4 TIMES A DAY  . atorvastatin (LIPITOR) 80 MG tablet Take 1 tablet (80 mg total) by mouth daily.  . blood glucose meter kit and supplies Dispense based on patient and insurance preference. Use up to four times daily as directed. (FOR ICD-10 E10.9, E11.9).  Marland Kitchen Blood Glucose Monitoring Suppl (ACCU-CHEK GUIDE) w/Device KIT 1 kit by Other route 4 (four) times daily as needed. Dx E11.9  . fenofibrate (TRICOR) 145 MG tablet Take 1 tablet (145 mg total) by mouth daily.  . insulin glargine (LANTUS SOLOSTAR) 100 UNIT/ML Solostar Pen Inject 20 Units into the skin at bedtime.  . Insulin Pen Needle (PEN NEEDLES) 31G X 5 MM MISC 1 Device by Does not apply route Nightly.  Derrill Memo ON 08/28/2020] lisdexamfetamine (VYVANSE) 50 MG capsule Take 1 capsule (50 mg total) by mouth daily.  Marland Kitchen lisinopril (ZESTRIL) 5 MG tablet Take 1 tablet (5 mg total) by mouth daily.  . metFORMIN (GLUCOPHAGE) 500 MG tablet Take 1 tablet (500 mg total) by mouth 2 (two) times daily with a meal.  . potassium chloride (KLOR-CON) 10 MEQ tablet Take 1 tablet (10 mEq total) by mouth 2 (two) times daily.  . [DISCONTINUED] atorvastatin (LIPITOR) 40 MG tablet Take 1 tablet (40 mg total) by mouth daily.  . [DISCONTINUED] lisdexamfetamine (VYVANSE)  50 MG capsule Take 1 capsule (50 mg total) by mouth daily.  . [DISCONTINUED] lisdexamfetamine (VYVANSE) 50 MG capsule Take 1 capsule (50 mg total) by mouth daily.     Allergies:    Patient has no known allergies.   Social History: Social History   Socioeconomic History  . Marital status: Single    Spouse name: Not on file  . Number of children: Not on file  . Years of education: Not on file  . Highest education level: Not on file  Occupational History  . Not on file  Tobacco Use  . Smoking status: Never Smoker  . Smokeless tobacco: Never Used  Substance and Sexual Activity  . Alcohol use: No  . Drug use: No  . Sexual activity:  Never  Other Topics Concern  . Not on file  Social History Narrative  . Not on file   Social Determinants of Health   Financial Resource Strain:   . Difficulty of Paying Living Expenses:   Food Insecurity:   . Worried About Charity fundraiser in the Last Year:   . Arboriculturist in the Last Year:   Transportation Needs:   . Film/video editor (Medical):   Marland Kitchen Lack of Transportation (Non-Medical):   Physical Activity:   . Days of Exercise per Week:   . Minutes of Exercise per Session:   Stress:   . Feeling of Stress :   Social Connections:   . Frequency of Communication with Friends and Family:   . Frequency of Social Gatherings with Friends and Family:   . Attends Religious Services:   . Active Member of Clubs or Organizations:   . Attends Archivist Meetings:   Marland Kitchen Marital Status:      Family History: The patient's family history includes Diabetes in his father; Kidney disease in his father; Pancreatitis in his father.  ROS:   All other ROS reviewed and negative. Pertinent positives noted in the HPI.     EKGs/Labs/Other Studies Reviewed:   The following studies were personally reviewed by me today:  TTE 05/08/2020 1. Left ventricular ejection fraction, by estimation, is 60 to 65%. The  left ventricle has normal function. The left ventricle has no regional  wall motion abnormalities. Left ventricular diastolic parameters were  normal.  2. Right ventricular systolic function is normal. The right ventricular  size is normal.  3. The mitral valve is normal in structure. Mild mitral valve  regurgitation. No evidence of mitral stenosis.  4. The aortic valve apprears tricuspid. Aortic valve regurgitation is not  visualized. No aortic stenosis is present.  5. The inferior vena cava is normal in size with greater than 50%  respiratory variability, suggesting right atrial pressure of 3 mmHg.  Recent Labs: 06/29/2020: ALT 8; BUN 12; Creatinine, Ser 0.53;  Hemoglobin 15.6; Platelets 342; Potassium 4.6; Sodium 138   Recent Lipid Panel    Component Value Date/Time   CHOL 240 (H) 06/29/2020 1429   TRIG 667 (HH) 06/29/2020 1429   HDL 30 (L) 06/29/2020 1429   CHOLHDL 8.0 (H) 06/29/2020 1429   LDLCALC 97 06/29/2020 1429   LDLDIRECT 18 09/30/2019 1109    Physical Exam:   VS:  BP 130/86 (BP Location: Left Arm, Patient Position: Sitting, Cuff Size: Normal)   Pulse 91   Ht 5' 8"  (1.727 m)   Wt 210 lb 3.2 oz (95.3 kg)   SpO2 96%   BMI 31.96 kg/m    Wt Readings from  Last 3 Encounters:  07/24/20 210 lb 3.2 oz (95.3 kg)  06/29/20 236 lb (107 kg)  04/14/20 234 lb 6.4 oz (106.3 kg)    General: Well nourished, well developed, in no acute distress Heart: Atraumatic, normal size  Eyes: PEERLA, EOMI  Neck: Supple, no JVD Endocrine: No thryomegaly Cardiac: Normal S1, S2; RRR; no murmurs, rubs, or gallops Lungs: Clear to auscultation bilaterally, no wheezing, rhonchi or rales  Abd: Soft, nontender, no hepatomegaly  Ext: No edema, pulses 2+ Musculoskeletal: No deformities, BUE and BLE strength normal and equal Skin: Warm and dry, no rashes   Neuro: Alert and oriented to person, place, time, and situation, CNII-XII grossly intact, no focal deficits  Psych: Normal mood and affect   ASSESSMENT:   Stephen Richardson is a 24 y.o. male who presents for the following: 1. Mixed hyperlipidemia   2. Hypertriglyceridemia   3. Hyperlipidemia associated with type 2 diabetes mellitus ([place])     PLAN:   1. Mixed hyperlipidemia 2. Hypertriglyceridemia 3. Hyperlipidemia associated with type 2 diabetes mellitus ([place]) -Main issue is triglycerides.  Levels above 500.  He is at risk for pancreatitis.  Also he is at risk for heart disease with levels above 150.  I would like to increase his Lipitor to 80 mg daily.  We will add Vascepa 2 g twice daily.  I will continue his TriCor.  I think this will get him to a good level.  He will continue to work on his weight loss  goals.  He does not drink alcohol.  He also works on his diet.  He reports no increased fatty foods.  We will plan to see him back in 3 months with a fasting lipid profile 1 week before.  I think we can get him to go with his new regimen.  If we cannot get him to goal, we will consider referral to the advanced lipid clinic here with Dr. Mali Hilty.   Disposition: Return in about 3 months (around 10/24/2020).  Medication Adjustments/Labs and Tests Ordered: Current medicines are reviewed at length with the patient today.  Concerns regarding medicines are outlined above.  Orders Placed This Encounter  Procedures  . Lipid panel  . Direct LDL   Meds ordered this encounter  Medications  . atorvastatin (LIPITOR) 80 MG tablet    Sig: Take 1 tablet (80 mg total) by mouth daily.    Dispense:  90 tablet    Refill:  1  . icosapent Ethyl (VASCEPA) 1 g capsule    Sig: Take 2 capsules (2 g total) by mouth 2 (two) times daily.    Dispense:  360 capsule    Refill:  1    Patient Instructions  Medication Instructions:  Increase Lipitor to 80 mg daily  Start Vascepa 2 g twice daily   *If you need a refill on your cardiac medications before your next appointment, please call your pharmacy*   Lab Work: LIPID (one week before follow up in 3 months)   If you have labs (blood work) drawn today and your tests are completely normal, you will receive your results only by: Marland Kitchen MyChart Message (if you have MyChart) OR . A paper copy in the mail If you have any lab test that is abnormal or we need to change your treatment, we will call you to review the results.   Follow-Up: At [facility], you and your health needs are our priority.  As part of our continuing mission to provide you with  exceptional heart care, we have created designated Provider Care Teams.  These Care Teams include your primary Cardiologist (physician) and Advanced Practice Providers (APPs -  Physician Assistants and Nurse  Practitioners) who all work together to provide you with the care you need, when you need it.  We recommend signing up for the patient portal called "MyChart".  Sign up information is provided on this After Visit Summary.  MyChart is used to connect with patients for Virtual Visits (Telemedicine).  Patients are able to view lab/test results, encounter notes, upcoming appointments, etc.  Non-urgent messages can be sent to your provider as well.   To learn more about what you can do with MyChart, go to [email or web address].    Your next appointment:   3 month(s)  The format for your next appointment:   In Person  Provider:   Eleonore Chiquito, MD         Time Spent with Patient: I have spent a total of 35 minutes with patient reviewing hospital notes, telemetry, EKGs, labs and examining the patient as well as establishing an assessment and plan that was discussed with the patient.  > 50% of time was spent in direct patient care.  Signed, Addison Naegeli. Audie Box, [place]  [address] [place], [place] [address] [phone]  07/24/2020 11:45 AM

## 2020-07-24 ENCOUNTER — Other Ambulatory Visit: Payer: Self-pay

## 2020-07-24 ENCOUNTER — Ambulatory Visit (INDEPENDENT_AMBULATORY_CARE_PROVIDER_SITE_OTHER): Payer: Medicaid Other | Admitting: Cardiovascular Disease

## 2020-07-24 ENCOUNTER — Encounter: Payer: Self-pay | Admitting: Cardiovascular Disease

## 2020-07-24 VITALS — BP 130/86 | HR 91 | Ht 68.0 in | Wt 210.2 lb

## 2020-07-24 DIAGNOSIS — E785 Hyperlipidemia, unspecified: Secondary | ICD-10-CM | POA: Diagnosis not present

## 2020-07-24 DIAGNOSIS — E782 Mixed hyperlipidemia: Secondary | ICD-10-CM

## 2020-07-24 DIAGNOSIS — E1169 Type 2 diabetes mellitus with other specified complication: Secondary | ICD-10-CM | POA: Diagnosis not present

## 2020-07-24 DIAGNOSIS — E781 Pure hyperglyceridemia: Secondary | ICD-10-CM

## 2020-07-24 MED ORDER — ATORVASTATIN CALCIUM 80 MG PO TABS: 80.0000 mg | ORAL_TABLET | Freq: Every day | ORAL | 1 refills | Status: DC

## 2020-07-24 MED ORDER — ICOSAPENT ETHYL 1 G PO CAPS
2.0000 g | ORAL_CAPSULE | Freq: Two times a day (BID) | ORAL | 1 refills | Status: DC
Start: 2020-07-24 — End: 2020-08-05

## 2020-07-24 NOTE — Patient Instructions (Signed)
Medication Instructions:  Increase Lipitor to 80 mg daily  Start Vascepa 2 g twice daily   *If you need a refill on your cardiac medications before your next appointment, please call your pharmacy*   Lab Work: LIPID (one week before follow up in 3 months)   If you have labs (blood work) drawn today and your tests are completely normal, you will receive your results only by: Marland Kitchen MyChart Message (if you have MyChart) OR . A paper copy in the mail If you have any lab test that is abnormal or we need to change your treatment, we will call you to review the results.   Follow-Up: At [facility], you and your health needs are our priority.  As part of our continuing mission to provide you with exceptional heart care, we have created designated Provider Care Teams.  These Care Teams include your primary Cardiologist (physician) and Advanced Practice Providers (APPs -  Physician Assistants and Nurse Practitioners) who all work together to provide you with the care you need, when you need it.  We recommend signing up for the patient portal called "MyChart".  Sign up information is provided on this After Visit Summary.  MyChart is used to connect with patients for Virtual Visits (Telemedicine).  Patients are able to view lab/test results, encounter notes, upcoming appointments, etc.  Non-urgent messages can be sent to your provider as well.   To learn more about what you can do with MyChart, go to [email or web address].    Your next appointment:   3 month(s)  The format for your next appointment:   In Person  Provider:   Lennie Odor, MD

## 2020-08-05 ENCOUNTER — Telehealth: Payer: Self-pay

## 2020-08-05 MED ORDER — ICOSAPENT ETHYL 1 G PO CAPS: 2.0000 g | ORAL_CAPSULE | Freq: Two times a day (BID) | ORAL | 1 refills | Status: DC

## 2020-08-05 NOTE — Telephone Encounter (Signed)
Received notification that Vascepa was approved via PA request.  Message from plan: PA Case: [id number], Status: Approved, Coverage Starts on: 08/05/2020 12:00:00 AM, Coverage Ends on: 08/05/2021 12:00:00 AM.  Will resend to pharmacy with approval from PA.

## 2020-08-13 ENCOUNTER — Ambulatory Visit: Payer: Self-pay | Admitting: Endocrinology

## 2020-09-24 ENCOUNTER — Encounter: Payer: Self-pay | Admitting: Endocrinology

## 2020-09-24 ENCOUNTER — Other Ambulatory Visit: Payer: Self-pay

## 2020-09-24 ENCOUNTER — Ambulatory Visit: Payer: Medicaid Other | Admitting: Endocrinology

## 2020-09-24 VITALS — BP 162/100 | HR 92 | Ht 65.0 in | Wt 242.0 lb

## 2020-09-24 DIAGNOSIS — E108 Type 1 diabetes mellitus with unspecified complications: Secondary | ICD-10-CM | POA: Diagnosis not present

## 2020-09-24 DIAGNOSIS — E1169 Type 2 diabetes mellitus with other specified complication: Secondary | ICD-10-CM

## 2020-09-24 DIAGNOSIS — E785 Hyperlipidemia, unspecified: Secondary | ICD-10-CM

## 2020-09-24 LAB — POCT GLYCOSYLATED HEMOGLOBIN (HGB A1C): Hemoglobin A1C: 7.2 % — AB (ref 4.0–5.6)

## 2020-09-24 MED ORDER — LANTUS SOLOSTAR 100 UNIT/ML ~~LOC~~ SOPN: 20.0000 [IU] | PEN_INJECTOR | SUBCUTANEOUS | 3 refills | Status: DC

## 2020-09-24 NOTE — Patient Instructions (Addendum)
Your blood pressure is high today.  Please see your primary care provider soon, to have it rechecked good diet and exercise significantly improve the control of your diabetes.  please let me know if you wish to be referred to a dietician.  high blood sugar is very risky to your health.  you should see an eye doctor and dentist every year.  It is very important to get all recommended vaccinations.  Controlling your blood pressure and cholesterol drastically reduces the damage diabetes does to your body.  Those who smoke should quit.  Please discuss these with your doctor.  check your blood sugar 4 times a day.  vary the time of day when you check, between before the 3 meals, and at bedtime.  also check if you have symptoms of your blood sugar being too high or too low.  please keep a record of the readings and bring it to your next appointment here (or you can bring the meter itself).  You can write it on any piece of paper.  please call us sooner if your blood sugar goes below 70, or if you have a lot of readings over 200.   Please change the Lantus to 20 units each morning.   On this type of insulin schedule, you should eat meals on a regular schedule.  If a meal is missed or significantly delayed, your blood sugar could go low.   Please come back for a follow-up appointment in 3 months.

## 2020-09-24 NOTE — Progress Notes (Signed)
Subjective:    Patient ID: Stephen Richardson, male    DOB: 1996/11/21, 24 y.o.   MRN: [id number]  HPI pt is referred by Evelina Dun, NP, for diabetes.  Pt states DM was dx'ed in 2020, when he presented with DKA; he is unaware of any chronic complications; he has been on insulin since dx; pt says his diet and exercise are not good; he has never had pancreatitis, pancreatic surgery, or severe hypoglycemia.  Mother provides much of hx, due to developmental delay.  However, pt checks his own cbg, and gives his own insulin.  Pt says cbg varies from 85-150.  It is in general higher as the day goes on.  He sometimes forgets insulin.   Past Medical History:  Diagnosis Date  . ADD (attention deficit disorder with hyperactivity)   . Chest pain on exertion   . Hyperlipidemia   . ODD (oppositional defiant disorder)     No past surgical history on file.  Social History   Socioeconomic History  . Marital status: Single    Spouse name: Not on file  . Number of children: Not on file  . Years of education: Not on file  . Highest education level: Not on file  Occupational History  . Not on file  Tobacco Use  . Smoking status: Never Smoker  . Smokeless tobacco: Never Used  Substance and Sexual Activity  . Alcohol use: No  . Drug use: No  . Sexual activity: Never  Other Topics Concern  . Not on file  Social History Narrative  . Not on file   Social Determinants of Health   Financial Resource Strain:   . Difficulty of Paying Living Expenses: Not on file  Food Insecurity:   . Worried About Charity fundraiser in the Last Year: Not on file  . Ran Out of Food in the Last Year: Not on file  Transportation Needs:   . Lack of Transportation (Medical): Not on file  . Lack of Transportation (Non-Medical): Not on file  Physical Activity:   . Days of Exercise per Week: Not on file  . Minutes of Exercise per Session: Not on file  Stress:   . Feeling of Stress : Not on file  Social Connections:     . Frequency of Communication with Friends and Family: Not on file  . Frequency of Social Gatherings with Friends and Family: Not on file  . Attends Religious Services: Not on file  . Active Member of Clubs or Organizations: Not on file  . Attends Archivist Meetings: Not on file  . Marital Status: Not on file  Intimate Partner Violence:   . Fear of Current or Ex-Partner: Not on file  . Emotionally Abused: Not on file  . Physically Abused: Not on file  . Sexually Abused: Not on file    Current Outpatient Medications on File Prior to Visit  Medication Sig Dispense Refill  . Accu-Chek FastClix Lancets MISC Test BS QID Dx E10.8 102 each 11  . ACCU-CHEK GUIDE test strip USE AS DIRECTED 4 TIMES A DAY 100 strip 9  . atorvastatin (LIPITOR) 80 MG tablet Take 1 tablet (80 mg total) by mouth daily. 90 tablet 1  . blood glucose meter kit and supplies Dispense based on patient and insurance preference. Use up to four times daily as directed. (FOR ICD-10 E10.9, E11.9). 1 each 0  . Blood Glucose Monitoring Suppl (ACCU-CHEK GUIDE) w/Device KIT 1 kit by Other route 4 (four) times  daily as needed. Dx E11.9 1 kit 0  . fenofibrate (TRICOR) 145 MG tablet Take 1 tablet (145 mg total) by mouth daily. 90 tablet 1  . icosapent Ethyl (VASCEPA) 1 g capsule Take 2 capsules (2 g total) by mouth 2 (two) times daily. 360 capsule 1  . Insulin Pen Needle (PEN NEEDLES) 31G X 5 MM MISC 1 Device by Does not apply route Nightly. 100 each 6  . lisdexamfetamine (VYVANSE) 50 MG capsule Take 1 capsule (50 mg total) by mouth daily. 30 capsule 0  . lisinopril (ZESTRIL) 5 MG tablet Take 1 tablet (5 mg total) by mouth daily. 90 tablet 1  . metFORMIN (GLUCOPHAGE) 500 MG tablet Take 1 tablet (500 mg total) by mouth 2 (two) times daily with a meal. 180 tablet 3  . potassium chloride (KLOR-CON) 10 MEQ tablet Take 1 tablet (10 mEq total) by mouth 2 (two) times daily. 90 tablet 1   No current facility-administered medications  on file prior to visit.    No Known Allergies  Family History  Problem Relation Age of Onset  . Kidney disease Father   . Diabetes Father   . Pancreatitis Father     BP (!) 162/100   Pulse 92   Ht 5' 5"  (1.651 m)   Wt 242 lb (109.8 kg)   SpO2 97%   BMI 40.27 kg/m     Review of Systems denies weight loss, blurry vision, chest pain, sob, n/v, and depression.  He has urinary frequency.       Objective:   Physical Exam VITAL SIGNS:  See vs page GENERAL: no distress Pulses: dorsalis pedis intact bilat.   MSK: no deformity of the feet CV: no leg edema Skin:  no ulcer on the feet.  normal color and temp on the feet. Neuro: sensation is intact to touch on the feet.    Lab Results  Component Value Date   HGBA1C 7.2 (A) 09/24/2020   I have reviewed outside records, and summarized: Pt was noted to have elevated A1c, and referred here.  HTN and dyslipidemia were also addressed      Assessment & Plan:  Type 1 DM: He declines multiple daily injections Noncompliance with insulin.  Pt says he can remember better if he takes Lantus qam. HTN: is noted today  Patient Instructions  Your blood pressure is high today.  Please see your primary care provider soon, to have it rechecked good diet and exercise significantly improve the control of your diabetes.  please let me know if you wish to be referred to a dietician.  high blood sugar is very risky to your health.  you should see an eye doctor and dentist every year.  It is very important to get all recommended vaccinations.  Controlling your blood pressure and cholesterol drastically reduces the damage diabetes does to your body.  Those who smoke should quit.  Please discuss these with your doctor.  check your blood sugar 4 times a day.  vary the time of day when you check, between before the 3 meals, and at bedtime.  also check if you have symptoms of your blood sugar being too high or too low.  please keep a record of the readings  and bring it to your next appointment here (or you can bring the meter itself).  You can write it on any piece of paper.  please call us sooner if your blood sugar goes below 70, or if you have a lot of readings  over 200.   Please change the Lantus to 20 units each morning.   On this type of insulin schedule, you should eat meals on a regular schedule.  If a meal is missed or significantly delayed, your blood sugar could go low.   Please come back for a follow-up appointment in 3 months.

## 2020-10-02 ENCOUNTER — Encounter: Payer: Self-pay | Admitting: Nurse Practitioner

## 2020-10-02 ENCOUNTER — Ambulatory Visit: Payer: Medicaid Other | Admitting: Nurse Practitioner

## 2020-10-02 ENCOUNTER — Other Ambulatory Visit: Payer: Self-pay

## 2020-10-02 VITALS — BP 134/84 | HR 97 | Temp 97.7°F | Resp 20 | Ht 65.0 in | Wt 239.0 lb

## 2020-10-02 DIAGNOSIS — F900 Attention-deficit hyperactivity disorder, predominantly inattentive type: Secondary | ICD-10-CM

## 2020-10-02 DIAGNOSIS — Z23 Encounter for immunization: Secondary | ICD-10-CM | POA: Diagnosis not present

## 2020-10-02 DIAGNOSIS — E785 Hyperlipidemia, unspecified: Secondary | ICD-10-CM | POA: Diagnosis not present

## 2020-10-02 DIAGNOSIS — E1169 Type 2 diabetes mellitus with other specified complication: Secondary | ICD-10-CM

## 2020-10-02 DIAGNOSIS — E108 Type 1 diabetes mellitus with unspecified complications: Secondary | ICD-10-CM

## 2020-10-02 DIAGNOSIS — I10 Essential (primary) hypertension: Secondary | ICD-10-CM | POA: Diagnosis not present

## 2020-10-02 DIAGNOSIS — E876 Hypokalemia: Secondary | ICD-10-CM | POA: Diagnosis not present

## 2020-10-02 LAB — BAYER DCA HB A1C WAIVED: HB A1C (BAYER DCA - WAIVED): 8.1 % — ABNORMAL HIGH (ref <7.0)

## 2020-10-02 MED ORDER — FENOFIBRATE 145 MG PO TABS: 145.0000 mg | ORAL_TABLET | Freq: Every day | ORAL | 1 refills | Status: DC

## 2020-10-02 MED ORDER — ICOSAPENT ETHYL 1 G PO CAPS: 2.0000 g | ORAL_CAPSULE | Freq: Two times a day (BID) | ORAL | 1 refills | Status: DC

## 2020-10-02 MED ORDER — POTASSIUM CHLORIDE ER 10 MEQ PO TBCR: 10.0000 meq | EXTENDED_RELEASE_TABLET | Freq: Two times a day (BID) | ORAL | 1 refills | Status: DC

## 2020-10-02 MED ORDER — LISDEXAMFETAMINE DIMESYLATE 50 MG PO CAPS: 50.0000 mg | ORAL_CAPSULE | Freq: Every day | ORAL | 0 refills | Status: DC

## 2020-10-02 MED ORDER — LISINOPRIL 5 MG PO TABS: 5.0000 mg | ORAL_TABLET | Freq: Every day | ORAL | 1 refills | Status: DC

## 2020-10-02 MED ORDER — ATORVASTATIN CALCIUM 80 MG PO TABS: 80.0000 mg | ORAL_TABLET | Freq: Every day | ORAL | 1 refills | Status: DC

## 2020-10-02 MED ORDER — LANTUS SOLOSTAR 100 UNIT/ML ~~LOC~~ SOPN: 20.0000 [IU] | PEN_INJECTOR | SUBCUTANEOUS | 3 refills | Status: DC

## 2020-10-02 MED ORDER — METFORMIN HCL 500 MG PO TABS: 500.0000 mg | ORAL_TABLET | Freq: Two times a day (BID) | ORAL | 3 refills | Status: DC

## 2020-10-02 NOTE — Progress Notes (Deleted)
   Subjective:    Patient ID: Stephen Richardson, male    DOB: 1996-07-05, 24 y.o.   MRN: [id number]  Chief Complaint: ADHD   HPI Pt here for ADHD medication refill. No new issues or concerns. States the only change is he know takes insulin at night verses in the morning. Labs & flu shot today.    Review of Systems  Constitutional: Negative.   HENT: Negative.   Eyes: Negative.   Respiratory: Negative.   Cardiovascular: Negative.   Gastrointestinal: Negative.   Endocrine: Negative.   Genitourinary: Negative.   Musculoskeletal: Negative.   Skin: Negative.   Allergic/Immunologic: Negative.   Neurological: Negative.   Hematological: Negative.   Psychiatric/Behavioral: Negative.   All other systems reviewed and are negative.      Objective:   Physical Exam Vitals and nursing note reviewed.  Constitutional:      Appearance: Normal appearance.  HENT:     Head: Normocephalic and atraumatic.     Right Ear: External ear normal.     Left Ear: External ear normal.     Nose: Nose normal.     Mouth/Throat:     Pharynx: Oropharynx is clear.  Eyes:     Pupils: Pupils are equal, round, and reactive to light.  Cardiovascular:     Rate and Rhythm: Normal rate and regular rhythm.     Heart sounds: Normal heart sounds.  Pulmonary:     Breath sounds: Normal breath sounds.  Abdominal:     General: Abdomen is flat.     Palpations: Abdomen is soft.  Musculoskeletal:        General: Normal range of motion.     Cervical back: Normal range of motion.  Skin:    General: Skin is warm and dry.     Capillary Refill: Capillary refill takes less than 2 seconds.  Neurological:     General: No focal deficit present.     Mental Status: He is alert and oriented to person, place, and time. Mental status is at baseline.  Psychiatric:        Mood and Affect: Mood normal.        Behavior: Behavior normal.        Thought Content: Thought content normal.        Judgment: Judgment normal.    BP  134/84   Pulse 97   Temp 97.7 F (36.5 C) (Temporal)   Resp 20   Ht 5\' 5"  (1.651 m)   Wt 239 lb (108.4 kg)   SpO2 97%   BMI 39.77 kg/m      Assessment & Plan:

## 2020-10-02 NOTE — Progress Notes (Signed)
Subjective:    Patient ID: Stephen Richardson, male    DOB: [birth year], 24 y.o.   MRN: [id number]  Chief Complaint: ADHD    HPI:  1. Essential hypertension BP Readings from Last 3 Encounters:  10/02/20 134/84  09/24/20 (!) 162/100  07/24/20 130/86  No chest pain, SOB, or headaches. Follow a cardiologist.    2. Hyperlipidemia associated with type 2 diabetes mellitus ([place]) Lab Results  Component Value Date   CHOL 240 (H) 06/29/2020   HDL 30 (L) 06/29/2020   LDLCALC 97 06/29/2020   LDLDIRECT 18 09/30/2019   TRIG 667 (HH) 06/29/2020   CHOLHDL 8.0 (H) 06/29/2020  Does not watch diet. Has CBG checked regularly. Takes medication as prescribed.    4. Attention deficit hyperactivity disorder (ADHD), predominantly inattentive type Here for ADHD medication refill. No complaints or issues at this time. He is on vyvanse 25m daily and is doing well.    Outpatient Encounter Medications as of 10/02/2020  Medication Sig  . Accu-Chek FastClix Lancets MISC Test BS QID Dx E10.8  . ACCU-CHEK GUIDE test strip USE AS DIRECTED 4 TIMES A DAY  . atorvastatin (LIPITOR) 80 MG tablet Take 1 tablet (80 mg total) by mouth daily.  . blood glucose meter kit and supplies Dispense based on patient and insurance preference. Use up to four times daily as directed. (FOR ICD-10 E10.9, E11.9).  .Marland KitchenBlood Glucose Monitoring Suppl (ACCU-CHEK GUIDE) w/Device KIT 1 kit by Other route 4 (four) times daily as needed. Dx E11.9  . fenofibrate (TRICOR) 145 MG tablet Take 1 tablet (145 mg total) by mouth daily.  .Marland Kitchenicosapent Ethyl (VASCEPA) 1 g capsule Take 2 capsules (2 g total) by mouth 2 (two) times daily.  . insulin glargine (LANTUS SOLOSTAR) 100 UNIT/ML Solostar Pen Inject 20 Units into the skin every morning.  . Insulin Pen Needle (PEN NEEDLES) 31G X 5 MM MISC 1 Device by Does not apply route Nightly.  .Marland Kitchenlisinopril (ZESTRIL) 5 MG tablet Take 1 tablet (5 mg total) by mouth daily.  . metFORMIN (GLUCOPHAGE) 500 MG tablet  Take 1 tablet (500 mg total) by mouth 2 (two) times daily with a meal.  . potassium chloride (KLOR-CON) 10 MEQ tablet Take 1 tablet (10 mEq total) by mouth 2 (two) times daily.  .Marland Kitchenlisdexamfetamine (VYVANSE) 50 MG capsule Take 1 capsule (50 mg total) by mouth daily.   No facility-administered encounter medications on file as of 10/02/2020.    History reviewed. No pertinent surgical history.  Family History  Problem Relation Age of Onset  . Kidney disease Father   . Diabetes Father   . Pancreatitis Father     New complaints: No new complaints.   Social history:   Controlled substance contract: signed 06/30/20     Review of Systems  Constitutional: Negative.   HENT: Negative.   Eyes: Negative.   Respiratory: Negative.   Cardiovascular: Negative.   Gastrointestinal: Negative.   Endocrine: Negative.   Genitourinary: Negative.   Musculoskeletal: Negative.   Skin: Negative.   Allergic/Immunologic: Negative.   Neurological: Negative.   Hematological: Negative.   Psychiatric/Behavioral: Negative.   All other systems reviewed and are negative.      Objective:   Physical Exam Vitals and nursing note reviewed.  Constitutional:      Appearance: Normal appearance.  HENT:     Head: Normocephalic and atraumatic.     Right Ear: External ear normal.     Left Ear: External ear normal.  Mouth/Throat:     Mouth: Mucous membranes are dry.  Eyes:     Extraocular Movements: Extraocular movements intact.     Pupils: Pupils are equal, round, and reactive to light.  Cardiovascular:     Rate and Rhythm: Normal rate and regular rhythm.     Heart sounds: Normal heart sounds.  Pulmonary:     Breath sounds: Normal breath sounds.  Abdominal:     General: Abdomen is flat.  Musculoskeletal:        General: Normal range of motion.     Cervical back: Normal range of motion.  Skin:    General: Skin is warm and dry.     Capillary Refill: Capillary refill takes less than 2 seconds.    Neurological:     General: No focal deficit present.     Mental Status: He is alert and oriented to person, place, and time. Mental status is at baseline.  Psychiatric:        Mood and Affect: Mood normal.        Behavior: Behavior normal.        Thought Content: Thought content normal.        Judgment: Judgment normal.     BP 134/84   Pulse 97   Temp 97.7 F (36.5 C) (Temporal)   Resp 20   Ht 5' 5"  (1.651 m)   Wt 239 lb (108.4 kg)   SpO2 97%   BMI 39.77 kg/m      Assessment & Plan:  [name] comes in today with chief complaint of ADHD   Diagnosis and orders addressed:  1. Essential hypertension Take medication as prescribed and check blood pressure regularly. Avoid foods high in salt and eat a heart healthy diet.  - CBC with Differential/Platelet - CMP14+EGFR  2. Hyperlipidemia associated with type 2 diabetes mellitus ([place]) Avoid foods high in fat or fried. Take medication as prescribed.  - Lipid panel  3. Type 1 diabetes mellitus with complications ([facility]) Take medication as prescribed and check blood glucose levels regularly. Avoid foods high in carbs and sugar.  - Bayer DCA Hb A1c Waived  4. Attention deficit hyperactivity disorder (ADHD), predominantly inattentive type Take medication as prescribed.   Meds ordered this encounter  Medications  . lisdexamfetamine (VYVANSE) 50 MG capsule    Sig: Take 1 capsule (50 mg total) by mouth daily.    Dispense:  30 capsule    Refill:  0    Order Specific Question:   Supervising Provider    Answer:   Caryl Pina A [id number]  . lisinopril (ZESTRIL) 5 MG tablet    Sig: Take 1 tablet (5 mg total) by mouth daily.    Dispense:  90 tablet    Refill:  1    Order Specific Question:   Supervising Provider    Answer:   Caryl Pina A [id number]  . atorvastatin (LIPITOR) 80 MG tablet    Sig: Take 1 tablet (80 mg total) by mouth daily.    Dispense:  90 tablet    Refill:  1    Order Specific Question:    Supervising Provider    Answer:   Caryl Pina A [id number]  . fenofibrate (TRICOR) 145 MG tablet    Sig: Take 1 tablet (145 mg total) by mouth daily.    Dispense:  90 tablet    Refill:  1    Order Specific Question:   Supervising Provider    Answer:   Caryl Pina A [id number]  .  potassium chloride (KLOR-CON) 10 MEQ tablet    Sig: Take 1 tablet (10 mEq total) by mouth 2 (two) times daily.    Dispense:  90 tablet    Refill:  1    Order Specific Question:   Supervising Provider    Answer:   Caryl Pina A [id number]  . insulin glargine (LANTUS SOLOSTAR) 100 UNIT/ML Solostar Pen    Sig: Inject 20 Units into the skin every morning.    Dispense:  30 mL    Refill:  3    Order Specific Question:   Supervising Provider    Answer:   Caryl Pina A [id number]  . metFORMIN (GLUCOPHAGE) 500 MG tablet    Sig: Take 1 tablet (500 mg total) by mouth 2 (two) times daily with a meal.    Dispense:  180 tablet    Refill:  3    Order Specific Question:   Supervising Provider    Answer:   Caryl Pina A [id number]  . icosapent Ethyl (VASCEPA) 1 g capsule    Sig: Take 2 capsules (2 g total) by mouth 2 (two) times daily.    Dispense:  360 capsule    Refill:  1    PA approved 08/05/2020-08/05/2021    Order Specific Question:   Supervising Provider    Answer:   Caryl Pina A [[id number]]  . lisdexamfetamine (VYVANSE) 50 MG capsule    Sig: Take 1 capsule (50 mg total) by mouth daily.    Dispense:  30 capsule    Refill:  0    Order Specific Question:   Supervising Provider    Answer:   Caryl Pina A [id number]  . lisdexamfetamine (VYVANSE) 50 MG capsule    Sig: Take 1 capsule (50 mg total) by mouth daily.    Dispense:  30 capsule    Refill:  0    Order Specific Question:   Supervising Provider    Answer:   Caryl Pina A [[id number]]   Orders Placed This Encounter  Procedures  . Bayer DCA Hb A1c Waived  . CBC with Differential/Platelet  . CMP14+EGFR  . Lipid panel      Labs pending Health Maintenance reviewed Diet and exercise encouraged  Follow up plan: Follow up in 3 months.    Mary-Margaret Hassell Done, FNP

## 2020-10-03 LAB — CBC WITH DIFFERENTIAL/PLATELET
Basophils Absolute: 0.1 10*3/uL (ref 0.0–0.2)
Basos: 1 %
EOS (ABSOLUTE): 0.1 10*3/uL (ref 0.0–0.4)
Eos: 2 %
Hematocrit: 46.8 % (ref 37.5–51.0)
Hemoglobin: 16 g/dL (ref 13.0–17.7)
Immature Grans (Abs): 0 10*3/uL (ref 0.0–0.1)
Immature Granulocytes: 0 %
Lymphocytes Absolute: 2.7 10*3/uL (ref 0.7–3.1)
Lymphs: 42 %
MCH: 29.6 pg (ref 26.6–33.0)
MCHC: 34.2 g/dL (ref 31.5–35.7)
MCV: 87 fL (ref 79–97)
Monocytes Absolute: 0.4 10*3/uL (ref 0.1–0.9)
Monocytes: 6 %
Neutrophils Absolute: 3.2 10*3/uL (ref 1.4–7.0)
Neutrophils: 49 %
Platelets: 350 10*3/uL (ref 150–450)
RBC: 5.41 x10E6/uL (ref 4.14–5.80)
RDW: 13.1 % (ref 11.6–15.4)
WBC: 6.4 10*3/uL (ref 3.4–10.8)

## 2020-10-03 LAB — CMP14+EGFR
ALT: 13 IU/L (ref 0–44)
AST: 22 IU/L (ref 0–40)
Albumin/Globulin Ratio: 1.9 (ref 1.2–2.2)
Albumin: 4.6 g/dL (ref 4.1–5.2)
Alkaline Phosphatase: 95 IU/L (ref 44–121)
BUN/Creatinine Ratio: 25 — ABNORMAL HIGH (ref 9–20)
BUN: 12 mg/dL (ref 6–20)
Bilirubin Total: 0.4 mg/dL (ref 0.0–1.2)
CO2: 22 mmol/L (ref 20–29)
Calcium: 10 mg/dL (ref 8.7–10.2)
Chloride: 97 mmol/L (ref 96–106)
Creatinine, Ser: 0.48 mg/dL — ABNORMAL LOW (ref 0.76–1.27)
GFR calc Af Amer: 181 mL/min/{1.73_m2} (ref >59)
GFR calc non Af Amer: 156 mL/min/{1.73_m2} (ref >59)
Globulin, Total: 2.4 g/dL (ref 1.5–4.5)
Glucose: 269 mg/dL — ABNORMAL HIGH (ref 65–99)
Potassium: 4.6 mmol/L (ref 3.5–5.2)
Sodium: 135 mmol/L (ref 134–144)
Total Protein: 7 g/dL (ref 6.0–8.5)

## 2020-10-03 LAB — LIPID PANEL
Chol/HDL Ratio: 9.6 ratio — ABNORMAL HIGH (ref 0.0–5.0)
Cholesterol, Total: 249 mg/dL — ABNORMAL HIGH (ref 100–199)
HDL: 26 mg/dL — ABNORMAL LOW (ref >39)
Triglycerides: 1047 mg/dL (ref 0–149)

## 2020-10-23 NOTE — Progress Notes (Signed)
Cardiology Office Note:   Date:  10/26/2020  NAME:  Stephen Richardson    MRN: [id number] DOB:  12-30-1995   PCP:  Chevis Pretty, FNP  Cardiologist:  No primary care provider on file.   Referring MD: Chevis Pretty, *   Chief Complaint  Patient presents with  . Follow-up    3 months.   History of Present Illness:   Stephen Richardson is a 24 y.o. male with a hx of hypertension, hyperlipidemia, diabetes who presents for follow-up.  Most recent triglycerides are still significantly elevated.  A1c is worsened as well.  He has lost a bit of weight.  He is not exercising that much but does some things around the house such as plain basketball.  He is taking his Lipitor, fenofibrate and Vascepa.  Triglycerides around 2000.  He presents with his mother.  Unclear if he was fasting at the time of the lab draw.  Given that his A1c is worsened and triglycerides have worsened I suspect his diet has worsened as well.  He denies any chest pain or shortness of breath in the office today.  We did go over the fact that he needs to get serious about his diet.  He is very young.  I also recommend he get reestablished with an endocrinologist.  He does see one in [place].  I also recommended referral to the advanced lipid clinic here with Dr. Mali Hilty.   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 HTN 3. HLD -Total cholesterol total cholesterol 249, HDL 26, triglycerides 1047 4. T1 DM -A1c 8.1  Past Medical History: Past Medical History:  Diagnosis Date  . ADD (attention deficit disorder with hyperactivity)   . Chest pain on exertion   . Hyperlipidemia   . ODD (oppositional defiant disorder)     Past Surgical History: History reviewed. No pertinent surgical history.  Current Medications: Current Meds  Medication Sig  . Accu-Chek FastClix Lancets MISC Test BS QID Dx E10.8  . ACCU-CHEK GUIDE test strip USE AS DIRECTED 4 TIMES A  DAY  . atorvastatin (LIPITOR) 80 MG tablet Take 1 tablet (80 mg total) by mouth daily.  . blood glucose meter kit and supplies Dispense based on patient and insurance preference. Use up to four times daily as directed. (FOR ICD-10 E10.9, E11.9).  Marland Kitchen Blood Glucose Monitoring Suppl (ACCU-CHEK GUIDE) w/Device KIT 1 kit by Other route 4 (four) times daily as needed. Dx E11.9  . fenofibrate (TRICOR) 145 MG tablet Take 1 tablet (145 mg total) by mouth daily.  Marland Kitchen icosapent Ethyl (VASCEPA) 1 g capsule Take 2 capsules (2 g total) by mouth 2 (two) times daily.  . insulin glargine (LANTUS SOLOSTAR) 100 UNIT/ML Solostar Pen Inject 20 Units into the skin every morning.  . Insulin Pen Needle (PEN NEEDLES) 31G X 5 MM MISC 1 Device by Does not apply route Nightly.  . lisdexamfetamine (VYVANSE) 50 MG capsule Take 1 capsule (50 mg total) by mouth daily.  Marland Kitchen lisinopril (ZESTRIL) 5 MG tablet Take 1 tablet (5 mg total) by mouth daily.  . metFORMIN (GLUCOPHAGE) 500 MG tablet Take 1 tablet (500 mg total) by mouth 2 (two) times daily with a meal.  . potassium chloride (KLOR-CON) 10 MEQ tablet Take 1 tablet (10 mEq total) by mouth 2 (two) times daily.  . [DISCONTINUED] lisdexamfetamine (VYVANSE) 50 MG capsule Take 1 capsule (50 mg total) by mouth daily.  . [DISCONTINUED] lisdexamfetamine (VYVANSE)  50 MG capsule Take 1 capsule (50 mg total) by mouth daily.     Allergies:    Patient has no known allergies.   Social History: Social History   Socioeconomic History  . Marital status: Single    Spouse name: Not on file  . Number of children: Not on file  . Years of education: Not on file  . Highest education level: Not on file  Occupational History  . Not on file  Tobacco Use  . Smoking status: Never Smoker  . Smokeless tobacco: Never Used  Substance and Sexual Activity  . Alcohol use: No  . Drug use: No  . Sexual activity: Never  Other Topics Concern  . Not on file  Social History Narrative  . Not on file    Social Determinants of Health   Financial Resource Strain:   . Difficulty of Paying Living Expenses: Not on file  Food Insecurity:   . Worried About Charity fundraiser in the Last Year: Not on file  . Ran Out of Food in the Last Year: Not on file  Transportation Needs:   . Lack of Transportation (Medical): Not on file  . Lack of Transportation (Non-Medical): Not on file  Physical Activity:   . Days of Exercise per Week: Not on file  . Minutes of Exercise per Session: Not on file  Stress:   . Feeling of Stress : Not on file  Social Connections:   . Frequency of Communication with Friends and Family: Not on file  . Frequency of Social Gatherings with Friends and Family: Not on file  . Attends Religious Services: Not on file  . Active Member of Clubs or Organizations: Not on file  . Attends Archivist Meetings: Not on file  . Marital Status: Not on file     Family History: The patient's family history includes Diabetes in his father; Kidney disease in his father; Pancreatitis in his father.  ROS:   All other ROS reviewed and negative. Pertinent positives noted in the HPI.     EKGs/Labs/Other Studies Reviewed:   The following studies were personally reviewed by me today:  Recent Labs: 10/02/2020: ALT 13; BUN 12; Creatinine, Ser 0.48; Hemoglobin 16.0; Platelets 350; Potassium 4.6; Sodium 135   Recent Lipid Panel    Component Value Date/Time   CHOL 249 (H) 10/02/2020 1435   TRIG 1,047 (HH) 10/02/2020 1435   HDL 26 (L) 10/02/2020 1435   CHOLHDL 9.6 (H) 10/02/2020 1435   LDLCALC Comment (A) 10/02/2020 1435   LDLDIRECT 18 09/30/2019 1109    Physical Exam:   VS:  BP 114/68 (BP Location: Left Arm, Patient Position: Sitting, Cuff Size: Normal)   Pulse 80   Ht 5' 5"  (1.651 m)   Wt 233 lb (105.7 kg)   BMI 38.77 kg/m    Wt Readings from Last 3 Encounters:  10/26/20 233 lb (105.7 kg)  10/02/20 239 lb (108.4 kg)  09/24/20 242 lb (109.8 kg)    General: Well  nourished, well developed, in no acute distress Heart: Atraumatic, normal size  Eyes: PEERLA, EOMI  Neck: Supple, no JVD Endocrine: No thryomegaly Cardiac: Normal S1, S2; RRR; no murmurs, rubs, or gallops Lungs: Clear to auscultation bilaterally, no wheezing, rhonchi or rales  Abd: Soft, nontender, no hepatomegaly  Ext: No edema, pulses 2+ Musculoskeletal: No deformities, BUE and BLE strength normal and equal Skin: Warm and dry, no rashes   Neuro: Alert and oriented to person, place, time, and situation,  CNII-XII grossly intact, no focal deficits  Psych: Normal mood and affect   ASSESSMENT:   Stephen Richardson is a 24 y.o. male who presents for the following: 1. Hypertriglyceridemia   2. Primary hypertension   3. Mixed hyperlipidemia   4. Obesity (BMI 30-39.9)     PLAN:   1. Hypertriglyceridemia 2. Primary hypertension 3. Mixed hyperlipidemia 4. Obesity (BMI 30-39.9) -Significantly elevated triglycerides despite being on Lipitor, Vascepa and fenofibrate.  Unclear if he was fasting at his last visit with his primary care physician.  We will recheck his lipids today with a direct LDL.  We did go over extensively that his diet has to improve.  He can eat no fried foods.  He does not drink alcohol.  He also needs to lose weight.  His diabetes is worsened as well which will clearly increase his triglycerides.  I also would like for him to see Dr. Mali Hilty in the advanced lipid clinic for any possible clinical trial or new medications I am unaware of.  They are in agreement with this.  We will recheck his labs today and refer him to Dr. Debara Pickett.  Disposition: Return in about 1 year (around 10/26/2021).  Medication Adjustments/Labs and Tests Ordered: Current medicines are reviewed at length with the patient today.  Concerns regarding medicines are outlined above.  Orders Placed This Encounter  Procedures  . Lipid panel  . Direct LDL  . AMB Referral to Advanced Lipid Disorders Clinic   No  orders of the defined types were placed in this encounter.   Patient Instructions  Medication Instructions:  NO CHANGES *If you need a refill on your cardiac medications before your next appointment, please call your pharmacy*   Lab Work: TODAY LIPID AND DIRECT LDL If you have labs (blood work) drawn today and your tests are completely normal, you will receive your results only by: Marland Kitchen MyChart Message (if you have MyChart) OR . A paper copy in the mail If you have any lab test that is abnormal or we need to change your treatment, we will call you to review the results.   Testing/Procedures: NONE   Follow-Up: At [facility], you and your health needs are our priority.  As part of our continuing mission to provide you with exceptional heart care, we have created designated Provider Care Teams.  These Care Teams include your primary Cardiologist (physician) and Advanced Practice Providers (APPs -  Physician Assistants and Nurse Practitioners) who all work together to provide you with the care you need, when you need it.  We recommend signing up for the patient portal called "MyChart".  Sign up information is provided on this After Visit Summary.  MyChart is used to connect with patients for Virtual Visits (Telemedicine).  Patients are able to view lab/test results, encounter notes, upcoming appointments, etc.  Non-urgent messages can be sent to your provider as well.   To learn more about what you can do with MyChart, go to [email or web address].    Your next appointment:   1 year(s)  The format for your next appointment:   In Person  Provider:   Eleonore Chiquito, MD   Other Instructions You have been referred to  [place]     Time Spent with Patient: I have spent a total of 25 minutes with patient reviewing hospital notes, telemetry, EKGs, labs and examining the patient as well as establishing an assessment and plan that was discussed with the patient.  > 50% of  time was spent in direct patient care.  Signed, Addison Naegeli. Audie Box, [place]  [address], [place], [place] [address] [phone]  10/26/2020 3:57 PM

## 2020-10-26 ENCOUNTER — Ambulatory Visit (INDEPENDENT_AMBULATORY_CARE_PROVIDER_SITE_OTHER): Payer: Medicaid Other | Admitting: Cardiovascular Disease

## 2020-10-26 ENCOUNTER — Encounter: Payer: Self-pay | Admitting: Cardiovascular Disease

## 2020-10-26 VITALS — BP 114/68 | HR 80 | Ht 65.0 in | Wt 233.0 lb

## 2020-10-26 DIAGNOSIS — E669 Obesity, unspecified: Secondary | ICD-10-CM

## 2020-10-26 DIAGNOSIS — I10 Essential (primary) hypertension: Secondary | ICD-10-CM | POA: Diagnosis not present

## 2020-10-26 DIAGNOSIS — E781 Pure hyperglyceridemia: Secondary | ICD-10-CM

## 2020-10-26 DIAGNOSIS — E782 Mixed hyperlipidemia: Secondary | ICD-10-CM

## 2020-10-26 NOTE — Patient Instructions (Signed)
Medication Instructions:  NO CHANGES *If you need a refill on your cardiac medications before your next appointment, please call your pharmacy*   Lab Work: TODAY LIPID AND DIRECT LDL If you have labs (blood work) drawn today and your tests are completely normal, you will receive your results only by: Marland Kitchen MyChart Message (if you have MyChart) OR . A paper copy in the mail If you have any lab test that is abnormal or we need to change your treatment, we will call you to review the results.   Testing/Procedures: NONE   Follow-Up: At [facility], you and your health needs are our priority.  As part of our continuing mission to provide you with exceptional heart care, we have created designated Provider Care Teams.  These Care Teams include your primary Cardiologist (physician) and Advanced Practice Providers (APPs -  Physician Assistants and Nurse Practitioners) who all work together to provide you with the care you need, when you need it.  We recommend signing up for the patient portal called "MyChart".  Sign up information is provided on this After Visit Summary.  MyChart is used to connect with patients for Virtual Visits (Telemedicine).  Patients are able to view lab/test results, encounter notes, upcoming appointments, etc.  Non-urgent messages can be sent to your provider as well.   To learn more about what you can do with MyChart, go to [email or web address].    Your next appointment:   1 year(s)  The format for your next appointment:   In Person  Provider:   Lennie Odor, MD   Other Instructions You have been referred to  DR HIL[facility]  LIPID CLINIC

## 2020-10-27 LAB — LIPID PANEL
Chol/HDL Ratio: 38.7 ratio — ABNORMAL HIGH (ref 0.0–5.0)
Cholesterol, Total: 542 mg/dL — ABNORMAL HIGH (ref 100–199)
HDL: 14 mg/dL — ABNORMAL LOW (ref >39)
Triglycerides: 2784 mg/dL (ref 0–149)

## 2020-10-27 LAB — LDL CHOLESTEROL, DIRECT: LDL Direct: 49 mg/dL (ref 0–99)

## 2020-11-02 NOTE — Telephone Encounter (Signed)
Patient called to offer sooner lipid clinic visit - scheduled 11/18/2020 @ 11:45am

## 2020-11-18 ENCOUNTER — Ambulatory Visit (INDEPENDENT_AMBULATORY_CARE_PROVIDER_SITE_OTHER): Payer: Medicaid Other | Admitting: Internal Medicine

## 2020-11-18 ENCOUNTER — Encounter: Payer: Self-pay | Admitting: Internal Medicine

## 2020-11-18 ENCOUNTER — Other Ambulatory Visit: Payer: Self-pay

## 2020-11-18 VITALS — BP 122/73 | HR 88 | Ht 65.0 in | Wt 224.4 lb

## 2020-11-18 DIAGNOSIS — E669 Obesity, unspecified: Secondary | ICD-10-CM

## 2020-11-18 DIAGNOSIS — I10 Essential (primary) hypertension: Secondary | ICD-10-CM | POA: Diagnosis not present

## 2020-11-18 DIAGNOSIS — E783 Hyperchylomicronemia: Secondary | ICD-10-CM | POA: Diagnosis not present

## 2020-11-18 NOTE — Progress Notes (Signed)
  LIPID CLINIC CONSULT NOTE  Chief Complaint:  High triglycerides  Primary Care Physician: Martin, Mary-Margaret, FNP  Primary Cardiologist:  No primary care provider on file.  HPI:  Stephen Richardson is a 24 y.o. male who is being seen today for the evaluation of high triglycerides at the request of [place] O'Neal, MD. This is a pleasant 24-year-old male kindly referred by Dr. O. Neal for evaluation and management of hypertriglyceridemia.  His other medical problems include type 2 diabetes with recently elevated hemoglobin A1c at 8.1, hypertension and moderate obesity.  There is a strong family history of high triglycerides particularly in his father and his aunt, but his father also was a more heavy alcohol user.  [name] says that he does not use alcohol.  More recently has been fairly sedentary but started exercising a little more and has lost about 9 pounds.  He is working with an endocrinologist on his blood sugars.  He denies any history of pancreatitis however that is in the family.  He has not been on any steroids or other associated medications recently.  He is not thought to have any coronary disease.  He has been treated on atorvastatin, Vascepa and fenofibrate but his triglycerides remain significantly elevated.  Total cholesterol was 249, triglycerides 1047, HDL 26 and LDL cannot be calculated.  Subsequent to that about a month later his triglycerides were even higher at 2784.  Total cholesterol of 542.  These findings are significant and suggestive of hyper chylomicronemia.  I wonder if this could be a genetic familial hyper chylomicronemia syndrome or more likely this represents a severe combined hypertriglyceridemia.  PMHx:  Past Medical History:  Diagnosis Date  . ADD (attention deficit disorder with hyperactivity)   . Chest pain on exertion   . Hyperlipidemia   . ODD (oppositional defiant disorder)     No past surgical history on file.  FAMHx:  Family History  Problem  Relation Age of Onset  . Kidney disease Father   . Diabetes Father   . Pancreatitis Father     SOCHx:   reports that he has never smoked. He has never used smokeless tobacco. He reports that he does not drink alcohol and does not use drugs.  ALLERGIES:  No Known Allergies  ROS: Pertinent items noted in HPI and remainder of comprehensive ROS otherwise negative.  HOME MEDS: Current Outpatient Medications on File Prior to Visit  Medication Sig Dispense Refill  . Accu-Chek FastClix Lancets MISC Test BS QID Dx E10.8 102 each 11  . ACCU-CHEK GUIDE test strip USE AS DIRECTED 4 TIMES A DAY 100 strip 9  . atorvastatin (LIPITOR) 80 MG tablet Take 1 tablet (80 mg total) by mouth daily. 90 tablet 1  . blood glucose meter kit and supplies Dispense based on patient and insurance preference. Use up to four times daily as directed. (FOR ICD-10 E10.9, E11.9). 1 each 0  . Blood Glucose Monitoring Suppl (ACCU-CHEK GUIDE) w/Device KIT 1 kit by Other route 4 (four) times daily as needed. Dx E11.9 1 kit 0  . fenofibrate (TRICOR) 145 MG tablet Take 1 tablet (145 mg total) by mouth daily. 90 tablet 1  . icosapent Ethyl (VASCEPA) 1 g capsule Take 2 capsules (2 g total) by mouth 2 (two) times daily. 360 capsule 1  . insulin glargine (LANTUS SOLOSTAR) 100 UNIT/ML Solostar Pen Inject 20 Units into the skin every morning. 30 mL 3  . Insulin Pen Needle (PEN NEEDLES) 31G X 5 MM MISC   1 Device by Does not apply route Nightly. 100 each 6  . lisdexamfetamine (VYVANSE) 50 MG capsule Take 1 capsule (50 mg total) by mouth daily. 30 capsule 0  . lisinopril (ZESTRIL) 5 MG tablet Take 1 tablet (5 mg total) by mouth daily. 90 tablet 1  . metFORMIN (GLUCOPHAGE) 500 MG tablet Take 1 tablet (500 mg total) by mouth 2 (two) times daily with a meal. 180 tablet 3  . potassium chloride (KLOR-CON) 10 MEQ tablet Take 1 tablet (10 mEq total) by mouth 2 (two) times daily. 90 tablet 1   No current facility-administered medications on file  prior to visit.    LABS/IMAGING: No results found for this or any previous visit (from the past 48 hour(s)). No results found.  LIPID PANEL:    Component Value Date/Time   CHOL 542 (H) 10/26/2020 1040   TRIG 2,784 (HH) 10/26/2020 1040   HDL 14 (L) 10/26/2020 1040   CHOLHDL 38.7 (H) 10/26/2020 1040   LDLCALC Comment (A) 10/26/2020 1040   LDLDIRECT 49 10/26/2020 1040    WEIGHTS: Wt Readings from Last 3 Encounters:  11/18/20 224 lb 6.4 oz (101.8 kg)  10/26/20 233 lb (105.7 kg)  10/02/20 239 lb (108.4 kg)    VITALS: BP 122/73   Pulse 88   Ht 5' 5" (1.651 m)   Wt 224 lb 6.4 oz (101.8 kg)   SpO2 98%   BMI 37.34 kg/m   EXAM: General appearance: alert, no distress and moderately obese Neck: no carotid bruit, no JVD and thyroid not enlarged, symmetric, no tenderness/mass/nodules Lungs: clear to auscultation bilaterally Heart: regular rate and rhythm Abdomen: soft, non-tender; bowel sounds normal; no masses,  no organomegaly and Obese Extremities: extremities normal, atraumatic, no cyanosis or edema Pulses: 2+ and symmetric Skin: Skin color, texture, turgor normal. No rashes or lesions Neurologic: Mental status: Alert, oriented, thought content appropriate Psych: Pleasant  ASSESSMENT: 1. 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 Type 2 diabetes-A1c 8.1 3. Moderate obesity 4. Sedentary lifestyle 5. Hypertension  PLAN: 1.   Mr. Macke has likely hyper chylomicronemia however it is less likely this is a familial chylomicronemia syndrome.  The incidence of this is 1 in over 1 million.  To date we have not identified any in our area after screening patients for a specific APO C3 inhibitor that we are clinically trialing for this disorder.  There has however been approval for a follow on study to address patients with high triglycerides over 800 and he may be a candidate for this.  I would like to pass on his name and information.  This molecule which is very similar to  volanosorsen, which is FDA approved in [place] but not here due to thrombocytopenia, seems well-tolerated and has not been associated with low platelets and preclinical studies and seems to lower triglycerides by about 80%.  I have also provided dietary information for a very strict low saturated fat diet with less than 15% calories per fat and would recommend more increases in physical activity and improve glycemic control which is endocrinologist already is working on.  Thanks for this interesting referral.  We will keep you updated as to whether or not he is enrolling in our clinical trials.  Pixie Casino, MD, [facility], [place] Director of the Advanced Lipid Disorders &  Cardiovascular Risk Reduction Clinic Diplomate of the American Board of Clinical Lipidology Attending Cardiologist  Direct Dial: [phone]  Fax: [phone]  Website:  [email or web address]  Chrissie Noa  C Hilty 11/18/2020, 12:44 PM 

## 2020-11-18 NOTE — Patient Instructions (Signed)
Medication Instructions:  Your physician recommends that you continue on your current medications as directed. Please refer to the Current Medication list given to you today.  *If you need a refill on your cardiac medications before your next appointment, please call your pharmacy*   Follow-Up: At [facility], you and your health needs are our priority.  As part of our continuing mission to provide you with exceptional heart care, we have created designated Provider Care Teams.  These Care Teams include your primary Cardiologist (physician) and Advanced Practice Providers (APPs -  Physician Assistants and Nurse Practitioners) who all work together to provide you with the care you need, when you need it.  We recommend signing up for the patient portal called "MyChart".  Sign up information is provided on this After Visit Summary.  MyChart is used to connect with patients for Virtual Visits (Telemedicine).  Patients are able to view lab/test results, encounter notes, upcoming appointments, etc.  Non-urgent messages can be sent to your provider as well.   To learn more about what you can do with MyChart, go to [email or web address].    Your next appointment:   6 months with Dr. Rennis Golden - lipid clinic  Other Instructions

## 2020-12-14 ENCOUNTER — Telehealth: Payer: Self-pay

## 2020-12-14 DIAGNOSIS — Z006 Encounter for examination for normal comparison and control in clinical research program: Secondary | ICD-10-CM

## 2020-12-14 NOTE — Telephone Encounter (Signed)
Called subject to schedule screening for BALANCE trial. Left voicemail asking for return call.

## 2020-12-24 ENCOUNTER — Other Ambulatory Visit: Payer: Self-pay | Admitting: Family

## 2020-12-29 ENCOUNTER — Telehealth: Payer: Self-pay

## 2020-12-29 NOTE — Telephone Encounter (Signed)
Patient called to cancel appointment for screening visit for BALANCE trial. Offered to reschedule appointment and patient declined. Will follow-up next week to attempt to reschedule.

## 2020-12-30 ENCOUNTER — Ambulatory Visit: Payer: Medicaid Other | Admitting: Endocrinology

## 2021-01-04 ENCOUNTER — Encounter: Payer: Self-pay | Admitting: Nurse Practitioner

## 2021-01-04 ENCOUNTER — Other Ambulatory Visit: Payer: Self-pay

## 2021-01-04 ENCOUNTER — Ambulatory Visit: Payer: Medicaid Other | Admitting: Nurse Practitioner

## 2021-01-04 VITALS — BP 126/78 | HR 92 | Temp 98.2°F | Resp 20 | Ht 65.0 in | Wt 206.0 lb

## 2021-01-04 DIAGNOSIS — E108 Type 1 diabetes mellitus with unspecified complications: Secondary | ICD-10-CM | POA: Diagnosis not present

## 2021-01-04 DIAGNOSIS — F900 Attention-deficit hyperactivity disorder, predominantly inattentive type: Secondary | ICD-10-CM

## 2021-01-04 DIAGNOSIS — E876 Hypokalemia: Secondary | ICD-10-CM

## 2021-01-04 DIAGNOSIS — I10 Essential (primary) hypertension: Secondary | ICD-10-CM | POA: Diagnosis not present

## 2021-01-04 DIAGNOSIS — Q231 Congenital insufficiency of aortic valve: Secondary | ICD-10-CM

## 2021-01-04 DIAGNOSIS — Z6834 Body mass index (BMI) 34.0-34.9, adult: Secondary | ICD-10-CM | POA: Diagnosis not present

## 2021-01-04 DIAGNOSIS — R6250 Unspecified lack of expected normal physiological development in childhood: Secondary | ICD-10-CM | POA: Diagnosis not present

## 2021-01-04 DIAGNOSIS — E1169 Type 2 diabetes mellitus with other specified complication: Secondary | ICD-10-CM

## 2021-01-04 DIAGNOSIS — E785 Hyperlipidemia, unspecified: Secondary | ICD-10-CM

## 2021-01-04 LAB — BAYER DCA HB A1C WAIVED: HB A1C (BAYER DCA - WAIVED): 11.8 % — ABNORMAL HIGH (ref <7.0)

## 2021-01-04 MED ORDER — LANTUS SOLOSTAR 100 UNIT/ML ~~LOC~~ SOPN: 30.0000 [IU] | PEN_INJECTOR | SUBCUTANEOUS | 3 refills | Status: DC

## 2021-01-04 MED ORDER — LISDEXAMFETAMINE DIMESYLATE 50 MG PO CAPS: 50.0000 mg | ORAL_CAPSULE | Freq: Every day | ORAL | 0 refills | Status: DC

## 2021-01-04 NOTE — Progress Notes (Addendum)
Subjective:    Patient ID: Stephen Richardson, male    DOB: 1996-02-25, 25 y.o.   MRN: [id number]   Chief Complaint: Medical Management of Chronic Issues    HPI:  1. Essential hypertension No c/o chest pain, sob or headache. Dos not check blood pressure at home. BP Readings from Last 3 Encounters:  11/18/20 122/73  10/26/20 114/68  10/02/20 134/84     2. Hyperlipidemia associated with type 2 diabetes mellitus ([place]) Does not watch diet and does very little to no exercise.' Lab Results  Component Value Date   CHOL 542 (H) 10/26/2020   HDL 14 (L) 10/26/2020   LDLCALC Comment (A) 10/26/2020   LDLDIRECT 49 10/26/2020   TRIG 2,784 ([place]) 10/26/2020   CHOLHDL 38.7 (H) 10/26/2020     3. Type 1 diabetes mellitus with complications (HCC) Fasting blood sugars have been running below 140 consistently. He tries to avoid sweets. We made no changes to meds at last visit. Lab Results  Component Value Date   HGBA1C 8.1 (H) 10/02/2020      4. Hypokalemia No c/o lower ext cramping. Lab Results  Component Value Date   K 4.6 10/02/2020     5. Bicuspid aortic valve Had aortic valve replacement. Sees cardiology yearly. Last visit was 11/18/20. Reviewing office note shows no change to plan of care at this time.  6. Attention deficit hyperactivity disorder (ADHD), predominantly inattentive type He is currently on vyvnase 75m he takes it most days. Is very hyper if does not take medication. Denies medication side efects.  7. Developmental delay He aged out of going to the local high school. Doe snot get much interacton with anyone other then his mom. He is doing ok. Due to developmental delay he is not able to beon his own.  8. BMI 37.0-37.9, adult Weight is down 18lbs from last visit Wt Readings from Last 3 Encounters:  01/04/21 206 lb (93.4 kg)  11/18/20 224 lb 6.4 oz (101.8 kg)  10/26/20 233 lb (105.7 kg)   BMI Readings from Last 3 Encounters:  01/04/21 34.28 kg/m  11/18/20  37.34 kg/m  10/26/20 38.77 kg/m      Outpatient Encounter Medications as of 01/04/2021  Medication Sig  . Accu-Chek FastClix Lancets MISC Test BS QID Dx E10.8  . ACCU-CHEK GUIDE test strip USE AS DIRECTED 4 TIMES A DAY  . atorvastatin (LIPITOR) 80 MG tablet Take 1 tablet (80 mg total) by mouth daily.  . blood glucose meter kit and supplies Dispense based on patient and insurance preference. Use up to four times daily as directed. (FOR ICD-10 E10.9, E11.9).  .Marland KitchenBlood Glucose Monitoring Suppl (ACCU-CHEK GUIDE) w/Device KIT 1 kit by Other route 4 (four) times daily as needed. Dx E11.9  . fenofibrate (TRICOR) 145 MG tablet Take 1 tablet (145 mg total) by mouth daily.  .Marland Kitchenicosapent Ethyl (VASCEPA) 1 g capsule Take 2 capsules (2 g total) by mouth 2 (two) times daily.  . insulin glargine (LANTUS SOLOSTAR) 100 UNIT/ML Solostar Pen Inject 20 Units into the skin every morning.  . Insulin Pen Needle (B-D UF III MINI PEN NEEDLES) 31G X 5 MM MISC Use with insulin daily Dx E10.8  . lisdexamfetamine (VYVANSE) 50 MG capsule Take 1 capsule (50 mg total) by mouth daily.  .Marland Kitchenlisinopril (ZESTRIL) 5 MG tablet Take 1 tablet (5 mg total) by mouth daily.  . metFORMIN (GLUCOPHAGE) 500 MG tablet Take 1 tablet (500 mg total) by mouth 2 (two) times daily with a  meal.  . potassium chloride (KLOR-CON) 10 MEQ tablet Take 1 tablet (10 mEq total) by mouth 2 (two) times daily.   History reviewed. No pertinent surgical history.  Family History  Problem Relation Age of Onset  . Kidney disease Father   . Diabetes Father   . Pancreatitis Father     New complaints: None today  Social history: Lives with his mom  Controlled substance contract: n/a    Review of Systems  Constitutional: Negative for diaphoresis.  Eyes: Negative for pain.  Respiratory: Negative for shortness of breath.   Cardiovascular: Negative for chest pain, palpitations and leg swelling.  Gastrointestinal: Negative for abdominal pain.   Endocrine: Negative for polydipsia.  Skin: Negative for rash.  Neurological: Negative for dizziness, weakness and headaches.  Hematological: Does not bruise/bleed easily.  All other systems reviewed and are negative.      Objective:   Physical Exam Vitals and nursing note reviewed.  Constitutional:      Appearance: Normal appearance. He is well-developed and well-nourished.  HENT:     Head: Normocephalic.     Nose: Nose normal.     Mouth/Throat:     Mouth: Oropharynx is clear and moist.  Eyes:     Extraocular Movements: EOM normal.     Pupils: Pupils are equal, round, and reactive to light.  Neck:     Thyroid: No thyroid mass or thyromegaly.     Vascular: No carotid bruit or JVD.     Trachea: Phonation normal.  Cardiovascular:     Rate and Rhythm: Normal rate and regular rhythm.     Comments: Audible heart valve click Pulmonary:     Effort: Pulmonary effort is normal. No respiratory distress.     Breath sounds: Normal breath sounds.  Abdominal:     General: Bowel sounds are normal. Aorta is normal.     Palpations: Abdomen is soft.     Tenderness: There is no abdominal tenderness.  Musculoskeletal:        General: Normal range of motion.     Cervical back: Normal range of motion and neck supple.  Lymphadenopathy:     Cervical: No cervical adenopathy.  Skin:    General: Skin is warm and dry.  Neurological:     Mental Status: He is alert and oriented to person, place, and time.  Psychiatric:        Mood and Affect: Mood and affect normal.        Behavior: Behavior normal.        Thought Content: Thought content normal.        Judgment: Judgment normal.    BP 126/78   Pulse 92   Temp 98.2 F (36.8 C) (Temporal)   Resp 20   Ht 5' 5"  (1.651 m)   Wt 206 lb (93.4 kg)   SpO2 96%   BMI 34.28 kg/m   hgba1c 11.8%     Assessment & Plan:  Stephen Richardson comes in today with chief complaint of Medical Management of Chronic Issues   Diagnosis and orders  addressed:  1. Essential hypertension Low sodium diet - CBC with Differential/Platelet - CMP14+EGFR  2. Hyperlipidemia associated with type 2 diabetes mellitus (HCC) Low fat diet - Lipid panel - LDL Cholesterol, Direct  3. Type 1 diabetes mellitus with complications (HCC) Continue to watch carbs in diet Increase lantus to 25u for 1 week then to 30u qhs Follow up in 1 month - Bayer DCA Hb A1c Waived - Microalbumin / creatinine  urine ratio  4. Hypokalemia Labs pending  5. Bicuspid aortic valve Keep follow up with cardiology  6. Attention deficit hyperactivity disorder (ADHD), predominantly inattentive type - lisdexamfetamine (VYVANSE) 50 MG capsule; Take 1 capsule (50 mg total) by mouth daily.  Dispense: 30 capsule; Refill: 0 - lisdexamfetamine (VYVANSE) 50 MG capsule; Take 1 capsule (50 mg total) by mouth daily.  Dispense: 30 capsule; Refill: 0 - lisdexamfetamine (VYVANSE) 50 MG capsule; Take 1 capsule (50 mg total) by mouth daily.  Dispense: 30 capsule; Refill: 0  7. Developmental delay  8. BMI 34.0-34.9,adult Discussed diet and exercise for person with BMI >25 Will recheck weight in 3-6 months    Labs pending Health Maintenance reviewed Diet and exercise encouraged  Follow up plan: 3 months   Mary-Margaret Hassell Done, FNP

## 2021-01-04 NOTE — Addendum Note (Signed)
Addended by: Bennie Pierini on: 01/04/2021 02:44 PM   Modules accepted: Orders

## 2021-01-04 NOTE — Patient Instructions (Signed)
Diabetes Mellitus and Foot Care Foot care is an important part of your health, especially when you have diabetes. Diabetes may cause you to have problems because of poor blood flow (circulation) to your feet and legs, which can cause your skin to:  Become thinner and drier.  Break more easily.  Heal more slowly.  Peel and crack. You may also have nerve damage (neuropathy) in your legs and feet, causing decreased feeling in them. This means that you may not notice minor injuries to your feet that could lead to more serious problems. Noticing and addressing any potential problems early is the best way to prevent future foot problems. How to care for your feet Foot hygiene  Wash your feet daily with warm water and mild soap. Do not use hot water. Then, pat your feet and the areas between your toes until they are completely dry. Do not soak your feet as this can dry your skin.  Trim your toenails straight across. Do not dig under them or around the cuticle. File the edges of your nails with an emery board or nail file.  Apply a moisturizing lotion or petroleum jelly to the skin on your feet and to dry, brittle toenails. Use lotion that does not contain alcohol and is unscented. Do not apply lotion between your toes.   Shoes and socks  Wear clean socks or stockings every day. Make sure they are not too tight. Do not wear knee-high stockings since they may decrease blood flow to your legs.  Wear shoes that fit properly and have enough cushioning. Always look in your shoes before you put them on to be sure there are no objects inside.  To break in new shoes, wear them for just a few hours a day. This prevents injuries on your feet. Wounds, scrapes, corns, and calluses  Check your feet daily for blisters, cuts, bruises, sores, and redness. If you cannot see the bottom of your feet, use a mirror or ask someone for help.  Do not cut corns or calluses or try to remove them with medicine.  If you  find a minor scrape, cut, or break in the skin on your feet, keep it and the skin around it clean and dry. You may clean these areas with mild soap and water. Do not clean the area with peroxide, alcohol, or iodine.  If you have a wound, scrape, corn, or callus on your foot, look at it several times a day to make sure it is healing and not infected. Check for: ? Redness, swelling, or pain. ? Fluid or blood. ? Warmth. ? Pus or a bad smell.   General tips  Do not cross your legs. This may decrease blood flow to your feet.  Do not use heating pads or hot water bottles on your feet. They may burn your skin. If you have lost feeling in your feet or legs, you may not know this is happening until it is too late.  Protect your feet from hot and cold by wearing shoes, such as at the beach or on hot pavement.  Schedule a complete foot exam at least once a year (annually) or more often if you have foot problems. Report any cuts, sores, or bruises to your health care provider immediately. Where to find more information  American Diabetes Association: www.diabetes.org  Association of Diabetes Care & Education Specialists: www.diabeteseducator.org Contact a health care provider if:  You have a medical condition that increases your risk of infection and   you have any cuts, sores, or bruises on your feet.  You have an injury that is not healing.  You have redness on your legs or feet.  You feel burning or tingling in your legs or feet.  You have pain or cramps in your legs and feet.  Your legs or feet are numb.  Your feet always feel cold.  You have pain around any toenails. Get help right away if:  You have a wound, scrape, corn, or callus on your foot and: ? You have pain, swelling, or redness that gets worse. ? You have fluid or blood coming from the wound, scrape, corn, or callus. ? Your wound, scrape, corn, or callus feels warm to the touch. ? You have pus or a bad smell coming from  the wound, scrape, corn, or callus. ? You have a fever. ? You have a red line going up your leg. Summary  Check your feet every day for blisters, cuts, bruises, sores, and redness.  Apply a moisturizing lotion or petroleum jelly to the skin on your feet and to dry, brittle toenails.  Wear shoes that fit properly and have enough cushioning.  If you have foot problems, report any cuts, sores, or bruises to your health care provider immediately.  Schedule a complete foot exam at least once a year (annually) or more often if you have foot problems. This information is not intended to replace advice given to you by your health care provider. Make sure you discuss any questions you have with your health care provider. Document Revised: 06/18/2020 Document Reviewed: 06/18/2020 Elsevier Patient Education  2021 Elsevier Inc.  

## 2021-01-05 LAB — CMP14+EGFR
ALT: 17 IU/L (ref 0–44)
AST: 43 IU/L — ABNORMAL HIGH (ref 0–40)
Albumin/Globulin Ratio: 1.6 (ref 1.2–2.2)
Albumin: 4.2 g/dL (ref 4.1–5.2)
Alkaline Phosphatase: 109 IU/L (ref 44–121)
BUN/Creatinine Ratio: 16 (ref 9–20)
BUN: 8 mg/dL (ref 6–20)
Bilirubin Total: 0.3 mg/dL (ref 0.0–1.2)
CO2: 23 mmol/L (ref 20–29)
Calcium: 10.2 mg/dL (ref 8.7–10.2)
Chloride: 94 mmol/L — ABNORMAL LOW (ref 96–106)
Creatinine, Ser: 0.5 mg/dL — ABNORMAL LOW (ref 0.76–1.27)
GFR calc Af Amer: 177 mL/min/{1.73_m2} (ref >59)
GFR calc non Af Amer: 153 mL/min/{1.73_m2} (ref >59)
Globulin, Total: 2.7 g/dL (ref 1.5–4.5)
Glucose: 357 mg/dL — ABNORMAL HIGH (ref 65–99)
Potassium: 4.4 mmol/L (ref 3.5–5.2)
Sodium: 137 mmol/L (ref 134–144)
Total Protein: 6.9 g/dL (ref 6.0–8.5)

## 2021-01-05 LAB — LIPID PANEL
Chol/HDL Ratio: 15.4 ratio — ABNORMAL HIGH (ref 0.0–5.0)
Cholesterol, Total: 323 mg/dL — ABNORMAL HIGH (ref 100–199)
HDL: 21 mg/dL — ABNORMAL LOW (ref >39)
Triglycerides: 1144 mg/dL (ref 0–149)

## 2021-01-05 LAB — SPECIMEN STATUS REPORT

## 2021-01-05 LAB — MICROALBUMIN / CREATININE URINE RATIO
Creatinine, Urine: 52.7 mg/dL
Microalb/Creat Ratio: 872 mg/g creat — ABNORMAL HIGH (ref 0–29)
Microalbumin, Urine: 459.7 ug/mL

## 2021-01-05 LAB — CBC WITH DIFFERENTIAL/PLATELET
Basophils Absolute: 0.1 10*3/uL (ref 0.0–0.2)
Basos: 1 %
EOS (ABSOLUTE): 0.1 10*3/uL (ref 0.0–0.4)
Eos: 2 %
Hematocrit: 48 % (ref 37.5–51.0)
Hemoglobin: 16.9 g/dL (ref 13.0–17.7)
Immature Grans (Abs): 0 10*3/uL (ref 0.0–0.1)
Immature Granulocytes: 0 %
Lymphocytes Absolute: 1.9 10*3/uL (ref 0.7–3.1)
Lymphs: 42 %
MCH: 30.5 pg (ref 26.6–33.0)
MCHC: 35.2 g/dL (ref 31.5–35.7)
MCV: 87 fL (ref 79–97)
Monocytes Absolute: 0.4 10*3/uL (ref 0.1–0.9)
Monocytes: 8 %
Neutrophils Absolute: 2.1 10*3/uL (ref 1.4–7.0)
Neutrophils: 47 %
Platelets: 301 10*3/uL (ref 150–450)
RBC: 5.54 x10E6/uL (ref 4.14–5.80)
RDW: 14 % (ref 11.6–15.4)
WBC: 4.4 10*3/uL (ref 3.4–10.8)

## 2021-01-05 LAB — LDL CHOLESTEROL, DIRECT: LDL Direct: 106 mg/dL — ABNORMAL HIGH (ref 0–99)

## 2021-02-05 ENCOUNTER — Ambulatory Visit: Payer: Medicaid Other | Admitting: Nurse Practitioner

## 2021-02-05 ENCOUNTER — Other Ambulatory Visit: Payer: Self-pay

## 2021-02-05 ENCOUNTER — Encounter: Payer: Self-pay | Admitting: Nurse Practitioner

## 2021-02-05 ENCOUNTER — Ambulatory Visit: Payer: Medicaid Other | Admitting: Internal Medicine

## 2021-02-05 VITALS — BP 137/87 | HR 91 | Temp 98.3°F | Ht 65.0 in | Wt 203.2 lb

## 2021-02-05 DIAGNOSIS — Z23 Encounter for immunization: Secondary | ICD-10-CM | POA: Diagnosis not present

## 2021-02-05 DIAGNOSIS — E108 Type 1 diabetes mellitus with unspecified complications: Secondary | ICD-10-CM

## 2021-02-05 LAB — BAYER DCA HB A1C WAIVED: HB A1C (BAYER DCA - WAIVED): 11.6 % — ABNORMAL HIGH (ref <7.0)

## 2021-02-05 MED ORDER — LANTUS SOLOSTAR 100 UNIT/ML ~~LOC~~ SOPN
36.0000 [IU] | PEN_INJECTOR | SUBCUTANEOUS | 3 refills | Status: DC
Start: 2021-02-05 — End: 2021-03-12

## 2021-02-05 NOTE — Progress Notes (Signed)
   Subjective:    Patient ID: Stephen Richardson, male    DOB: 02-13-1996, 25 y.o.   MRN: [id number]   Chief Complaint: Diabetes   HPI Patient come sin today for recheck of diabetes. He has not been watching his diet or exercising. He was last seen on 01/04/21 his hgba1c was 11.8. he increased his lantus to 30 u daily. He has not been checking his blood sugars very often and when he does it has been high.   Review of Systems  Constitutional: Negative for diaphoresis.  Eyes: Negative for pain.  Respiratory: Negative for shortness of breath.   Cardiovascular: Negative for chest pain, palpitations and leg swelling.  Gastrointestinal: Negative for abdominal pain.  Endocrine: Negative for polydipsia.  Skin: Negative for rash.  Neurological: Negative for dizziness, weakness and headaches.  Hematological: Does not bruise/bleed easily.  All other systems reviewed and are negative.      Objective:   Physical Exam Vitals and nursing note reviewed.  Constitutional:      Appearance: Normal appearance.  Cardiovascular:     Rate and Rhythm: Normal rate and regular rhythm.     Heart sounds: Normal heart sounds.  Pulmonary:     Effort: Pulmonary effort is normal.     Breath sounds: Normal breath sounds.  Skin:    General: Skin is warm.  Neurological:     General: No focal deficit present.     Mental Status: He is alert and oriented to person, place, and time.  Psychiatric:        Mood and Affect: Mood normal.        Behavior: Behavior normal.   BP 137/87   Pulse 91   Temp 98.3 F (36.8 C)   Ht 5\' 5"  (1.651 m)   Wt 203 lb 3.2 oz (92.2 kg)   SpO2 96%   BMI 33.81 kg/m    hgba1c 11.6%     Assessment & Plan:  Stephen Richardson in today with chief complaint of Diabetes   1. Type 1 diabetes mellitus with complications (HCC) Increase lantus to 36u for 1 week then to 40u daily Follow up in a month strict low carb diet - Bayer DCA Hb A1c Waived - insulin glargine (LANTUS SOLOSTAR) 100  UNIT/ML Solostar Pen; Inject 36 Units into the skin every morning.  Dispense: 30 mL; Refill: 3    The above assessment and management plan was discussed with the patient. The patient verbalized understanding of and has agreed to the management plan. Patient is aware to call the clinic if symptoms persist or worsen. Patient is aware when to return to the clinic for a follow-up visit. Patient educated on when it is appropriate to go to the emergency department.   Mary-Margaret Carollee Leitz, FNP

## 2021-02-05 NOTE — Patient Instructions (Signed)

## 2021-03-08 ENCOUNTER — Ambulatory Visit: Payer: Self-pay | Admitting: Nurse Practitioner

## 2021-03-12 ENCOUNTER — Other Ambulatory Visit: Payer: Self-pay

## 2021-03-12 ENCOUNTER — Ambulatory Visit: Payer: Medicaid Other | Admitting: Nurse Practitioner

## 2021-03-12 ENCOUNTER — Encounter: Payer: Self-pay | Admitting: Nurse Practitioner

## 2021-03-12 VITALS — BP 127/78 | HR 87 | Temp 98.3°F | Resp 20 | Ht 65.0 in | Wt 198.0 lb

## 2021-03-12 DIAGNOSIS — E108 Type 1 diabetes mellitus with unspecified complications: Secondary | ICD-10-CM | POA: Diagnosis not present

## 2021-03-12 LAB — BAYER DCA HB A1C WAIVED: HB A1C (BAYER DCA - WAIVED): 12.5 % — ABNORMAL HIGH (ref <7.0)

## 2021-03-12 MED ORDER — LANTUS SOLOSTAR 100 UNIT/ML ~~LOC~~ SOPN: 50.0000 [IU] | PEN_INJECTOR | SUBCUTANEOUS | 3 refills | Status: DC

## 2021-03-12 NOTE — Progress Notes (Signed)
   Subjective:    Patient ID: Stephen Richardson, male    DOB: [birth year], 25 y.o.   MRN: [id number]   Chief Complaint: Diabetes   HPI Patient comes in today for diabetes only. He has not been watching diet and his hgba1c was 11.6. he was encouraged to watch diet closely, exercise and increase lantus to 40u nightly. Since then his blood sugars have been running 120-160 most of the time. He has been watching diet and has lost some weight. Wt Readings from Last 3 Encounters:  03/12/21 198 lb (89.8 kg)  02/05/21 203 lb 3.2 oz (92.2 kg)  01/04/21 206 lb (93.4 kg)      Review of Systems  Constitutional: Negative for diaphoresis.  Eyes: Negative for pain.  Respiratory: Negative for shortness of breath.   Cardiovascular: Negative for chest pain, palpitations and leg swelling.  Gastrointestinal: Negative for abdominal pain.  Endocrine: Negative for polydipsia.  Skin: Negative for rash.  Neurological: Negative for dizziness, weakness and headaches.  Hematological: Does not bruise/bleed easily.  All other systems reviewed and are negative.      Objective:   Physical Exam Vitals and nursing note reviewed.  Constitutional:      Appearance: Normal appearance.  Cardiovascular:     Rate and Rhythm: Normal rate and regular rhythm.     Heart sounds: Normal heart sounds.  Pulmonary:     Breath sounds: Normal breath sounds.  Skin:    General: Skin is warm.  Neurological:     General: No focal deficit present.     Mental Status: He is alert and oriented to person, place, and time.  Psychiatric:        Mood and Affect: Mood normal.        Behavior: Behavior normal.    BP 127/78   Pulse 87   Temp 98.3 F (36.8 C) (Temporal)   Resp 20   Ht 5\' 5"  (1.651 m)   Wt 198 lb (89.8 kg)   SpO2 98%   BMI 32.95 kg/m   HGBa1c 12.5       Assessment & Plan:  Stephen Richardson in today with chief complaint of Diabetes   1. Type 1 diabetes mellitus with complications (HCC) hgba1c is worsening  despite increasing insulin Will make him an appointment with clinical pharmacist. - Bayer DCA Hb A1c Waived - insulin glargine (LANTUS SOLOSTAR) 100 UNIT/ML Solostar Pen; Inject 50 Units into the skin every morning.  Dispense: 30 mL; Refill: 3    The above assessment and management plan was discussed with the patient. The patient verbalized understanding of and has agreed to the management plan. Patient is aware to call the clinic if symptoms persist or worsen. Patient is aware when to return to the clinic for a follow-up visit. Patient educated on when it is appropriate to go to the emergency department.   Mary-Margaret Carollee Leitz, FNP

## 2021-03-12 NOTE — Patient Instructions (Signed)

## 2021-04-05 ENCOUNTER — Ambulatory Visit: Payer: Medicaid Other | Admitting: Pharmacist

## 2021-04-05 ENCOUNTER — Ambulatory Visit: Payer: Self-pay | Admitting: Nurse Practitioner

## 2021-04-09 ENCOUNTER — Telehealth: Payer: Self-pay | Admitting: Pharmacist

## 2021-04-09 ENCOUNTER — Ambulatory Visit (INDEPENDENT_AMBULATORY_CARE_PROVIDER_SITE_OTHER): Payer: Medicaid Other | Admitting: Pharmacist

## 2021-04-09 ENCOUNTER — Other Ambulatory Visit: Payer: Self-pay

## 2021-04-09 DIAGNOSIS — E108 Type 1 diabetes mellitus with unspecified complications: Secondary | ICD-10-CM

## 2021-04-09 MED ORDER — FREESTYLE LIBRE 2 SENSOR MISC: 11 refills | Status: DC

## 2021-04-09 MED ORDER — FREESTYLE LIBRE 2 READER DEVI: 0 refills | Status: DC

## 2021-04-09 NOTE — Progress Notes (Signed)
    04/09/2021 Name: Stephen Richardson MRN: [id number] DOB: May 05, 1996   S:  24 YOM Presents for diabetes evaluation, education, and management Patient was referred and last seen by Primary Care Provider on 03/12/21.  Patient's mom was present for visit  Insurance coverage/medication affordability: MEDICAID  Patient reports adherence with medications. . Current diabetes medications include: LANTUS, METFORMIN . Current hypertension medications include: LISINOPRIL Goal 130/80 . Current hyperlipidemia medications include: LIPITOR, VASCEPA  Patient denies hypoglycemic events.   Patient reported dietary habits: Eats 3 meals/day Discussed meal planning options and Plate method for healthy eating . Avoid sugary drinks and desserts . Incorporate balanced protein, non starchy veggies, 1 serving of carbohydrate with each meal . Increase water intake . Increase physical activity as able . Drinks:diet dr. Reino Kent  Patient-reported exercise habits: walks   O:  Lab Results  Component Value Date   HGBA1C 12.5 (H) 03/12/2021    Lipid Panel     Component Value Date/Time   CHOL 323 (H) 01/04/2021 1418   TRIG 1,144 (HH) 01/04/2021 1418   HDL 21 (L) 01/04/2021 1418   CHOLHDL 15.4 (H) 01/04/2021 1418   LDLCALC Comment (A) 01/04/2021 1418   LDLDIRECT 106 (H) 01/04/2021 1418    Home fasting blood sugars: 200s  2 hour post-meal/random blood sugars: n/a.   A/P:  Diabetes t1dm currently UNCONTROLLED. Patient is able to verbalize appropriate hypoglycemia management plan. Patient is not always adherent with medication.  He misses some insulin doses.  Control is suboptimal due to diet/lifestyle/noncompliance.  -Continue Lantus/metformin  Patient has declined multiple injections per day per endocrine note in the past  -Libre 2 CGM called in for patient to better assess blood sugars and we will form plan after 2 weeks of BGs.  Encouraged patient to take increased basal insulin dose as he was  recently prescribed  -Extensively discussed pathophysiology of diabetes, recommended lifestyle interventions, dietary effects on blood sugar control  -Counseled on s/sx of and management of hypoglycemia  -Next A1C anticipated July 2022.  Written patient instructions provided.  Total time in face to face counseling 25 minutes.   Follow up Pharmacist in 2 weeks  Kieth Brightly, PharmD, BCPS Clinical Pharmacist, [facility]  II Phone [phone]

## 2021-04-09 NOTE — Telephone Encounter (Signed)
PA submitted via covermymeds.

## 2021-04-12 NOTE — Telephone Encounter (Signed)
Reader = Approved on April 29 PA Case: [id number], Status: Approved, Coverage Starts on: 04/09/2021 12:00:00 AM, Coverage Ends on: 10/06/2021 12:00:00 AM  [facility] aware

## 2021-04-23 ENCOUNTER — Ambulatory Visit (INDEPENDENT_AMBULATORY_CARE_PROVIDER_SITE_OTHER): Payer: Medicaid Other | Admitting: Pharmacist

## 2021-04-23 ENCOUNTER — Other Ambulatory Visit: Payer: Self-pay

## 2021-04-23 DIAGNOSIS — E108 Type 1 diabetes mellitus with unspecified complications: Secondary | ICD-10-CM | POA: Diagnosis not present

## 2021-04-23 MED ORDER — INSULIN LISPRO (1 UNIT DIAL) 100 UNIT/ML (KWIKPEN): 10.0000 [IU] | PEN_INJECTOR | Freq: Three times a day (TID) | SUBCUTANEOUS | 11 refills | Status: DC

## 2021-04-23 NOTE — Progress Notes (Signed)
    04/23/2021 Name: Stephen Richardson MRN: [id number] DOB: 10-14-1996   S:  24 YOM Presents for diabetes evaluation, education, and management Patient was referred and last seen by Primary Care Provider on 03/12/21.  Patient's mom was present for visit  Insurance coverage/medication affordability: MEDICAID  Patient reports adherence with medications.  Current diabetes medications include: LANTUS, METFORMIN  Current hypertension medications include: LISINOPRIL Goal 130/80  Current hyperlipidemia medications include: LIPITOR, VASCEPA  Patient denies hypoglycemic events.   Patient reported dietary habits: Eats 3 meals/day Discussed meal planning options and Plate method for healthy eating  Avoid sugary drinks and desserts  Incorporate balanced protein, non starchy veggies, 1 serving of carbohydrate with each meal  Increase water intake  Increase physical activity as able  Drinks:diet dr. Reino Kent  Patient-reported exercise habits: walks  O:  Lab Results  Component Value Date   HGBA1C 12.5 (H) 03/12/2021    Lipid Panel     Component Value Date/Time   CHOL 323 (H) 01/04/2021 1418   TRIG 1,144 (HH) 01/04/2021 1418   HDL 21 (L) 01/04/2021 1418   CHOLHDL 15.4 (H) 01/04/2021 1418   LDLCALC Comment (A) 01/04/2021 1418   LDLDIRECT 106 (H) 01/04/2021 1418       A/P:  Diabetes T1DM currently uncontrolled. Patient is able to verbalize appropriate hypoglycemia management plan. Patient is not adherent with medication. Control is suboptimal due to diet/lifestyle/noncompliance Patient is interested in insulin pump, however he is not on multiple insulin injections daily and has refused this in the past per endocrine documentation.  He will have to prove he can manage this regimen before insurance would consider him for a pump.  Would refer to endocrine at that time.  -Increased Lantus to 55-60 units daily  -Add Humalog 10 units 3 times daily with meals  -Continue  metformin  -Extensively discussed pathophysiology of diabetes, recommended lifestyle interventions, dietary effects on blood sugar control  -Counseled on s/sx of and management of hypoglycemia  Written patient instructions provided.  Total time in face to face counseling 30 minutes.   Follow up PCP Clinic Visit ON 05/14/21.   Kieth Brightly, PharmD, BCPS Clinical Pharmacist, [facility]  II Phone [phone]

## 2021-04-24 ENCOUNTER — Other Ambulatory Visit: Payer: Self-pay | Admitting: Nurse Practitioner

## 2021-04-24 DIAGNOSIS — E785 Hyperlipidemia, unspecified: Secondary | ICD-10-CM

## 2021-04-24 DIAGNOSIS — E1169 Type 2 diabetes mellitus with other specified complication: Secondary | ICD-10-CM

## 2021-05-07 ENCOUNTER — Other Ambulatory Visit: Payer: Self-pay

## 2021-05-07 ENCOUNTER — Ambulatory Visit (INDEPENDENT_AMBULATORY_CARE_PROVIDER_SITE_OTHER): Payer: Medicaid Other | Admitting: Pharmacist

## 2021-05-07 DIAGNOSIS — E108 Type 1 diabetes mellitus with unspecified complications: Secondary | ICD-10-CM | POA: Diagnosis not present

## 2021-05-07 MED ORDER — FREESTYLE PRECISION NEO TEST VI STRP: ORAL_STRIP | 12 refills | Status: DC

## 2021-05-07 MED ORDER — INSULIN LISPRO (1 UNIT DIAL) 100 UNIT/ML (KWIKPEN): 12.0000 [IU] | PEN_INJECTOR | Freq: Three times a day (TID) | SUBCUTANEOUS | 11 refills | Status: DC

## 2021-05-07 MED ORDER — LANTUS SOLOSTAR 100 UNIT/ML ~~LOC~~ SOPN: 58.0000 [IU] | PEN_INJECTOR | SUBCUTANEOUS | 3 refills | Status: DC

## 2021-05-07 NOTE — Progress Notes (Signed)
    05/14/2021 Name: Stephen Richardson MRN: [id number] DOB: Jun 28, 1996   S:  24 yoM Presents for diabetes evaluation, education, and management Patient was referred and last seen by Primary Care Provider on 03/12/21.  Insurance coverage/medication affordability: [employer] operational [employer] with medications.  Current diabetes medications include:LANTUS, METFORMIN  Current hypertension medications include:LISINOPRIL Goal 130/80  Current hyperlipidemia medications include:LIPITOR, VASCEPA   Patient denies hypoglycemic events.   Patient reported dietary habits: Eats67meals/day Discussed meal planning options and Plate method for healthy eating  Avoid sugary drinks and desserts  Incorporate balanced protein, non starchy veggies, 1 serving of carbohydrate with each meal  Increase water intake  Increase physical activity as able  Drinks:diet dr. Reino Kent  Patient-reported exercise habits:walks O:  Lab Results  Component Value Date   HGBA1C 12.5 (H) 03/12/2021   Lipid Panel     Component Value Date/Time   CHOL 323 (H) 01/04/2021 1418   TRIG 1,144 (HH) 01/04/2021 1418   HDL 21 (L) 01/04/2021 1418   CHOLHDL 15.4 (H) 01/04/2021 1418   LDLCALC Comment (A) 01/04/2021 1418   LDLDIRECT 106 (H) 01/04/2021 1418      A/P:  Diabetes T1DM currently uncontrolled. Patient is able to verbalize appropriate hypoglycemia management plan. Patient is not adherent with medication. Control is suboptimal due to diet/lifestyle/noncompliance Patient is interested in insulin pump, however he is not on multiple insulin injections daily and has refused this in the past per endocrine documentation.  He will have to prove he can manage this regimen before insurance would consider him for a pump.  Would refer to endocrine at that time.  -Increased Lantus to 60 units daily  -INCREASE Humalog 12 units 3 times daily with meals  -Continue metformin  -Extensively discussed  pathophysiology of diabetes, recommended lifestyle interventions, dietary effects on blood sugar control  -Counseled on s/sx of and management of hypoglycemia  -Next A1C anticipated NEXT WEEK.   Written patient instructions provided.  Total time in face to face counseling 30 minutes.    Kieth Brightly, PharmD, BCPS Clinical Pharmacist, [facility]  II Phone [phone]

## 2021-05-12 ENCOUNTER — Ambulatory Visit: Payer: Medicaid Other | Admitting: Internal Medicine

## 2021-05-14 ENCOUNTER — Ambulatory Visit: Payer: Medicaid Other | Admitting: Nurse Practitioner

## 2021-05-14 ENCOUNTER — Other Ambulatory Visit: Payer: Self-pay

## 2021-05-14 ENCOUNTER — Encounter: Payer: Self-pay | Admitting: Nurse Practitioner

## 2021-05-14 VITALS — BP 134/79 | HR 102 | Temp 98.3°F | Resp 20 | Ht 65.0 in | Wt 203.0 lb

## 2021-05-14 DIAGNOSIS — E108 Type 1 diabetes mellitus with unspecified complications: Secondary | ICD-10-CM

## 2021-05-14 LAB — BAYER DCA HB A1C WAIVED: HB A1C (BAYER DCA - WAIVED): 10.4 % — ABNORMAL HIGH (ref <7.0)

## 2021-05-14 NOTE — Progress Notes (Addendum)
Subjective:    Patient ID: Stephen Richardson, male    DOB: 12-13-1995, 25 y.o.   MRN: [id number]   Chief Complaint: Diabetes    HPI:  1. Type 1 diabetes mellitus with complications [facility]) Patient has had diabetes for several years. He has not been under control in quite sometime. He has been seeing the clinical pharmacist. At last visit she increased his lantus to 60u daily and humalog 12u TID with meals. He does not check his blood sugars like he should nor does he watch his diet. His blood sugars are running 85-110 fasting. He has been trying to watch diet and has bene exercising. Weight is down 5lbs. Lab Results  Component Value Date   HGBA1C 12.5 (H) 03/12/2021    Wt Readings from Last 3 Encounters:  05/14/21 203 lb (92.1 kg)  03/12/21 198 lb (89.8 kg)  02/05/21 203 lb 3.2 oz (92.2 kg)         Outpatient Encounter Medications as of 05/14/2021  Medication Sig  . Accu-Chek FastClix Lancets MISC Test BS QID Dx E10.8  . atorvastatin (LIPITOR) 80 MG tablet TAKE 1 TABLET BY MOUTH EVERY DAY  . blood glucose meter kit and supplies Dispense based on patient and insurance preference. Use up to four times daily as directed. (FOR ICD-10 E10.9, E11.9).  Marland Kitchen Blood Glucose Monitoring Suppl (ACCU-CHEK GUIDE) w/Device KIT 1 kit by Other route 4 (four) times daily as needed. Dx E11.9  . Continuous Blood Gluc Receiver (FREESTYLE LIBRE 2 READER) DEVI Use to test blood sugar 6 times daily as directed.  . Continuous Blood Gluc Sensor (FREESTYLE LIBRE 2 SENSOR) MISC Use to test blood sugar 6 times daily as directed.  . fenofibrate (TRICOR) 145 MG tablet Take 1 tablet (145 mg total) by mouth daily.  Marland Kitchen glucose blood (FREESTYLE PRECISION NEO TEST) test strip Use as instructed to test blood sugar 6 times daily as directed.  Use test strip with freestyle libre 2 meter  . icosapent Ethyl (VASCEPA) 1 g capsule Take 2 capsules (2 g total) by mouth 2 (two) times daily.  . insulin glargine (LANTUS SOLOSTAR) 100  UNIT/ML Solostar Pen Inject 58 Units into the skin every morning.  . insulin lispro (HUMALOG KWIKPEN) 100 UNIT/ML KwikPen Inject 12 Units into the skin 3 (three) times daily with meals.  . Insulin Pen Needle (B-D UF III MINI PEN NEEDLES) 31G X 5 MM MISC Use with insulin daily Dx E10.8  . lisdexamfetamine (VYVANSE) 50 MG capsule Take 1 capsule (50 mg total) by mouth daily.  Marland Kitchen lisdexamfetamine (VYVANSE) 50 MG capsule Take 1 capsule (50 mg total) by mouth daily.  Marland Kitchen lisdexamfetamine (VYVANSE) 50 MG capsule Take 1 capsule (50 mg total) by mouth daily.  Marland Kitchen lisinopril (ZESTRIL) 5 MG tablet Take 1 tablet (5 mg total) by mouth daily.  . metFORMIN (GLUCOPHAGE) 500 MG tablet Take 1 tablet (500 mg total) by mouth 2 (two) times daily with a meal.  . potassium chloride (KLOR-CON) 10 MEQ tablet Take 1 tablet (10 mEq total) by mouth 2 (two) times daily.   No facility-administered encounter medications on file as of 05/14/2021.    History reviewed. No pertinent surgical history.  Family History  Problem Relation Age of Onset  . Kidney disease Father   . Diabetes Father   . Pancreatitis Father     New complaints: None today  Social history: Lives with his mom  Controlled substance contract: n/a    Review of Systems  Constitutional: Negative for  diaphoresis.  Eyes: Negative for pain.  Respiratory: Negative for shortness of breath.   Cardiovascular: Negative for chest pain, palpitations and leg swelling.  Gastrointestinal: Negative for abdominal pain.  Endocrine: Negative for polydipsia.  Skin: Negative for rash.  Neurological: Negative for dizziness, weakness and headaches.  Hematological: Does not bruise/bleed easily.  All other systems reviewed and are negative.      Objective:   Physical Exam Vitals and nursing note reviewed.  Constitutional:      Appearance: Normal appearance.  Cardiovascular:     Rate and Rhythm: Normal rate and regular rhythm.     Heart sounds: Normal heart  sounds.  Pulmonary:     Effort: Pulmonary effort is normal.     Breath sounds: Normal breath sounds.  Skin:    General: Skin is warm.  Neurological:     General: No focal deficit present.     Mental Status: He is alert and oriented to person, place, and time.  Psychiatric:        Mood and Affect: Mood normal.        Behavior: Behavior normal.    BP 134/79   Pulse (!) 102   Temp 98.3 F (36.8 C) (Temporal)   Resp 20   Ht _0  (1.651 m)   Wt 203 lb (92.1 kg)   SpO2 96%   BMI 33.78 kg/m   hgba1c 10.4%       Assessment & Plan:  Stephen Richardson in today with chief complaint of Diabetes   1. Type 1 diabetes mellitus with complications (HCC) Continue to watch diet and exercise Continue lantus and humalog as prescribed Keep diary of blood sugars.  Follow up in a month - Bayer DCA Hb A1c Waived    The above assessment and management plan was discussed with the patient. The patient verbalized understanding of and has agreed to the management plan. Patient is aware to call the clinic if symptoms persist or worsen. Patient is aware when to return to the clinic for a follow-up visit. Patient educated on when it is appropriate to go to the emergency department.   Mary-Margaret Hassell Done, FNP

## 2021-05-14 NOTE — Patient Instructions (Signed)

## 2021-05-27 ENCOUNTER — Other Ambulatory Visit: Payer: Self-pay | Admitting: *Deleted

## 2021-05-27 MED ORDER — FREESTYLE LIBRE 2 READER DEVI: 0 refills | Status: DC

## 2021-06-07 ENCOUNTER — Telehealth: Payer: Medicaid Other | Admitting: Internal Medicine

## 2021-06-07 ENCOUNTER — Telehealth: Payer: Self-pay | Admitting: Internal Medicine

## 2021-06-07 NOTE — Telephone Encounter (Signed)
Pt missed phone call for his virtual appt and would like to know if Dr. Rennis Golden can reach back out to him. Please Advise

## 2021-06-07 NOTE — Telephone Encounter (Signed)
Left message to call back to r/s appointment that was missed this morning. There is an opening on 06/09/21 for a video visit with [facility] MD

## 2021-06-11 ENCOUNTER — Ambulatory Visit: Payer: Self-pay | Admitting: Nurse Practitioner

## 2021-06-17 ENCOUNTER — Encounter: Payer: Self-pay | Admitting: Nurse Practitioner

## 2021-06-21 ENCOUNTER — Other Ambulatory Visit: Payer: Self-pay

## 2021-06-21 ENCOUNTER — Encounter: Payer: Self-pay | Admitting: Nurse Practitioner

## 2021-06-21 ENCOUNTER — Ambulatory Visit (INDEPENDENT_AMBULATORY_CARE_PROVIDER_SITE_OTHER): Payer: Medicaid Other | Admitting: Nurse Practitioner

## 2021-06-21 VITALS — BP 133/86 | HR 78 | Temp 97.7°F | Resp 20 | Ht 65.0 in | Wt 207.0 lb

## 2021-06-21 DIAGNOSIS — E108 Type 1 diabetes mellitus with unspecified complications: Secondary | ICD-10-CM

## 2021-06-21 LAB — BAYER DCA HB A1C WAIVED: HB A1C (BAYER DCA - WAIVED): 7.9 % — ABNORMAL HIGH (ref <7.0)

## 2021-06-21 NOTE — Patient Instructions (Signed)
https://www.diabeteseducator.org/docs/default-source/living-with-diabetes/conquering-the-grocery-store-v1.pdf?sfvrsn=4">  Carbohydrate Counting for Diabetes Mellitus, Adult Carbohydrate counting is a method of keeping track of how many carbohydrates you eat. Eating carbohydrates naturally increases the amount of sugar (glucose) in the blood. Counting how many carbohydrates you eat improves your bloodglucose control, which helps you manage your diabetes. It is important to know how many carbohydrates you can safely have in each meal. This is different for every person. A dietitian can help you make a meal plan and calculate how many carbohydrates you should have at each meal andsnack. What foods contain carbohydrates? Carbohydrates are found in the following foods: Grains, such as breads and cereals. Dried beans and soy products. Starchy vegetables, such as potatoes, peas, and corn. Fruit and fruit juices. Milk and yogurt. Sweets and snack foods, such as cake, cookies, candy, chips, and soft drinks. How do I count carbohydrates in foods? There are two ways to count carbohydrates in food. You can read food labels or learn standard serving sizes of foods. You can use either of the methods or acombination of both. Using the Nutrition Facts label The Nutrition Facts list is included on the labels of almost all packaged foods and beverages in the [place] It includes: The serving size. Information about nutrients in each serving, including the grams (g) of carbohydrate per serving. To use the Nutrition Facts: Decide how many servings you will have. Multiply the number of servings by the number of carbohydrates per serving. The resulting number is the total amount of carbohydrates that you will be having. Learning the standard serving sizes of foods When you eat carbohydrate foods that are not packaged or do not include Nutrition Facts on the label, you need to measure the servings in order to count the  amount of carbohydrates. Measure the foods that you will eat with a food scale or measuring cup, if needed. Decide how many standard-size servings you will eat. Multiply the number of servings by 15. For foods that contain carbohydrates, one serving equals 15 g of carbohydrates. For example, if you eat 2 cups or 10 oz (300 g) of strawberries, you will have eaten 2 servings and 30 g of carbohydrates (2 servings x 15 g = 30 g). For foods that have more than one food mixed, such as soups and casseroles, you must count the carbohydrates in each food that is included. The following list contains standard serving sizes of common carbohydrate-rich foods. Each of these servings has about 15 g of carbohydrates: 1 slice of bread. 1 six-inch (15 cm) tortilla. ? cup or 2 oz (53 g) cooked rice or pasta.  cup or 3 oz (85 g) cooked or canned, drained and rinsed beans or lentils.  cup or 3 oz (85 g) starchy vegetable, such as peas, corn, or squash.  cup or 4 oz (120 g) hot cereal.  cup or 3 oz (85 g) boiled or mashed potatoes, or  or 3 oz (85 g) of a large baked potato.  cup or 4 fl oz (118 mL) fruit juice. 1 cup or 8 fl oz (237 mL) milk. 1 small or 4 oz (106 g) apple.  or 2 oz (63 g) of a medium banana. 1 cup or 5 oz (150 g) strawberries. 3 cups or 1 oz (24 g) popped popcorn. What is an example of carbohydrate counting? To calculate the number of carbohydrates in this sample meal, follow the stepsshown below. Sample meal 3 oz (85 g) chicken breast. ? cup or 4 oz (106 g) brown rice.    cup or 3 oz (85 g) corn. 1 cup or 8 fl oz (237 mL) milk. 1 cup or 5 oz (150 g) strawberries with sugar-free whipped topping. Carbohydrate calculation Identify the foods that contain carbohydrates: Rice. Corn. Milk. Strawberries. Calculate how many servings you have of each food: 2 servings rice. 1 serving corn. 1 serving milk. 1 serving strawberries. Multiply each number of servings by 15 g: 2 servings  rice x 15 g = 30 g. 1 serving corn x 15 g = 15 g. 1 serving milk x 15 g = 15 g. 1 serving strawberries x 15 g = 15 g. Add together all of the amounts to find the total grams of carbohydrates eaten: 30 g + 15 g + 15 g + 15 g = 75 g of carbohydrates total. What are tips for following this plan? Shopping Develop a meal plan and then make a shopping list. Buy fresh and frozen vegetables, fresh and frozen fruit, dairy, eggs, beans, lentils, and whole grains. Look at food labels. Choose foods that have more fiber and less sugar. Avoid processed foods and foods with added sugars. Meal planning Aim to have the same amount of carbohydrates at each meal and for each snack time. Plan to have regular, balanced meals and snacks. Where to find more information American Diabetes Association: www.diabetes.org Centers for Disease Control and Prevention: www.cdc.gov Summary Carbohydrate counting is a method of keeping track of how many carbohydrates you eat. Eating carbohydrates naturally increases the amount of sugar (glucose) in the blood. Counting how many carbohydrates you eat improves your blood glucose control, which helps you manage your diabetes. A dietitian can help you make a meal plan and calculate how many carbohydrates you should have at each meal and snack. This information is not intended to replace advice given to you by your health care provider. Make sure you discuss any questions you have with your healthcare provider. Document Revised: 11/28/2019 Document Reviewed: 11/29/2019 Elsevier Patient Education  2021 Elsevier Inc.  

## 2021-06-21 NOTE — Progress Notes (Signed)
   Subjective:    Patient ID: Stephen Richardson, male    DOB: 01-26-1996, 25 y.o.   MRN: [id number]   Chief Complaint: diabetes  HPI  Patient comes in today for diabetes recheck. The clinical pharmacist and I are working together to get his hgba1c under control. He is on lantus and metformin and humalog sliding scale 3x a day. His blood sugar has have been running around 122 fasting in the mornings. He has been walking everyday, and trying to watch diet. Lab Results  Component Value Date   HGBA1C 10.4 (H) 05/14/2021      Review of Systems  Constitutional:  Negative for diaphoresis.  Eyes:  Negative for pain.  Respiratory:  Negative for shortness of breath.   Cardiovascular:  Negative for chest pain, palpitations and leg swelling.  Gastrointestinal:  Negative for abdominal pain.  Endocrine: Negative for polydipsia.  Skin:  Negative for rash.  Neurological:  Negative for dizziness, weakness and headaches.  Hematological:  Does not bruise/bleed easily.  All other systems reviewed and are negative.     Objective:   Physical Exam Constitutional:      Appearance: Normal appearance. He is obese.  Cardiovascular:     Rate and Rhythm: Normal rate and regular rhythm.     Heart sounds: Normal heart sounds.  Pulmonary:     Breath sounds: Normal breath sounds.  Skin:    General: Skin is warm.  Neurological:     General: No focal deficit present.     Mental Status: He is alert and oriented to person, place, and time.  Psychiatric:        Mood and Affect: Mood normal.        Behavior: Behavior normal.    BP 133/86   Pulse 78   Temp 97.7 F (36.5 C) (Temporal)   Resp 20   Ht 5\' 5"  (1.651 m)   Wt 207 lb (93.9 kg)   SpO2 94%   BMI 34.45 kg/m   HGBA1c 7.9%     Assessment & Plan:  Stephen Richardson in today with chief complaint of Diabetes   1. Type 1 diabetes mellitus with complications (HCC) Continue current meds Daily exercise Watch carbs in diet - Bayer DCA Hb A1c  Waived    The above assessment and management plan was discussed with the patient. The patient verbalized understanding of and has agreed to the management plan. Patient is aware to call the clinic if symptoms persist or worsen. Patient is aware when to return to the clinic for a follow-up visit. Patient educated on when it is appropriate to go to the emergency department.   Mary-Margaret Carollee Leitz, FNP

## 2021-07-08 ENCOUNTER — Ambulatory Visit: Payer: Medicaid Other | Admitting: Pharmacist

## 2021-07-30 ENCOUNTER — Telehealth: Payer: Self-pay

## 2021-07-30 NOTE — Telephone Encounter (Signed)
Received notification that [employer] needed a PA.   Cover my meds information:  Key: [id number] Last name: Stephen Richardson DOB: 09/06/1996  Will route to Larita Fife to assist.  Thank you!

## 2021-08-02 NOTE — Telephone Encounter (Signed)
**Note De-Identified Varnika Butz Obfuscation** Khoi Hamberger (Key: [id number]) Vascepa 1GM capsules  Form: [facility] Electronic [place] Form [phone] NCPDP) Created Determination: The member recently filled this medication and will be able to return for their next refill according to their plan limits.  [facility] called [facility] and was advised by the pharmacist that the pt picked up his last 90 day supply of Vascepa on July 22 and is not due for a refill until October of this year. No PA required at this time.

## 2021-08-18 ENCOUNTER — Ambulatory Visit: Payer: Medicaid Other | Admitting: Nurse Practitioner

## 2021-08-18 ENCOUNTER — Other Ambulatory Visit: Payer: Self-pay

## 2021-08-18 ENCOUNTER — Encounter: Payer: Self-pay | Admitting: Nurse Practitioner

## 2021-08-18 VITALS — BP 130/85 | HR 76 | Temp 99.4°F | Resp 20 | Ht 65.0 in | Wt 217.0 lb

## 2021-08-18 DIAGNOSIS — H60331 Swimmer's ear, right ear: Secondary | ICD-10-CM

## 2021-08-18 MED ORDER — CIPROFLOXACIN-DEXAMETHASONE 0.3-0.1 % OT SUSP: 4.0000 [drp] | Freq: Two times a day (BID) | OTIC | 0 refills | Status: DC

## 2021-08-18 NOTE — Progress Notes (Signed)
   Subjective:    Patient ID: Stephen Richardson, male    DOB: 1996-08-26, 25 y.o.   MRN: [id number]   Chief Complaint: Ear Pain (Right ear. Started yesterday/)   HPI Patient comes in today c/o right ear pain. Started yesterday. Painful to touch. No drainage.     Review of Systems  Constitutional:  Negative for chills and fever.  HENT:  Positive for ear pain. Negative for congestion, ear discharge, facial swelling, hearing loss, sinus pressure, sinus pain and sore throat.   Respiratory:  Positive for cough.   Neurological:  Negative for dizziness and headaches.      Objective:   Physical Exam Vitals and nursing note reviewed.  Constitutional:      Appearance: Normal appearance.  HENT:     Right Ear: Tympanic membrane and external ear normal.     Left Ear: Tympanic membrane, ear canal and external ear normal.     Ears:     Comments: Tragus and auricle pain right ear Right ear canal erythematous and edematous.    Nose: Nose normal.     Mouth/Throat:     Mouth: Mucous membranes are moist.  Eyes:     Pupils: Pupils are equal, round, and reactive to light.  Cardiovascular:     Rate and Rhythm: Normal rate and regular rhythm.     Heart sounds: Normal heart sounds.  Pulmonary:     Effort: Pulmonary effort is normal.     Breath sounds: Normal breath sounds.  Skin:    General: Skin is warm and dry.  Neurological:     General: No focal deficit present.     Mental Status: He is alert and oriented to person, place, and time.    BP 130/85   Pulse 76   Temp 99.4 F (37.4 C) (Temporal)   Resp 20   Ht 5\' 5"  (1.651 m)   Wt 217 lb (98.4 kg)   SpO2 98%   BMI 36.11 kg/m        Assessment & Plan:  [name] in today with chief complaint of Ear Pain (Right ear. Started yesterday/)   1. Acute swimmer's ear of right side Avoid getting water in ear Tylenol for pain Meds ordered this encounter  Medications   ciprofloxacin-dexamethasone (CIPRODEX) OTIC suspension    Sig:  Place 4 drops into the right ear 2 (two) times daily.    Dispense:  7.5 mL    Refill:  0    Order Specific Question:   Supervising Provider    Answer:   Carollee Leitz A [[id number]]       The above assessment and management plan was discussed with the patient. The patient verbalized understanding of and has agreed to the management plan. Patient is aware to call the clinic if symptoms persist or worsen. Patient is aware when to return to the clinic for a follow-up visit. Patient educated on when it is appropriate to go to the emergency department.   Mary-Margaret Arville Care, FNP

## 2021-08-18 NOTE — Patient Instructions (Signed)
Otitis Externa Otitis externa is an infection of the outer ear canal. The outer ear canal is the area between the outside of the ear and the eardrum. Otitis externa is sometimes called swimmer's ear. What are the causes? Common causes of this condition include: Swimming in dirty water. Moisture in the ear. An injury to the inside of the ear. An object stuck in the ear. A cut or scrape on the outside of the ear or in the ear canal. What increases the risk? You are more likely to get this condition if you go swimming often. What are the signs or symptoms? Itching in the ear. This is often the first symptom. Swelling of the ear. Redness in the ear. Ear pain. The pain may get worse when you pull on your ear. Pus coming from the ear. How is this treated? This condition may be treated with: Antibiotic ear drops. These are often given for 10-14 days. Medicines to reduce itching and swelling. Follow these instructions at home: If you were prescribed antibiotic ear drops, use them as told by your doctor. Do not stop using them even if you start to feel better. Take over-the-counter and prescription medicines only as told by your doctor. Avoid getting water in your ears as told by your doctor. You may be told to avoid swimming or water sports for a few days. Keep all follow-up visits. How is this prevented? Keep your ears dry. Use the corner of a towel to dry your ears after you swim or bathe. Try not to scratch or put things in your ear. Doing these things makes it easier for germs to grow in your ear. Avoid swimming in lakes, dirty water, or swimming pools that may not have the right amount of a chemical called chlorine. Contact a doctor if: You have a fever. Your ear is still red, swollen, or painful after 3 days. You still have pus coming from your ear after 3 days. Your redness, swelling, or pain gets worse. You have a very bad headache. Get help right away if: You have redness,  swelling, and pain or tenderness behind your ear. Summary Otitis externa is an infection of the outer ear canal. Symptoms include pain, redness, and swelling of the ear. If you were prescribed antibiotic ear drops, use them as told by your doctor. Do not stop using them even if you start to feel better. Try not to scratch or put things in your ear. This information is not intended to replace advice given to you by your health care provider. Make sure you discuss any questions you have with your health care provider. Document Revised: 02/10/2021 Document Reviewed: 02/10/2021 Elsevier Patient Education  2022 Elsevier Inc.  

## 2021-08-21 ENCOUNTER — Other Ambulatory Visit: Payer: Self-pay | Admitting: Nurse Practitioner

## 2021-08-23 NOTE — Telephone Encounter (Signed)
[facility] - reader was denied - sensors are approved. He got a reader in April and is only allowed 1 per year.   LM for pt to call back to discuss. Does he really need a new reader?   If so, can you use a smart phone?  Not sure what OOP cost would be for a reader without coverage.

## 2021-08-25 NOTE — Telephone Encounter (Signed)
Needs a new reader. He says the reader he has cuts off a lot. He cannot use a smart phone

## 2021-09-03 NOTE — Telephone Encounter (Signed)
Pt called - aware that insurance will not caover another reader - aware that if he thinks it is technical issues to call the company and see if it can be replaced.

## 2021-10-01 ENCOUNTER — Encounter: Payer: Self-pay | Admitting: Nurse Practitioner

## 2021-10-01 ENCOUNTER — Other Ambulatory Visit: Payer: Self-pay

## 2021-10-01 ENCOUNTER — Ambulatory Visit: Payer: Medicaid Other | Admitting: Nurse Practitioner

## 2021-10-01 VITALS — BP 134/79 | HR 87 | Temp 99.2°F | Resp 20 | Ht 65.0 in | Wt 226.0 lb

## 2021-10-01 DIAGNOSIS — E876 Hypokalemia: Secondary | ICD-10-CM | POA: Diagnosis not present

## 2021-10-01 DIAGNOSIS — E1169 Type 2 diabetes mellitus with other specified complication: Secondary | ICD-10-CM | POA: Diagnosis not present

## 2021-10-01 DIAGNOSIS — E108 Type 1 diabetes mellitus with unspecified complications: Secondary | ICD-10-CM | POA: Diagnosis not present

## 2021-10-01 DIAGNOSIS — Z6837 Body mass index (BMI) 37.0-37.9, adult: Secondary | ICD-10-CM

## 2021-10-01 DIAGNOSIS — Z23 Encounter for immunization: Secondary | ICD-10-CM | POA: Diagnosis not present

## 2021-10-01 DIAGNOSIS — I10 Essential (primary) hypertension: Secondary | ICD-10-CM | POA: Diagnosis not present

## 2021-10-01 DIAGNOSIS — R6250 Unspecified lack of expected normal physiological development in childhood: Secondary | ICD-10-CM

## 2021-10-01 DIAGNOSIS — F900 Attention-deficit hyperactivity disorder, predominantly inattentive type: Secondary | ICD-10-CM | POA: Diagnosis not present

## 2021-10-01 DIAGNOSIS — E785 Hyperlipidemia, unspecified: Secondary | ICD-10-CM | POA: Diagnosis not present

## 2021-10-01 LAB — BAYER DCA HB A1C WAIVED: HB A1C (BAYER DCA - WAIVED): 5.6 % (ref 4.8–5.6)

## 2021-10-01 MED ORDER — LISDEXAMFETAMINE DIMESYLATE 50 MG PO CAPS: 50.0000 mg | ORAL_CAPSULE | Freq: Every day | ORAL | 0 refills | Status: DC

## 2021-10-01 MED ORDER — POTASSIUM CHLORIDE ER 10 MEQ PO TBCR: 10.0000 meq | EXTENDED_RELEASE_TABLET | Freq: Two times a day (BID) | ORAL | 1 refills | Status: DC

## 2021-10-01 MED ORDER — INSULIN LISPRO (1 UNIT DIAL) 100 UNIT/ML (KWIKPEN): 12.0000 [IU] | PEN_INJECTOR | Freq: Three times a day (TID) | SUBCUTANEOUS | 11 refills | Status: DC

## 2021-10-01 MED ORDER — ICOSAPENT ETHYL 1 G PO CAPS: 2.0000 g | ORAL_CAPSULE | Freq: Two times a day (BID) | ORAL | 1 refills | Status: DC

## 2021-10-01 MED ORDER — ATORVASTATIN CALCIUM 80 MG PO TABS: 80.0000 mg | ORAL_TABLET | Freq: Every day | ORAL | 1 refills | Status: DC

## 2021-10-01 MED ORDER — FENOFIBRATE 145 MG PO TABS: 145.0000 mg | ORAL_TABLET | Freq: Every day | ORAL | 1 refills | Status: DC

## 2021-10-01 MED ORDER — LISINOPRIL 5 MG PO TABS: 5.0000 mg | ORAL_TABLET | Freq: Every day | ORAL | 1 refills | Status: DC

## 2021-10-01 MED ORDER — LANTUS SOLOSTAR 100 UNIT/ML ~~LOC~~ SOPN: 58.0000 [IU] | PEN_INJECTOR | SUBCUTANEOUS | 3 refills | Status: DC

## 2021-10-01 MED ORDER — METFORMIN HCL 500 MG PO TABS: 500.0000 mg | ORAL_TABLET | Freq: Two times a day (BID) | ORAL | 3 refills | Status: DC

## 2021-10-01 NOTE — Progress Notes (Signed)
Subjective:    Patient ID: Stephen Richardson, male    DOB: Jan 22, 1996, 25 y.o.   MRN: [id number]  Chief Complaint: Medical Management of Chronic Issues    HPI:  1. Type 1 diabetes mellitus with complications (HCC) Fasting blood sugars are running around 120-140. No low blood sugars. Lab Results  Component Value Date   HGBA1C 7.9 (H) 06/21/2021     2. Essential hypertension No c/o chest pain, sob or headache. Does not check blood pressure at home. BP Readings from Last 3 Encounters:  10/01/21 134/79  08/18/21 130/85  06/21/21 133/86     3. Hyperlipidemia associated with type 2 diabetes mellitus ([place]) Trying to watch dietmand has been walking daily. Lab Results  Component Value Date   CHOL 323 (H) 01/04/2021   HDL 21 (L) 01/04/2021   LDLCALC Comment (A) 01/04/2021   LDLDIRECT 106 (H) 01/04/2021   TRIG 1,144 (HH) 01/04/2021   CHOLHDL 15.4 (H) 01/04/2021     4. Hypokalemia No lower ext cramping  5. Attention deficit hyperactivity disorder (ADHD), predominantly inattentive type Is on vyvanse daily. Has been on this for years. Is not able to calm down or concentrate without meds  6. Developmental delay Lives with his mom. Is not able to live on his own. He has aged out and is no longer able to go to school.  7. BMI 37.0-37.9, adult Weight is up 9lbs Wt Readings from Last 3 Encounters:  10/01/21 226 lb (102.5 kg)  08/18/21 217 lb (98.4 kg)  06/21/21 207 lb (93.9 kg)   BMI Readings from Last 3 Encounters:  10/01/21 37.61 kg/m  08/18/21 36.11 kg/m  06/21/21 34.45 kg/m       Outpatient Encounter Medications as of 10/01/2021  Medication Sig   Accu-Chek FastClix Lancets MISC Test BS QID Dx E10.8   atorvastatin (LIPITOR) 80 MG tablet TAKE 1 TABLET BY MOUTH EVERY DAY   blood glucose meter kit and supplies Dispense based on patient and insurance preference. Use up to four times daily as directed. (FOR ICD-10 E10.9, E11.9).   Blood Glucose Monitoring Suppl  (ACCU-CHEK GUIDE) w/Device KIT 1 kit by Other route 4 (four) times daily as needed. Dx E11.9   ciprofloxacin-dexamethasone (CIPRODEX) OTIC suspension Place 4 drops into the right ear 2 (two) times daily.   Continuous Blood Gluc Receiver (FREESTYLE LIBRE 2 READER) DEVI Use to test blood sugar 6 times daily as directed.   Continuous Blood Gluc Sensor (FREESTYLE LIBRE 2 SENSOR) MISC Use to test blood sugar 6 times daily as directed.   fenofibrate (TRICOR) 145 MG tablet Take 1 tablet (145 mg total) by mouth daily.   glucose blood (FREESTYLE PRECISION NEO TEST) test strip Use as instructed to test blood sugar 6 times daily as directed.  Use test strip with freestyle libre 2 meter   icosapent Ethyl (VASCEPA) 1 g capsule Take 2 capsules (2 g total) by mouth 2 (two) times daily.   insulin glargine (LANTUS SOLOSTAR) 100 UNIT/ML Solostar Pen Inject 58 Units into the skin every morning. (Patient taking differently: Inject 60 Units into the skin every morning.)   insulin lispro (HUMALOG KWIKPEN) 100 UNIT/ML KwikPen Inject 12 Units into the skin 3 (three) times daily with meals.   Insulin Pen Needle (B-D UF III MINI PEN NEEDLES) 31G X 5 MM MISC Use with insulin daily Dx E10.8   lisinopril (ZESTRIL) 5 MG tablet Take 1 tablet (5 mg total) by mouth daily.   metFORMIN (GLUCOPHAGE) 500 MG tablet Take 1  tablet (500 mg total) by mouth 2 (two) times daily with a meal.   potassium chloride (KLOR-CON) 10 MEQ tablet Take 1 tablet (10 mEq total) by mouth 2 (two) times daily.   lisdexamfetamine (VYVANSE) 50 MG capsule Take 1 capsule (50 mg total) by mouth daily.   lisdexamfetamine (VYVANSE) 50 MG capsule Take 1 capsule (50 mg total) by mouth daily.   lisdexamfetamine (VYVANSE) 50 MG capsule Take 1 capsule (50 mg total) by mouth daily.   No facility-administered encounter medications on file as of 10/01/2021.    History reviewed. No pertinent surgical history.  Family History  Problem Relation Age of Onset   Kidney  disease Father    Diabetes Father    Pancreatitis Father     New complaints: None today  Social history: Stephen Richardson with his mom  Controlled substance contract: mom is ot here to sign and not sure he can sign for himself     Review of Systems  Constitutional:  Negative for diaphoresis.  Eyes:  Negative for pain.  Respiratory:  Negative for shortness of breath.   Cardiovascular:  Negative for chest pain, palpitations and leg swelling.  Gastrointestinal:  Negative for abdominal pain.  Endocrine: Negative for polydipsia.  Skin:  Negative for rash.  Neurological:  Negative for dizziness, weakness and headaches.  Hematological:  Does not bruise/bleed easily.  All other systems reviewed and are negative.     Objective:   Physical Exam Vitals and nursing note reviewed.  Constitutional:      Appearance: Normal appearance. He is well-developed.  HENT:     Head: Normocephalic.     Nose: Nose normal.  Eyes:     Pupils: Pupils are equal, round, and reactive to light.  Neck:     Thyroid: No thyroid mass or thyromegaly.     Vascular: No carotid bruit or JVD.     Trachea: Phonation normal.  Cardiovascular:     Rate and Rhythm: Normal rate and regular rhythm.     Heart sounds: Murmur (2/6 systolic) heard.  Pulmonary:     Effort: Pulmonary effort is normal. No respiratory distress.     Breath sounds: Normal breath sounds.  Abdominal:     General: Bowel sounds are normal.     Palpations: Abdomen is soft.     Tenderness: There is no abdominal tenderness.  Musculoskeletal:        General: Normal range of motion.     Cervical back: Normal range of motion and neck supple.  Lymphadenopathy:     Cervical: No cervical adenopathy.  Skin:    General: Skin is warm and dry.  Neurological:     Mental Status: He is alert and oriented to person, place, and time.  Psychiatric:        Behavior: Behavior normal.        Thought Content: Thought content normal.        Judgment: Judgment normal.    BP 134/79   Pulse 87   Temp 99.2 F (37.3 C) (Temporal)   Resp 20   Ht 5' 5"  (1.651 m)   Wt 226 lb (102.5 kg)   SpO2 97%   BMI 37.61 kg/m    Hgba1c 5.6%     Assessment & Plan:  Stephen Richardson comes in today with chief complaint of Medical Management of Chronic Issues   Diagnosis and orders addressed:  1. Type 1 diabetes mellitus with complications (HCC) Continue to watch carbs in diet - Bayer DCA Hb A1c Waived -  insulin glargine (LANTUS SOLOSTAR) 100 UNIT/ML Solostar Pen; Inject 58 Units into the skin every morning.  Dispense: 30 mL; Refill: 3 - metFORMIN (GLUCOPHAGE) 500 MG tablet; Take 1 tablet (500 mg total) by mouth 2 (two) times daily with a meal.  Dispense: 180 tablet; Refill: 3  2. Essential hypertension Low sodium diet - CBC with Differential/Platelet - CMP14+EGFR - lisinopril (ZESTRIL) 5 MG tablet; Take 1 tablet (5 mg total) by mouth daily.  Dispense: 90 tablet; Refill: 1  3. Hyperlipidemia associated with type 2 diabetes mellitus (HCC) Low fat diet - Lipid panel - atorvastatin (LIPITOR) 80 MG tablet; Take 1 tablet (80 mg total) by mouth daily.  Dispense: 90 tablet; Refill: 1 - icosapent Ethyl (VASCEPA) 1 g capsule; Take 2 capsules (2 g total) by mouth 2 (two) times daily.  Dispense: 360 capsule; Refill: 1 - fenofibrate (TRICOR) 145 MG tablet; Take 1 tablet (145 mg total) by mouth daily.  Dispense: 90 tablet; Refill: 1  4. Hypokalemia - potassium chloride (KLOR-CON) 10 MEQ tablet; Take 1 tablet (10 mEq total) by mouth 2 (two) times daily.  Dispense: 90 tablet; Refill: 1  5. Attention deficit hyperactivity disorder (ADHD), predominantly inattentive type Stress management - lisdexamfetamine (VYVANSE) 50 MG capsule; Take 1 capsule (50 mg total) by mouth daily.  Dispense: 30 capsule; Refill: 0 - lisdexamfetamine (VYVANSE) 50 MG capsule; Take 1 capsule (50 mg total) by mouth daily.  Dispense: 30 capsule; Refill: 0 - lisdexamfetamine (VYVANSE) 50 MG capsule; Take  1 capsule (50 mg total) by mouth daily.  Dispense: 30 capsule; Refill: 0  6. Developmental delay  7. BMI 37.0-37.9, adult Discussed diet and exercise for person with BMI >25 Will recheck weight in 3-6 months    Labs pending Health Maintenance reviewed Diet and exercise encouraged  Follow up plan: 3 months   Mary-Margaret Hassell Done, FNP

## 2021-10-02 LAB — CMP14+EGFR
ALT: 7 IU/L (ref 0–44)
AST: 17 IU/L (ref 0–40)
Albumin/Globulin Ratio: 1.9 (ref 1.2–2.2)
Albumin: 4.6 g/dL (ref 4.1–5.2)
Alkaline Phosphatase: 66 IU/L (ref 44–121)
BUN/Creatinine Ratio: 30 — ABNORMAL HIGH (ref 9–20)
BUN: 14 mg/dL (ref 6–20)
Bilirubin Total: 0.3 mg/dL (ref 0.0–1.2)
CO2: 22 mmol/L (ref 20–29)
Calcium: 9.6 mg/dL (ref 8.7–10.2)
Chloride: 102 mmol/L (ref 96–106)
Creatinine, Ser: 0.46 mg/dL — ABNORMAL LOW (ref 0.76–1.27)
Globulin, Total: 2.4 g/dL (ref 1.5–4.5)
Glucose: 90 mg/dL (ref 70–99)
Potassium: 4.5 mmol/L (ref 3.5–5.2)
Sodium: 137 mmol/L (ref 134–144)
Total Protein: 7 g/dL (ref 6.0–8.5)
eGFR: 151 mL/min/{1.73_m2} (ref >59)

## 2021-10-02 LAB — CBC WITH DIFFERENTIAL/PLATELET
Basophils Absolute: 0.1 10*3/uL (ref 0.0–0.2)
Basos: 1 %
EOS (ABSOLUTE): 0.1 10*3/uL (ref 0.0–0.4)
Eos: 2 %
Hematocrit: 44.6 % (ref 37.5–51.0)
Hemoglobin: 15.6 g/dL (ref 13.0–17.7)
Immature Grans (Abs): 0 10*3/uL (ref 0.0–0.1)
Immature Granulocytes: 0 %
Lymphocytes Absolute: 2.8 10*3/uL (ref 0.7–3.1)
Lymphs: 41 %
MCH: 30.1 pg (ref 26.6–33.0)
MCHC: 35 g/dL (ref 31.5–35.7)
MCV: 86 fL (ref 79–97)
Monocytes Absolute: 0.4 10*3/uL (ref 0.1–0.9)
Monocytes: 6 %
Neutrophils Absolute: 3.3 10*3/uL (ref 1.4–7.0)
Neutrophils: 50 %
Platelets: 337 10*3/uL (ref 150–450)
RBC: 5.18 x10E6/uL (ref 4.14–5.80)
RDW: 12.8 % (ref 11.6–15.4)
WBC: 6.6 10*3/uL (ref 3.4–10.8)

## 2021-10-02 LAB — LIPID PANEL
Chol/HDL Ratio: 7.3 ratio — ABNORMAL HIGH (ref 0.0–5.0)
Cholesterol, Total: 220 mg/dL — ABNORMAL HIGH (ref 100–199)
HDL: 30 mg/dL — ABNORMAL LOW (ref >39)
LDL Chol Calc (NIH): 106 mg/dL — ABNORMAL HIGH (ref 0–99)
Triglycerides: 486 mg/dL — ABNORMAL HIGH (ref 0–149)
VLDL Cholesterol Cal: 84 mg/dL — ABNORMAL HIGH (ref 5–40)

## 2021-11-14 ENCOUNTER — Other Ambulatory Visit: Payer: Self-pay | Admitting: Family

## 2021-12-30 ENCOUNTER — Encounter: Payer: Self-pay | Admitting: Nurse Practitioner

## 2021-12-30 ENCOUNTER — Ambulatory Visit: Payer: Medicaid Other | Admitting: Nurse Practitioner

## 2021-12-30 VITALS — BP 129/78 | HR 96 | Temp 98.1°F | Resp 20 | Ht 65.0 in | Wt 232.0 lb

## 2021-12-30 DIAGNOSIS — E785 Hyperlipidemia, unspecified: Secondary | ICD-10-CM | POA: Diagnosis not present

## 2021-12-30 DIAGNOSIS — E108 Type 1 diabetes mellitus with unspecified complications: Secondary | ICD-10-CM

## 2021-12-30 DIAGNOSIS — Q231 Congenital insufficiency of aortic valve: Secondary | ICD-10-CM

## 2021-12-30 DIAGNOSIS — I10 Essential (primary) hypertension: Secondary | ICD-10-CM

## 2021-12-30 DIAGNOSIS — E876 Hypokalemia: Secondary | ICD-10-CM | POA: Diagnosis not present

## 2021-12-30 DIAGNOSIS — F900 Attention-deficit hyperactivity disorder, predominantly inattentive type: Secondary | ICD-10-CM

## 2021-12-30 DIAGNOSIS — E1169 Type 2 diabetes mellitus with other specified complication: Secondary | ICD-10-CM | POA: Diagnosis not present

## 2021-12-30 DIAGNOSIS — Z6837 Body mass index (BMI) 37.0-37.9, adult: Secondary | ICD-10-CM

## 2021-12-30 DIAGNOSIS — R6250 Unspecified lack of expected normal physiological development in childhood: Secondary | ICD-10-CM | POA: Diagnosis not present

## 2021-12-30 LAB — BAYER DCA HB A1C WAIVED: HB A1C (BAYER DCA - WAIVED): 7.3 % — ABNORMAL HIGH (ref 4.8–5.6)

## 2021-12-30 MED ORDER — LANTUS SOLOSTAR 100 UNIT/ML ~~LOC~~ SOPN: 58.0000 [IU] | PEN_INJECTOR | SUBCUTANEOUS | 3 refills | Status: DC

## 2021-12-30 MED ORDER — ATORVASTATIN CALCIUM 80 MG PO TABS: 80.0000 mg | ORAL_TABLET | Freq: Every day | ORAL | 1 refills | Status: DC

## 2021-12-30 MED ORDER — LISINOPRIL 5 MG PO TABS: 5.0000 mg | ORAL_TABLET | Freq: Every day | ORAL | 1 refills | Status: DC

## 2021-12-30 MED ORDER — LISDEXAMFETAMINE DIMESYLATE 50 MG PO CAPS: 50.0000 mg | ORAL_CAPSULE | Freq: Every day | ORAL | 0 refills | Status: DC

## 2021-12-30 MED ORDER — POTASSIUM CHLORIDE ER 10 MEQ PO TBCR: 10.0000 meq | EXTENDED_RELEASE_TABLET | Freq: Two times a day (BID) | ORAL | 1 refills | Status: DC

## 2021-12-30 MED ORDER — ICOSAPENT ETHYL 1 G PO CAPS: 2.0000 g | ORAL_CAPSULE | Freq: Two times a day (BID) | ORAL | 1 refills | Status: DC

## 2021-12-30 MED ORDER — FENOFIBRATE 145 MG PO TABS: 145.0000 mg | ORAL_TABLET | Freq: Every day | ORAL | 1 refills | Status: DC

## 2021-12-30 MED ORDER — METFORMIN HCL 500 MG PO TABS: 500.0000 mg | ORAL_TABLET | Freq: Two times a day (BID) | ORAL | 3 refills | Status: DC

## 2021-12-30 NOTE — Progress Notes (Signed)
Subjective:    Patient ID: Stephen Richardson, male    DOB: March 17, 1996, 26 y.o.   MRN: [id number]   Chief Complaint: Medical Management of Chronic Issues    HPI:  Stephen Richardson is a 26 y.o. who identifies as a male who was assigned male at birth.   Social history: Lives with: his mom Work history: is not able to work due to developmental delay   Comes in today for follow up of the following chronic medical issues:  1. Type 1 diabetes mellitus with complications (HCC) Fasting blood sugars have not been checked lately. He did not know he had refills on his Elenor Legato so he has not been checking blood sugars. Lab Results  Component Value Date   HGBA1C 5.6 10/01/2021     2. Essential hypertension No c/o chest pain, sob or headache. Does not check blood pressure at home. BP Readings from Last 3 Encounters:  12/30/21 129/78  10/01/21 134/79  08/18/21 130/85     3. Hyperlipidemia associated with type 2 diabetes mellitus ([place]) Does not really watch diet but says he is walking daily. Lab Results  Component Value Date   CHOL 220 (H) 10/01/2021   HDL 30 (L) 10/01/2021   LDLCALC 106 (H) 10/01/2021   LDLDIRECT 106 (H) 01/04/2021   TRIG 486 (H) 10/01/2021   CHOLHDL 7.3 (H) 10/01/2021     4. Hypokalemia Is on daily potassium supplement. He denies nay lower ext cramping. Lab Results  Component Value Date   K 4.5 10/01/2021     5. Congenital aortic valve insufficiency Sees cardiology every 6  months  6. Developmental delay Is not able to live on his own and be independent  7. Attention deficit hyperactivity disorder (ADHD), predominantly inattentive type Is on vyvanse. Without it he is very hyper and cannot concentrate on anything.  8. BMI 37.0-37.9, adult Weight is up 6lbs Wt Readings from Last 3 Encounters:  12/30/21 232 lb (105.2 kg)  10/01/21 226 lb (102.5 kg)  08/18/21 217 lb (98.4 kg)   BMI Readings from Last 3 Encounters:  12/30/21 38.61 kg/m  10/01/21 37.61  kg/m  08/18/21 36.11 kg/m      New complaints: None today  No Known Allergies Outpatient Encounter Medications as of 12/30/2021  Medication Sig   Accu-Chek FastClix Lancets MISC Test BS QID Dx E10.8   atorvastatin (LIPITOR) 80 MG tablet Take 1 tablet (80 mg total) by mouth daily.   B-D UF III MINI PEN NEEDLES 31G X 5 MM MISC USE WITH INSULIN DAILY DX E10.8   blood glucose meter kit and supplies Dispense based on patient and insurance preference. Use up to four times daily as directed. (FOR ICD-10 E10.9, E11.9).   Blood Glucose Monitoring Suppl (ACCU-CHEK GUIDE) w/Device KIT 1 kit by Other route 4 (four) times daily as needed. Dx E11.9   Continuous Blood Gluc Receiver (FREESTYLE LIBRE 2 READER) DEVI Use to test blood sugar 6 times daily as directed.   Continuous Blood Gluc Sensor (FREESTYLE LIBRE 2 SENSOR) MISC Use to test blood sugar 6 times daily as directed.   fenofibrate (TRICOR) 145 MG tablet Take 1 tablet (145 mg total) by mouth daily.   glucose blood (FREESTYLE PRECISION NEO TEST) test strip Use as instructed to test blood sugar 6 times daily as directed.  Use test strip with freestyle libre 2 meter   icosapent Ethyl (VASCEPA) 1 g capsule Take 2 capsules (2 g total) by mouth 2 (two) times daily.   insulin glargine (  LANTUS SOLOSTAR) 100 UNIT/ML Solostar Pen Inject 58 Units into the skin every morning.   insulin lispro (HUMALOG KWIKPEN) 100 UNIT/ML KwikPen Inject 12 Units into the skin 3 (three) times daily with meals.   lisdexamfetamine (VYVANSE) 50 MG capsule Take 1 capsule (50 mg total) by mouth daily.   lisinopril (ZESTRIL) 5 MG tablet Take 1 tablet (5 mg total) by mouth daily.   metFORMIN (GLUCOPHAGE) 500 MG tablet Take 1 tablet (500 mg total) by mouth 2 (two) times daily with a meal.   potassium chloride (KLOR-CON) 10 MEQ tablet Take 1 tablet (10 mEq total) by mouth 2 (two) times daily.   [DISCONTINUED] ciprofloxacin-dexamethasone (CIPRODEX) OTIC suspension Place 4 drops into  the right ear 2 (two) times daily.   lisdexamfetamine (VYVANSE) 50 MG capsule Take 1 capsule (50 mg total) by mouth daily.   lisdexamfetamine (VYVANSE) 50 MG capsule Take 1 capsule (50 mg total) by mouth daily.   No facility-administered encounter medications on file as of 12/30/2021.    History reviewed. No pertinent surgical history.  Family History  Problem Relation Age of Onset   Kidney disease Father    Diabetes Father    Pancreatitis Father       Controlled substance contract: 10/04/21     Review of Systems  Constitutional:  Negative for diaphoresis.  Eyes:  Negative for pain.  Respiratory:  Negative for shortness of breath.   Cardiovascular:  Negative for chest pain, palpitations and leg swelling.  Gastrointestinal:  Negative for abdominal pain.  Endocrine: Negative for polydipsia.  Skin:  Negative for rash.  Neurological:  Negative for dizziness, weakness and headaches.  Hematological:  Does not bruise/bleed easily.  All other systems reviewed and are negative.     Objective:   Physical Exam Vitals and nursing note reviewed.  Constitutional:      Appearance: Normal appearance. He is well-developed.  HENT:     Head: Normocephalic.     Nose: Nose normal.     Mouth/Throat:     Mouth: Mucous membranes are moist.     Pharynx: Oropharynx is clear.  Eyes:     Pupils: Pupils are equal, round, and reactive to light.  Neck:     Thyroid: No thyroid mass or thyromegaly.     Vascular: No carotid bruit or JVD.     Trachea: Phonation normal.  Cardiovascular:     Rate and Rhythm: Normal rate and regular rhythm.  Pulmonary:     Effort: Pulmonary effort is normal. No respiratory distress.     Breath sounds: Normal breath sounds.  Abdominal:     General: Bowel sounds are normal.     Palpations: Abdomen is soft.     Tenderness: There is no abdominal tenderness.  Musculoskeletal:        General: Normal range of motion.     Cervical back: Normal range of motion and  neck supple.  Lymphadenopathy:     Cervical: No cervical adenopathy.  Skin:    General: Skin is warm and dry.  Neurological:     Mental Status: He is alert and oriented to person, place, and time.  Psychiatric:        Behavior: Behavior normal.        Thought Content: Thought content normal.        Judgment: Judgment normal.    BP 129/78    Pulse 96    Temp 98.1 F (36.7 C) (Temporal)    Resp 20    Ht 5' 5"  (  1.651 m)    Wt 232 lb (105.2 kg)    SpO2 95%    BMI 38.61 kg/m   Hg ba1c 7.3%     Assessment & Plan:   Stephen Richardson comes in today with chief complaint of Medical Management of Chronic Issues   Diagnosis and orders addressed:  1. Type 1 diabetes mellitus with complications (HCC) Wathc carbsin diet - Bayer DCA Hb A1c Waived - metFORMIN (GLUCOPHAGE) 500 MG tablet; Take 1 tablet (500 mg total) by mouth 2 (two) times daily with a meal.  Dispense: 180 tablet; Refill: 3 - insulin glargine (LANTUS SOLOSTAR) 100 UNIT/ML Solostar Pen; Inject 58 Units into the skin every morning.  Dispense: 30 mL; Refill: 3  2. Essential hypertension Low sodium diet - CBC with Differential/Platelet - CMP14+EGFR - lisinopril (ZESTRIL) 5 MG tablet; Take 1 tablet (5 mg total) by mouth daily.  Dispense: 90 tablet; Refill: 1  3. Hyperlipidemia associated with type 2 diabetes mellitus (HCC) Low fat diet - Lipid panel - atorvastatin (LIPITOR) 80 MG tablet; Take 1 tablet (80 mg total) by mouth daily.  Dispense: 90 tablet; Refill: 1 - icosapent Ethyl (VASCEPA) 1 g capsule; Take 2 capsules (2 g total) by mouth 2 (two) times daily.  Dispense: 360 capsule; Refill: 1 - fenofibrate (TRICOR) 145 MG tablet; Take 1 tablet (145 mg total) by mouth daily.  Dispense: 90 tablet; Refill: 1  4. Hypokalemia Labs pending - potassium chloride (KLOR-CON) 10 MEQ tablet; Take 1 tablet (10 mEq total) by mouth 2 (two) times daily.  Dispense: 90 tablet; Refill: 1  5. Congenital aortic valve insufficiency Need to keep  follow up appoointments with cardiology  6. Developmental delay  7. Attention deficit hyperactivity disorder (ADHD), predominantly inattentive type Stress management - lisdexamfetamine (VYVANSE) 50 MG capsule; Take 1 capsule (50 mg total) by mouth daily.  Dispense: 30 capsule; Refill: 0 - lisdexamfetamine (VYVANSE) 50 MG capsule; Take 1 capsule (50 mg total) by mouth daily.  Dispense: 30 capsule; Refill: 0 - lisdexamfetamine (VYVANSE) 50 MG capsule; Take 1 capsule (50 mg total) by mouth daily.  Dispense: 30 capsule; Refill: 0  8. BMI 37.0-37.9, adult Discussed diet and exercise for person with BMI >25 Will recheck weight in 3-6 months    Labs pending Health Maintenance reviewed Diet and exercise encouraged  Follow up plan: 3 months   Mary-Margaret Hassell Done, FNP

## 2021-12-31 LAB — CMP14+EGFR
ALT: 10 IU/L (ref 0–44)
AST: 14 IU/L (ref 0–40)
Albumin/Globulin Ratio: 1.6 (ref 1.2–2.2)
Albumin: 4.2 g/dL (ref 4.1–5.2)
Alkaline Phosphatase: 85 IU/L (ref 44–121)
BUN/Creatinine Ratio: 29 — ABNORMAL HIGH (ref 9–20)
BUN: 15 mg/dL (ref 6–20)
Bilirubin Total: 0.4 mg/dL (ref 0.0–1.2)
CO2: 22 mmol/L (ref 20–29)
Calcium: 9.5 mg/dL (ref 8.7–10.2)
Chloride: 99 mmol/L (ref 96–106)
Creatinine, Ser: 0.52 mg/dL — ABNORMAL LOW (ref 0.76–1.27)
Globulin, Total: 2.7 g/dL (ref 1.5–4.5)
Glucose: 196 mg/dL — ABNORMAL HIGH (ref 70–99)
Potassium: 4.4 mmol/L (ref 3.5–5.2)
Sodium: 139 mmol/L (ref 134–144)
Total Protein: 6.9 g/dL (ref 6.0–8.5)
eGFR: 144 mL/min/{1.73_m2} (ref >59)

## 2021-12-31 LAB — LIPID PANEL
Chol/HDL Ratio: 13 ratio — ABNORMAL HIGH (ref 0.0–5.0)
Cholesterol, Total: 324 mg/dL — ABNORMAL HIGH (ref 100–199)
HDL: 25 mg/dL — ABNORMAL LOW (ref >39)
Triglycerides: 1536 mg/dL (ref 0–149)

## 2021-12-31 LAB — CBC WITH DIFFERENTIAL/PLATELET
Basophils Absolute: 0.1 10*3/uL (ref 0.0–0.2)
Basos: 1 %
EOS (ABSOLUTE): 0.1 10*3/uL (ref 0.0–0.4)
Eos: 1 %
Hematocrit: 46.4 % (ref 37.5–51.0)
Hemoglobin: 16.4 g/dL (ref 13.0–17.7)
Immature Grans (Abs): 0 10*3/uL (ref 0.0–0.1)
Immature Granulocytes: 0 %
Lymphocytes Absolute: 2.5 10*3/uL (ref 0.7–3.1)
Lymphs: 34 %
MCH: 30.9 pg (ref 26.6–33.0)
MCHC: 35.3 g/dL (ref 31.5–35.7)
MCV: 87 fL (ref 79–97)
Monocytes Absolute: 0.4 10*3/uL (ref 0.1–0.9)
Monocytes: 6 %
Neutrophils Absolute: 4.2 10*3/uL (ref 1.4–7.0)
Neutrophils: 58 %
Platelets: 339 10*3/uL (ref 150–450)
RBC: 5.31 x10E6/uL (ref 4.14–5.80)
RDW: 12.9 % (ref 11.6–15.4)
WBC: 7.3 10*3/uL (ref 3.4–10.8)

## 2022-01-17 ENCOUNTER — Telehealth: Payer: Self-pay | Admitting: *Deleted

## 2022-01-17 DIAGNOSIS — E108 Type 1 diabetes mellitus with unspecified complications: Secondary | ICD-10-CM

## 2022-01-17 NOTE — Telephone Encounter (Signed)
Carollee Leitz Key: [id number] - PA Case ID: [id number] - Rx #: [id number] Need help? Call us at [phone] Drug FreeStyle [place] 2 Sensor Form [facility] [place] Electronic [place] Form [phone] NCPDP)  Last office note attached  Sent to  plan

## 2022-01-18 NOTE — Telephone Encounter (Signed)
Denied today PA Case: [id number], Status: Denied. Notification: Completed.  Denial may be bc of non compliance - we had to send office notes, which states he had not picked them up.   Also could be bc it is trying to fill too soon.

## 2022-01-24 ENCOUNTER — Encounter: Payer: Self-pay | Admitting: *Deleted

## 2022-01-24 DIAGNOSIS — Z006 Encounter for examination for normal comparison and control in clinical research program: Secondary | ICD-10-CM

## 2022-01-24 NOTE — Research (Signed)
Spoke with Stephen Richardson about Core research. He states that he would like to receive information about Core. Emailed info, my contact information, and  copy of the consent for him to review. Encouraged him to call with any questions.

## 2022-01-25 ENCOUNTER — Encounter: Payer: Self-pay | Admitting: *Deleted

## 2022-01-25 DIAGNOSIS — Z006 Encounter for examination for normal comparison and control in clinical research program: Secondary | ICD-10-CM

## 2022-01-25 NOTE — Patient Instructions (Signed)
Spoke with Stephen Richardson and his mom about coming in for a Run in visit for Core. Scheduled to come in March 14 at 1000. I did inform then that Stephen Rollo will need to be fasting for blood work.

## 2022-02-17 ENCOUNTER — Encounter: Payer: Self-pay | Admitting: *Deleted

## 2022-02-17 DIAGNOSIS — Z006 Encounter for examination for normal comparison and control in clinical research program: Secondary | ICD-10-CM

## 2022-02-17 NOTE — Research (Signed)
Message sent to [facility] Benjamin Stain, FNPthat pt is coming in for Core research screen. Daphine Deutscher, FNP  ok with this ? ?

## 2022-02-21 ENCOUNTER — Encounter: Payer: Self-pay | Admitting: *Deleted

## 2022-02-21 DIAGNOSIS — Z006 Encounter for examination for normal comparison and control in clinical research program: Secondary | ICD-10-CM

## 2022-02-21 NOTE — Patient Instructions (Signed)
Stephen Richardson called with reminder of appointment tomorrow at 1000. Reminded him nothing to eat or drink 10 hours prior to coming in. And gave him the parking code. Voices understanding.  ?

## 2022-02-22 ENCOUNTER — Encounter: Payer: Medicaid Other | Admitting: *Deleted

## 2022-02-22 ENCOUNTER — Other Ambulatory Visit: Payer: Self-pay

## 2022-02-22 ENCOUNTER — Encounter: Payer: Self-pay | Admitting: *Deleted

## 2022-02-22 VITALS — BP 140/73 | HR 103 | Temp 98.6°F | Resp 16 | Ht 65.0 in | Wt 222.4 lb

## 2022-02-22 DIAGNOSIS — Z006 Encounter for examination for normal comparison and control in clinical research program: Secondary | ICD-10-CM

## 2022-02-22 NOTE — Research (Addendum)
Subject came into the research clinic today (February 22, 2022) for the Screening Run-In Visit for the CORE research study.  ?Subject signed the informed consent under protocol amendment 2, Version 03 Mar 2021, before any assessments were completed. ?  ?  ?Subject Name: Stephen Richardson ?  ?Subject met inclusion and exclusion criteria.  The informed consent form, study requirements and expectations were reviewed with the subject and questions and concerns were addressed prior to the signing of the consent form.  The subject verbalized understanding of the trial requirements.  The subject agreed to participate in the CORE trial and signed the informed consent at [place] am on 02/22/22  The informed consent was obtained prior to performance of any protocol-specific procedures for the subject.  A copy of the signed informed consent was given to the subject and a copy was placed in the subject's medical record.  ? ? ?  ? ? ? ?Screening Run-In Clinic Visit ? ? ? ?Date of Visit: 22-February-2022   Subject #: [id number] ? ? ? ? ? ?During this visit the following activities were completed: ? ?_0 Reading, Signing and Understanding the informed Consent  ? ?_1 Review Inclusion/Exclusion Criteria ? ?_2 Vital Signs, Height, & Weight: ? ?- Blood pressure:140/73  (Subject sat supine for at least 5 minutes before blood pressure was performed) ?- Heart rate:103 ?- Temperature:98.6 ?- Respiratory Rate:16 ?- Oxygen Saturation:96% ?- Weight:222.4 lbs ?- Height:25f5in ? ?_3 Physical Exam done by PI or Sub-I- by Dr SLia Foyer ? ?_4 Review Subjects Medical History & Concomitant Medications ? ?_5 Review Any Adverse Events/ Serious Adverse Events ? ?_6 Review of any ER Visits, Hospitalizations and Inpatient Days ? ?_7 12-Lead ECG (Subject sat supine for at least 5 minutes before this was performed)  ?*All ECG's completed will be available in subjects binder  ? ?_8  Subject fasting  ? ?_9  Blood and Urine specimens collected per protocol  ? ?_10 Genetic Testing  Completed (Only for patients with suspected FCS) ? ?_11 Extended Urinalysis ? ?_12 Diet/Lifestyle/Alcohol Counseling with Subject ? ?_13 FCS Symptoms 2 Week Recall ? ?_14 Education/teaching subject on importance of completing the daily diary  ? ?_15 Education on the importance of complying with contraception precautions during study with subject agreement  ? ?Pt here with his mother.Spoke with pt and his mother about importance of pregnancy prevention during the research study. Mother states no need to worry about that. Pt states that he has a girlfriend, but are not sexual activity, and will not be. Reports no abd pain, no nausea, vomiting, diarrhea, and no visits to urgent care or ED. Vs taken 1001, EKG complete at 1037, blood work completed at 1056, and urine obtained at 1059. Informed pt I will call him at later time to schedule next appointment. ?  ?

## 2022-02-22 NOTE — Research (Addendum)
Spoke to  [employer] due to pt unable to enter information into I pad devise . Meriel Pica was able to rest I pad for pt next visit.

## 2022-02-22 NOTE — Research (Signed)
meds

## 2022-02-22 NOTE — Research (Addendum)
Dr Lia Foyer did Physical exam today 02-22-22 ? ? ?Current Outpatient Medications:  ?  Accu-Chek FastClix Lancets MISC, Test BS QID Dx E10.8, Disp: 102 each, Rfl: 11 ?  atorvastatin (LIPITOR) 80 MG tablet, Take 1 tablet (80 mg total) by mouth daily., Disp: 90 tablet, Rfl: 1 ?  B-D UF III MINI PEN NEEDLES 31G X 5 MM MISC, USE WITH INSULIN DAILY DX E10.8, Disp: 100 each, Rfl: 3 ?  blood glucose meter kit and supplies, Dispense based on patient and insurance preference. Use up to four times daily as directed. (FOR ICD-10 E10.9, E11.9)., Disp: 1 each, Rfl: 0 ?  Blood Glucose Monitoring Suppl (ACCU-CHEK GUIDE) w/Device KIT, 1 kit by Other route 4 (four) times daily as needed. Dx E11.9, Disp: 1 kit, Rfl: 0 ?  Continuous Blood Gluc Receiver (FREESTYLE LIBRE 2 READER) DEVI, Use to test blood sugar 6 times daily as directed., Disp: 1 each, Rfl: 0 ?  Continuous Blood Gluc Sensor (FREESTYLE LIBRE 2 SENSOR) MISC, Use to test blood sugar 6 times daily as directed., Disp: 2 each, Rfl: 11 ?  fenofibrate (TRICOR) 145 MG tablet, Take 1 tablet (145 mg total) by mouth daily., Disp: 90 tablet, Rfl: 1 ?  glucose blood (FREESTYLE PRECISION NEO TEST) test strip, Use as instructed to test blood sugar 6 times daily as directed.  Use test strip with freestyle libre 2 meter, Disp: 100 each, Rfl: 12 ?  icosapent Ethyl (VASCEPA) 1 g capsule, Take 2 capsules (2 g total) by mouth 2 (two) times daily., Disp: 360 capsule, Rfl: 1 ?  insulin glargine (LANTUS SOLOSTAR) 100 UNIT/ML Solostar Pen, Inject 58 Units into the skin every morning., Disp: 30 mL, Rfl: 3 ?  insulin lispro (HUMALOG KWIKPEN) 100 UNIT/ML KwikPen, Inject 12 Units into the skin 3 (three) times daily with meals., Disp: 15 mL, Rfl: 11 ?  [START ON 02/28/2022] lisdexamfetamine (VYVANSE) 50 MG capsule, Take 1 capsule (50 mg total) by mouth daily., Disp: 30 capsule, Rfl: 0 ?  lisinopril (ZESTRIL) 5 MG tablet, Take 1 tablet (5 mg total) by mouth daily., Disp: 90 tablet, Rfl: 1 ?  metFORMIN  (GLUCOPHAGE) 500 MG tablet, Take 1 tablet (500 mg total) by mouth 2 (two) times daily with a meal., Disp: 180 tablet, Rfl: 3 ?  potassium chloride (KLOR-CON) 10 MEQ tablet, Take 1 tablet (10 mEq total) by mouth 2 (two) times daily., Disp: 90 tablet, Rfl: 1 ?  lisdexamfetamine (VYVANSE) 50 MG capsule, Take 1 capsule (50 mg total) by mouth daily., Disp: 30 capsule, Rfl: 0 ?  lisdexamfetamine (VYVANSE) 50 MG capsule, Take 1 capsule (50 mg total) by mouth daily., Disp: 30 capsule, Rfl: 0  ? ? ?Reviewed pt meds no changes noted. ?Pt states he takes Lipitor for hyperlipidemia started on 01-02-20 ?Tricor started 11-26-18 for Hypertriglyceridemia   ?Vasepa started 07-24-20 for hypertriglyceridemia ?Lantus insulin started 10-30-19 for Type 1 Diabetes ?Humalog insulin started 04-23-21 for Type 1 Diabetes ?Vyvanse started 10-18-16 for ADHD ?Lisinopril started 06-13-16 for Essential HTN ?Metformin started 03-01-19 for Type 1 diabetes  ?Potassium Chloride started 11-05-19 for Hypokalemia   ? ?

## 2022-02-22 NOTE — Progress Notes (Signed)
Patient seen and evaluated.  Previously seen by Dr. Rennis Golden for elevated triglycerides.  These have been consistently elevated, but variable in severity.  He denies prior history of pancreatitis, but other members of the family have.  He has DM since about the age of 7.  He is here for CORE study prescreening.  ? ?Exam ? ?Alert, oriented NAD ?BP 140/73 ?No JVD ?Lungs clear ?!-2/6 SEM at ULSE without gallop.  ?Abdomen soft and no obvious hepatosplenomegaly ?Ext No edema.  Pulses intact ? ?Long discussion today in terms he could understand.  Drug and purposed explained in great detail.  Ashby Dawes of randomization also discussed.  We discussed sexual activity in great detail.  He states that he and his girlfriend do not engage in sex and have no plans to do so until they are married.  We went back over this in great detail, and the reasons this is an issue in this study.  He voiced understanding, and the need for safe contraception should that change.  Staff also spoke with the patient's mother, who confirmed the son's situation and position on the topic.   ? ?He meets inclusion and exclusion for screening and will proceed.  I will discuss his case with the study PI, Dr. Italy Hilty.   ? ?Bonnee Quin, MD, [facility], FSCAI ?Medical Director, [facility] ?

## 2022-02-24 ENCOUNTER — Encounter: Payer: Self-pay | Admitting: *Deleted

## 2022-02-24 NOTE — Research (Addendum)
? ? ? ? ? ? ? ? ?Above labs for [employer]  ? ?MRN: [id number] ? ?Screening run in ? ?CORE Abnormal Lab report 22-February-2022 ? ?Chemistry: ?Creatinine   0.55 mg/dL              [] Clinically Significant  [x] Not Clinically Significant ?Glucose 268  mg/dL                    [] Clinically Significant  [x] Not Clinically Significant ?ALT/SGPT 9 U/L                         [] Clinically Significant  [x] Not Clinically Significant ?Hs-C-Reactive Protein 3.4 mg/L  [] Clinically Significant  [x] Not Clinically Significant ?Hemoglobin A1c 11.3                   [x] Clinically Significant  [] Not Clinically Significant ? ?Serology ?Thyroid Stimulating Hormone (TSH)  4.67    [] Clinically Significant  [x] Not Clinically Significant ? ? ?Hematology: ?Neutrophil % 37.5 %               [] Clinically Significant  [x] Not Clinically Significant ?Lymphocyte % 49.6 %            [] Clinically Significant  [x] Not Clinically Significant ? ?Urinalysis: ?Glucose  >1000  mg/dL            [] Clinically Significant  [x] Not Clinically Significant ?Ketone 10 mg/dL                      [] Clinically Significant  [x] Not Clinically Significant ?Specific Gravity  1.039             [] Clinically Significant  [x] Not Clinically Significant ?Protein 600 mg/dL                    [] Clinically Significant  [x] Not Clinically Significant ?Uric Acid Crystals Present        [] Clinically Significant  [x] Not Clinically Significant ?Amorphous Present                  [] Clinically Significant  [x] Not Clinically Significant ?Mucus 1+                                  [] Clinically Significant  [x] Not Clinically Significant ? ?Urine Chemistry: ?Urine Protein 605 mg/dL                    [] Clinically Significant  [x] Not Clinically Significant ?Urine Albumin  482.00 mg/dL             [] Clinically Significant  [x] Not Clinically Significant ?Protein Creatinine Ratio  2782 mg/g   [x] Clinically Significant  [] Not Clinically Significant ? ?Lipids:  ?Apolipoprotein Clll  74.96 mg/dL     [] Clinically Significant  [x] Not Clinically Significant ?Apolipoprotein B  115.00 mg/dL     [] Clinically Significant  [x] Not Clinically Significant ?Apolipoprotein E  24.9 mg/dL         [] Clinically Significant  [x] Not Clinically Significant ? ? ?Any further action needed to be taken per the PI? ? ?Glucose is poorly controlled with A1c that is >11. PCP or endocrinologist who is managing this needs to be notified. ? ? , MD, [facility], FACP  ?[place]  [facility]  ?Medical Director of the Advanced Lipid Disorders &  ?Cardiovascular Risk Reduction Clinic ?Diplomate of the of Clinical Lipidology ?Attending Cardiologist  ?Direct Dial: [phone]  Fax: [phone]  ?Website:  [email or web address] ? ? ? ?

## 2022-02-24 NOTE — Research (Unsigned)
Spoke with Carollee Leitz informed him that he will not be able to be in the Core study due to his A1c being 11.3 and Urine protein / creatinine being > 500.  Voice understanding. ?Message sent to [facility], FNP to inform her of above labs. ?

## 2022-02-28 NOTE — Research (Addendum)
? ? ?  Labs for [employer] ?MRN#  [id number] ?Screening run in visit ? ?Lipids:  ?Triglyceride   2723 mg/dL           [x] Clinically Significant  [] Not Clinically Significant ?Total Cholesterol 455                 [x] Clinically Significant  [] Not Clinically Significant ?HDL-Cholesterol (ppt) 18 mg/dL  [] Clinically Significant  [x] Not Clinically Significant ?Non-HDL Cholesterol (calc) 437 mg/dL  [x] Clinically Significant  [] Not Clinically Significant ? ? ? ?Any further action needed to be taken per the PI?  NO ? ?Significantly elevated TG and cholesterol - as expected for the study. ? ? , MD, [facility], FACP  ?[place]  [facility]  ?Medical Director of the Advanced Lipid Disorders &  ?Cardiovascular Risk Reduction Clinic ?Diplomate of the of Clinical Lipidology ?Attending Cardiologist  ?Direct Dial: [phone]  Fax: [phone]  ?Website:  [email or web address] ? ? ?

## 2022-03-01 NOTE — Research (Signed)
? ?  Labs for screening run in  ?[employer] ?MRN [id number]  ?

## 2022-04-07 ENCOUNTER — Ambulatory Visit: Payer: Medicaid Other | Admitting: Nurse Practitioner

## 2022-04-07 ENCOUNTER — Encounter: Payer: Self-pay | Admitting: Nurse Practitioner

## 2022-04-07 VITALS — BP 130/78 | HR 94 | Temp 98.3°F | Resp 20 | Ht 65.0 in | Wt 214.0 lb

## 2022-04-07 DIAGNOSIS — E108 Type 1 diabetes mellitus with unspecified complications: Secondary | ICD-10-CM | POA: Diagnosis not present

## 2022-04-07 DIAGNOSIS — E785 Hyperlipidemia, unspecified: Secondary | ICD-10-CM

## 2022-04-07 DIAGNOSIS — Z6837 Body mass index (BMI) 37.0-37.9, adult: Secondary | ICD-10-CM | POA: Diagnosis not present

## 2022-04-07 DIAGNOSIS — I10 Essential (primary) hypertension: Secondary | ICD-10-CM

## 2022-04-07 DIAGNOSIS — R6250 Unspecified lack of expected normal physiological development in childhood: Secondary | ICD-10-CM

## 2022-04-07 DIAGNOSIS — F900 Attention-deficit hyperactivity disorder, predominantly inattentive type: Secondary | ICD-10-CM | POA: Diagnosis not present

## 2022-04-07 DIAGNOSIS — E876 Hypokalemia: Secondary | ICD-10-CM | POA: Diagnosis not present

## 2022-04-07 DIAGNOSIS — E1169 Type 2 diabetes mellitus with other specified complication: Secondary | ICD-10-CM

## 2022-04-07 LAB — BAYER DCA HB A1C WAIVED: HB A1C (BAYER DCA - WAIVED): 11.5 % — ABNORMAL HIGH (ref 4.8–5.6)

## 2022-04-07 MED ORDER — LISDEXAMFETAMINE DIMESYLATE 50 MG PO CAPS: 50.0000 mg | ORAL_CAPSULE | Freq: Every day | ORAL | 0 refills | Status: DC

## 2022-04-07 MED ORDER — BLOOD GLUCOSE METER KIT: PACK | 0 refills | Status: DC

## 2022-04-07 MED ORDER — LANTUS SOLOSTAR 100 UNIT/ML ~~LOC~~ SOPN: 64.0000 [IU] | PEN_INJECTOR | SUBCUTANEOUS | 3 refills | Status: DC

## 2022-04-07 NOTE — Progress Notes (Signed)
? ?Subjective:  ? ? Patient ID: Stephen Richardson, male    DOB: October 23, 1996, 26 y.o.   MRN: [id number] ? ? ?Chief Complaint: Medical Management of Chronic Issues ?  ? ?HPI: ? ?Nathanyel Defenbaugh is a 26 y.o. who identifies as a male who was assigned male at birth.  ? ?Social history: ?Lives with: his mom ?Work history: not able to work ? ? ?Comes in today for follow up of the following chronic medical issues: ? ?1. Type 1 diabetes mellitus with complications (HCC) ?He has not been checking his blood sugars at home. Needs a new monitor.he does not really wathc his diet but he has been walking daily. ?Lab Results  ?Component Value Date  ? HGBA1C 7.3 (H) 12/30/2021  ? ? ? ?2. Essential hypertension ?No c/o chest pain, sob or headache. Does not check his blood sugars at home. ?BP Readings from Last 3 Encounters:  ?04/07/22 130/78  ?02/22/22 140/73  ?12/30/21 129/78  ? ? ? ?3. Hyperlipidemia associated with type 2 diabetes mellitus ([place]) ?Does not watch diet , but is walking daily for exercise. ?Lab Results  ?Component Value Date  ? CHOL 324 (H) 12/30/2021  ? HDL 25 (L) 12/30/2021  ? [place] Comment (A) 12/30/2021  ? LDLDIRECT 106 (H) 01/04/2021  ? TRIG 1,536 ([place]) 12/30/2021  ? CHOLHDL 13.0 (H) 12/30/2021  ? ? ? ?4. Hypokalemia ?Is on daly potassium supplement. Denies any muscle cramping. ?Lab Results  ?Component Value Date  ? K 4.4 12/30/2021  ? ? ? ?5. Developmental delay ?He is not able to live on his own or hold down a job. ? ?6. Attention deficit hyperactivity disorder (ADHD), predominantly inattentive type ?He is on vyvanse daily andis doing well. Cannot stya calm without medication. No medication side effects. ? ?7. BMI 37.0-37.9, adult ?Weight is down 8lbs ?Wt Readings from Last 3 Encounters:  ?04/07/22 214 lb (97.1 kg)  ?02/22/22 222 lb 6.4 oz (100.9 kg)  ?12/30/21 232 lb (105.2 kg)  ? ?BMI Readings from Last 3 Encounters:  ?04/07/22 35.61 kg/m?  ?02/22/22 37.01 kg/m?  ?12/30/21 38.61 kg/m?  ? ? ? ?New complaints: ?None  today ? ?No Known Allergies ?Outpatient Encounter Medications as of 04/07/2022  ?Medication Sig  ? Accu-Chek FastClix Lancets MISC Test BS QID Dx E10.8  ? atorvastatin (LIPITOR) 80 MG tablet Take 1 tablet (80 mg total) by mouth daily.  ? B-D UF III MINI PEN NEEDLES 31G X 5 MM MISC USE WITH INSULIN DAILY DX E10.8  ? blood glucose meter kit and supplies Dispense based on patient and insurance preference. Use up to four times daily as directed. (FOR ICD-10 E10.9, E11.9).  ? Blood Glucose Monitoring Suppl (ACCU-CHEK GUIDE) w/Device KIT 1 kit by Other route 4 (four) times daily as needed. Dx E11.9  ? Continuous Blood Gluc Receiver (FREESTYLE LIBRE 2 READER) DEVI Use to test blood sugar 6 times daily as directed.  ? Continuous Blood Gluc Sensor (FREESTYLE LIBRE 2 SENSOR) MISC Use to test blood sugar 6 times daily as directed.  ? fenofibrate (TRICOR) 145 MG tablet Take 1 tablet (145 mg total) by mouth daily.  ? glucose blood (FREESTYLE PRECISION NEO TEST) test strip Use as instructed to test blood sugar 6 times daily as directed.  Use test strip with freestyle libre 2 meter  ? icosapent Ethyl (VASCEPA) 1 g capsule Take 2 capsules (2 g total) by mouth 2 (two) times daily.  ? insulin glargine (LANTUS SOLOSTAR) 100 UNIT/ML Solostar Pen Inject 58 Units into  the skin every morning.  ? insulin lispro (HUMALOG KWIKPEN) 100 UNIT/ML KwikPen Inject 12 Units into the skin 3 (three) times daily with meals.  ? lisinopril (ZESTRIL) 5 MG tablet Take 1 tablet (5 mg total) by mouth daily.  ? metFORMIN (GLUCOPHAGE) 500 MG tablet Take 1 tablet (500 mg total) by mouth 2 (two) times daily with a meal.  ? potassium chloride (KLOR-CON) 10 MEQ tablet Take 1 tablet (10 mEq total) by mouth 2 (two) times daily.  ? lisdexamfetamine (VYVANSE) 50 MG capsule Take 1 capsule (50 mg total) by mouth daily.  ? lisdexamfetamine (VYVANSE) 50 MG capsule Take 1 capsule (50 mg total) by mouth daily.  ? lisdexamfetamine (VYVANSE) 50 MG capsule Take 1 capsule (50 mg  total) by mouth daily.  ? ?No facility-administered encounter medications on file as of 04/07/2022.  ? ? ?History reviewed. No pertinent surgical history. ? ?Family History  ?Problem Relation Age of Onset  ? Kidney disease Father   ? Diabetes Father   ? Pancreatitis Father   ? ? ? ? ?Controlled substance contract: n/a ? ? ? ? ?Review of Systems  ?Constitutional:  Negative for diaphoresis.  ?Eyes:  Negative for pain.  ?Respiratory:  Negative for shortness of breath.   ?Cardiovascular:  Negative for chest pain, palpitations and leg swelling.  ?Gastrointestinal:  Negative for abdominal pain.  ?Endocrine: Negative for polydipsia.  ?Skin:  Negative for rash.  ?Neurological:  Negative for dizziness, weakness and headaches.  ?Hematological:  Does not bruise/bleed easily.  ?All other systems reviewed and are negative. ? ?   ?Objective:  ? Physical Exam ?Vitals and nursing note reviewed.  ?Constitutional:   ?   Appearance: Normal appearance. He is well-developed.  ?HENT:  ?   Head: Normocephalic.  ?   Nose: Nose normal.  ?   Mouth/Throat:  ?   Mouth: Mucous membranes are moist.  ?   Pharynx: Oropharynx is clear.  ?Eyes:  ?   Pupils: Pupils are equal, round, and reactive to light.  ?Neck:  ?   Thyroid: No thyroid mass or thyromegaly.  ?   Vascular: No carotid bruit or JVD.  ?   Trachea: Phonation normal.  ?Cardiovascular:  ?   Rate and Rhythm: Normal rate and regular rhythm.  ?Pulmonary:  ?   Effort: Pulmonary effort is normal. No respiratory distress.  ?   Breath sounds: Normal breath sounds.  ?Abdominal:  ?   General: Bowel sounds are normal.  ?   Palpations: Abdomen is soft.  ?   Tenderness: There is no abdominal tenderness.  ?Musculoskeletal:     ?   General: Normal range of motion.  ?   Cervical back: Normal range of motion and neck supple.  ?Lymphadenopathy:  ?   Cervical: No cervical adenopathy.  ?Skin: ?   General: Skin is warm and dry.  ?Neurological:  ?   Mental Status: He is alert and oriented to person, place, and  time.  ?Psychiatric:     ?   Behavior: Behavior normal.     ?   Thought Content: Thought content normal.     ?   Judgment: Judgment normal.  ? ? ?BP 130/78   Pulse 94   Temp 98.3 ?F (36.8 ?C) (Temporal)   Resp 20   Ht $R'5\' 5"'mc$  (1.651 m)   Wt 214 lb (97.1 kg)   SpO2 95%   BMI 35.61 kg/m?  ? ?HGBA1c 11.5% ? ? ?   ?Assessment & Plan:  ?Hodari  Lorey comes in today with chief complaint of Medical Management of Chronic Issues ? ? ?Diagnosis and orders addressed: ? ?1. Type 1 diabetes mellitus with complications (HCC) ?Increase lantus to 64u qhs ?Will meet with clinical pharmacist to discuss changing sliding scale ?- Bayer DCA Hb A1c Waived ?- insulin glargine (LANTUS SOLOSTAR) 100 UNIT/ML Solostar Pen; Inject 64 Units into the skin every morning.  Dispense: 30 mL; Refill: 3 ? ?2. Essential hypertension ?Low sodium diet ?- CBC with Differential/Platelet ?- CMP14+EGFR ? ?3. Hyperlipidemia associated with type 2 diabetes mellitus ([place]) ?Low fat diet ?- Lipid panel ? ?4. Hypokalemia ?Labs pending ? ?5. Developmental delay ? ?6. Attention deficit hyperactivity disorder (ADHD), predominantly inattentive type ?Stress management ?- lisdexamfetamine (VYVANSE) 50 MG capsule; Take 1 capsule (50 mg total) by mouth daily.  Dispense: 30 capsule; Refill: 0 ?- lisdexamfetamine (VYVANSE) 50 MG capsule; Take 1 capsule (50 mg total) by mouth daily.  Dispense: 30 capsule; Refill: 0 ?- lisdexamfetamine (VYVANSE) 50 MG capsule; Take 1 capsule (50 mg total) by mouth daily.  Dispense: 30 capsule; Refill: 0 ? ?7. BMI 37.0-37.9, adult ?Discussed diet and exercise for person with BMI >25 ?Will recheck weight in 3-6 months ? ? ? ?Labs pending ?Health Maintenance reviewed ?Diet and exercise encouraged ? ?Follow up plan: ?1 month ? ? ?Mary-Margaret Hassell Done, FNP ? ? ?

## 2022-04-07 NOTE — Patient Instructions (Signed)

## 2022-04-08 LAB — CBC WITH DIFFERENTIAL/PLATELET
Basophils Absolute: 0.1 10*3/uL (ref 0.0–0.2)
Basos: 1 %
EOS (ABSOLUTE): 0.1 10*3/uL (ref 0.0–0.4)
Eos: 1 %
Hematocrit: 44.5 % (ref 37.5–51.0)
Hemoglobin: 15 g/dL (ref 13.0–17.7)
Immature Grans (Abs): 0 10*3/uL (ref 0.0–0.1)
Immature Granulocytes: 0 %
Lymphocytes Absolute: 2.2 10*3/uL (ref 0.7–3.1)
Lymphs: 41 %
MCH: 29.8 pg (ref 26.6–33.0)
MCHC: 33.7 g/dL (ref 31.5–35.7)
MCV: 89 fL (ref 79–97)
Monocytes Absolute: 0.4 10*3/uL (ref 0.1–0.9)
Monocytes: 7 %
Neutrophils Absolute: 2.7 10*3/uL (ref 1.4–7.0)
Neutrophils: 50 %
Platelets: 273 10*3/uL (ref 150–450)
RBC: 5.03 x10E6/uL (ref 4.14–5.80)
RDW: 13 % (ref 11.6–15.4)
WBC: 5.3 10*3/uL (ref 3.4–10.8)

## 2022-04-08 LAB — LIPID PANEL
Chol/HDL Ratio: 8.2 ratio — ABNORMAL HIGH (ref 0.0–5.0)
Cholesterol, Total: 246 mg/dL — ABNORMAL HIGH (ref 100–199)
HDL: 30 mg/dL — ABNORMAL LOW (ref >39)
Triglycerides: 876 mg/dL (ref 0–149)

## 2022-04-08 LAB — CMP14+EGFR
ALT: 8 IU/L (ref 0–44)
AST: 11 IU/L (ref 0–40)
Albumin/Globulin Ratio: 2.1 (ref 1.2–2.2)
Albumin: 4.4 g/dL (ref 4.1–5.2)
Alkaline Phosphatase: 67 IU/L (ref 44–121)
BUN/Creatinine Ratio: 28 — ABNORMAL HIGH (ref 9–20)
BUN: 14 mg/dL (ref 6–20)
Bilirubin Total: 0.4 mg/dL (ref 0.0–1.2)
CO2: 19 mmol/L — ABNORMAL LOW (ref 20–29)
Calcium: 9.3 mg/dL (ref 8.7–10.2)
Chloride: 97 mmol/L (ref 96–106)
Creatinine, Ser: 0.5 mg/dL — ABNORMAL LOW (ref 0.76–1.27)
Globulin, Total: 2.1 g/dL (ref 1.5–4.5)
Glucose: 279 mg/dL — ABNORMAL HIGH (ref 70–99)
Potassium: 4.4 mmol/L (ref 3.5–5.2)
Sodium: 135 mmol/L (ref 134–144)
Total Protein: 6.5 g/dL (ref 6.0–8.5)
eGFR: 146 mL/min/{1.73_m2} (ref >59)

## 2022-04-14 ENCOUNTER — Ambulatory Visit (INDEPENDENT_AMBULATORY_CARE_PROVIDER_SITE_OTHER): Payer: Medicaid Other | Admitting: Pharmacist

## 2022-04-14 DIAGNOSIS — E108 Type 1 diabetes mellitus with unspecified complications: Secondary | ICD-10-CM

## 2022-04-14 MED ORDER — FREESTYLE LIBRE 2 SENSOR MISC: 11 refills | Status: DC

## 2022-04-14 MED ORDER — FREESTYLE LIBRE 3 SENSOR MISC: 11 refills | Status: DC

## 2022-04-14 MED ORDER — LANTUS SOLOSTAR 100 UNIT/ML ~~LOC~~ SOPN: 65.0000 [IU] | PEN_INJECTOR | SUBCUTANEOUS | 3 refills | Status: DC

## 2022-04-14 MED ORDER — FREESTYLE LIBRE 2 READER DEVI: 0 refills | Status: DC

## 2022-04-14 MED ORDER — INSULIN LISPRO (1 UNIT DIAL) 100 UNIT/ML (KWIKPEN): 12.0000 [IU] | PEN_INJECTOR | Freq: Three times a day (TID) | SUBCUTANEOUS | 11 refills | Status: DC

## 2022-04-14 NOTE — Progress Notes (Addendum)
? ? ?  04/14/2022 ?Name: Stephen Richardson MRN: [id number] DOB: 12-Aug-1996 ? ? ?S:  25 Yom Presents for diabetes evaluation, education, and management.  Patient was referred and last seen by Primary Care Provider on 04/07/22.  Patient's a1c was very controlled, however increased to 11.6% at most recent PCP visit.  Patient states he is eating more now and his sugar has gone up.  He also reports he does not do as well without having his libre 2 CGM.  He states insurance denied it most recently ? ?Insurance coverage/medication affordability: MEDICAID ? ?Patient reports adherence with medications. ?Current diabetes medications include: lantus, humalog, metformin ?  ?Patient denies hypoglycemic events. ?  ?Patient reported dietary habits: Eats 2-3 meals/day ? ?Breakfast: toast ? ?Lunch:sometimes he skips, salads ? ?Dinner: pizza ? ?Snacks:cookies, sweets ? ?Drinks:reports diet soda, juices ? ?Discussed meal planning options and Plate method for healthy eating ?Avoid sugary drinks and desserts ?Incorporate balanced protein, non starchy veggies, 1 serving of carbohydrate with each meal ?Increase water intake ?Increase physical activity as able ? ? ?Patient-reported exercise habits: walks ?  ? ?O: ? ?Lab Results  ?Component Value Date  ? HGBA1C 11.5 (H) 04/07/2022  ? ?Lipid Panel ? ?   ?Component Value Date/Time  ? CHOL 246 (H) 04/07/2022 1141  ? TRIG 876 (HH) 04/07/2022 1141  ? HDL 30 (L) 04/07/2022 1141  ? CHOLHDL 8.2 (H) 04/07/2022 1141  ? LDLCALC Comment (A) 04/07/2022 1141  ? LDLDIRECT 106 (H) 01/04/2021 1418  ? ? Home fasting blood sugars: 190-200 ? ?2 hour post-meal/random blood sugars: 200-300. ? ? ?A/P: ? ?Diabetes T1DM currently uncontrolled.  Patient would like to get started back on libre 2 CGM (unable to use phone as reader, but has reader at home).  Libre 2 sample given today ?Patient is  able to verbalize appropriate hypoglycemia management plan. ? ?-Continue lantus 65 units nightly ? ?-change Humalog 3x daily meal  time insulin per sliding scale below ?Sliding Scale for Stephen Richardson (DOB: March 16, 1996)  ?Check blood glucose before each meal.  Inject Humalog per chart below.  ?             ?Blood Sugar/Glucose Reading   How much insulin to give    ?150 - 250  12 units    ?250 - 350  16 units    ?Over 350  20 units    ? ? ?Continue Lantus 64 units daily ?Call office if having 2 or more readings over 250 or less than 70 within 1 week ?[facility] [phone] ? ?-continue metformin ? ?-Extensively discussed pathophysiology of diabetes, recommended lifestyle interventions, dietary effects on blood sugar control ? ?-Counseled on s/sx of and management of hypoglycemia ?  ?Written patient instructions provided.  Total time in face to face counseling 30 minutes.  ? ?Follow up Pharmacist/PCP Clinic Visit ON 04/28/22.  ? ? ?Stephen Richardson, PharmD, BCPS ?Clinical Pharmacist, [facility] ?[place]  [facility] Phone [phone] ? ? ?

## 2022-04-15 ENCOUNTER — Telehealth: Payer: Self-pay | Admitting: Pharmacist

## 2022-04-15 NOTE — Telephone Encounter (Signed)
PA pending for libre 2 cgm ?Clinical notes were attached ? ?

## 2022-04-15 NOTE — Telephone Encounter (Signed)
Your request has been approved ?PA Case: [id number], Status: Approved, Coverage Starts on: 04/15/2022 12:00:00 AM, Coverage Ends on: 04/15/2023 12:00:00 AM. ? ?Pharmacy aware. ?

## 2022-04-28 ENCOUNTER — Encounter: Payer: Self-pay | Admitting: Nurse Practitioner

## 2022-04-28 ENCOUNTER — Ambulatory Visit: Payer: Medicaid Other | Admitting: Nurse Practitioner

## 2022-04-28 VITALS — BP 127/73 | HR 83 | Temp 98.3°F | Resp 20 | Ht 65.0 in | Wt 215.0 lb

## 2022-04-28 DIAGNOSIS — E108 Type 1 diabetes mellitus with unspecified complications: Secondary | ICD-10-CM | POA: Diagnosis not present

## 2022-04-28 LAB — BAYER DCA HB A1C WAIVED: HB A1C (BAYER DCA - WAIVED): 9.7 % — ABNORMAL HIGH (ref 4.8–5.6)

## 2022-04-28 NOTE — Progress Notes (Signed)
Subjective:    Patient ID: Stephen Richardson, male    DOB: 11/07/1996, 26 y.o.   MRN: [id number]   Chief Complaint: Diabetes    HPI:  Stephen Richardson is a 26 y.o. who identifies as a male who was assigned male at birth.   Social history: Lives with: mom Work history: disability   Comes in today for follow up of the following chronic medical issues:  1. Type 1 diabetes mellitus with complications ([place]) Patient was seen on 04/07/22 and he had not been watching his diet n or checking his blood sugars. His HGBA1c was 11.5%. We made him an appointment with clinical pharmacist. She got him back on libre and increased his sliding scale. His blood sugars have been running 140-180. He has been trying to wathc his diet and has been exercising.  New complaints: None today  No Known Allergies Outpatient Encounter Medications as of 04/28/2022  Medication Sig   Accu-Chek FastClix Lancets MISC Test BS QID Dx E10.8   atorvastatin (LIPITOR) 80 MG tablet Take 1 tablet (80 mg total) by mouth daily.   B-D UF III MINI PEN NEEDLES 31G X 5 MM MISC USE WITH INSULIN DAILY DX E10.8   blood glucose meter kit and supplies Dispense based on patient and insurance preference. Use up to four times daily as directed. (FOR ICD-10 E10.9, E11.9).   blood glucose meter kit and supplies Dispense based on patient and insurance preference. Use up to four times daily as directed. (FOR ICD-10 E10.9, E11.9).   Blood Glucose Monitoring Suppl (ACCU-CHEK GUIDE) w/Device KIT 1 kit by Other route 4 (four) times daily as needed. Dx E11.9   Continuous Blood Gluc Receiver (FREESTYLE LIBRE 2 READER) DEVI Use to scan blood sugar continuously. DX: E10.8   Continuous Blood Gluc Sensor (FREESTYLE LIBRE 2 SENSOR) MISC Place 1 sensor on arm every 14 days. Use to test blood sugar continuously. DX: E10.8   fenofibrate (TRICOR) 145 MG tablet Take 1 tablet (145 mg total) by mouth daily.   glucose blood (FREESTYLE PRECISION NEO TEST) test strip  Use as instructed to test blood sugar 6 times daily as directed.  Use test strip with freestyle libre 2 meter   icosapent Ethyl (VASCEPA) 1 g capsule Take 2 capsules (2 g total) by mouth 2 (two) times daily.   insulin glargine (LANTUS SOLOSTAR) 100 UNIT/ML Solostar Pen Inject 65 Units into the skin every morning.   insulin lispro (HUMALOG KWIKPEN) 100 UNIT/ML KwikPen Inject 12-20 Units into the skin 3 (three) times daily with meals. Per sliding scale   [START ON 06/06/2022] lisdexamfetamine (VYVANSE) 50 MG capsule Take 1 capsule (50 mg total) by mouth daily.   [START ON 05/07/2022] lisdexamfetamine (VYVANSE) 50 MG capsule Take 1 capsule (50 mg total) by mouth daily.   lisdexamfetamine (VYVANSE) 50 MG capsule Take 1 capsule (50 mg total) by mouth daily.   lisinopril (ZESTRIL) 5 MG tablet Take 1 tablet (5 mg total) by mouth daily.   metFORMIN (GLUCOPHAGE) 500 MG tablet Take 1 tablet (500 mg total) by mouth 2 (two) times daily with a meal.   potassium chloride (KLOR-CON) 10 MEQ tablet Take 1 tablet (10 mEq total) by mouth 2 (two) times daily.   No facility-administered encounter medications on file as of 04/28/2022.    History reviewed. No pertinent surgical history.  Family History  Problem Relation Age of Onset   Kidney disease Father    Diabetes Father    Pancreatitis Father  Controlled substance contract: n/a     Review of Systems  Constitutional:  Negative for diaphoresis.  Eyes:  Negative for pain.  Respiratory:  Negative for shortness of breath.   Cardiovascular:  Negative for chest pain, palpitations and leg swelling.  Gastrointestinal:  Negative for abdominal pain.  Endocrine: Negative for polydipsia.  Skin:  Negative for rash.  Neurological:  Negative for dizziness, weakness and headaches.  Hematological:  Does not bruise/bleed easily.  All other systems reviewed and are negative.     Objective:   Physical Exam Vitals and nursing note reviewed.  Constitutional:       Appearance: Normal appearance. He is well-developed.  HENT:     Head: Normocephalic.     Nose: Nose normal.     Mouth/Throat:     Mouth: Mucous membranes are moist.     Pharynx: Oropharynx is clear.  Eyes:     Pupils: Pupils are equal, round, and reactive to light.  Neck:     Thyroid: No thyroid mass or thyromegaly.     Vascular: No carotid bruit or JVD.     Trachea: Phonation normal.  Cardiovascular:     Rate and Rhythm: Normal rate and regular rhythm.  Pulmonary:     Effort: Pulmonary effort is normal. No respiratory distress.     Breath sounds: Normal breath sounds.  Abdominal:     General: Bowel sounds are normal.     Palpations: Abdomen is soft.     Tenderness: There is no abdominal tenderness.  Musculoskeletal:        General: Normal range of motion.     Cervical back: Normal range of motion and neck supple.  Lymphadenopathy:     Cervical: No cervical adenopathy.  Skin:    General: Skin is warm and dry.  Neurological:     Mental Status: He is alert and oriented to person, place, and time.  Psychiatric:        Behavior: Behavior normal.        Thought Content: Thought content normal.        Judgment: Judgment normal.   BP 127/73   Pulse 83   Temp 98.3 F (36.8 C) (Temporal)   Resp 20   Ht 5' 5"  (1.651 m)   Wt 215 lb (97.5 kg)   SpO2 94%   BMI 35.78 kg/m   Hgba1c-9.7%      Assessment & Plan:  Stephen Richardson in today with chief complaint of Diabetes   1. Type 1 diabetes mellitus with complications (HCC) Continue current sliding scale Continue lantus at 64u qhs Watch carbs in diet - Bayer DCA Hb A1c Waived  Recheckin 2 months  The above assessment and management plan was discussed with the patient. The patient verbalized understanding of and has agreed to the management plan. Patient is aware to call the clinic if symptoms persist or worsen. Patient is aware when to return to the clinic for a follow-up visit. Patient educated on when it is  appropriate to go to the emergency department.   Mary-Margaret Hassell Done, FNP

## 2022-04-28 NOTE — Patient Instructions (Signed)

## 2022-06-06 ENCOUNTER — Other Ambulatory Visit: Payer: Self-pay | Admitting: Nurse Practitioner

## 2022-06-11 ENCOUNTER — Other Ambulatory Visit: Payer: Self-pay | Admitting: Nurse Practitioner

## 2022-06-11 DIAGNOSIS — E876 Hypokalemia: Secondary | ICD-10-CM

## 2022-06-24 ENCOUNTER — Other Ambulatory Visit: Payer: Self-pay | Admitting: Nurse Practitioner

## 2022-06-24 DIAGNOSIS — I10 Essential (primary) hypertension: Secondary | ICD-10-CM

## 2022-06-28 ENCOUNTER — Ambulatory Visit (INDEPENDENT_AMBULATORY_CARE_PROVIDER_SITE_OTHER): Payer: Medicaid Other | Admitting: Nurse Practitioner

## 2022-06-28 ENCOUNTER — Encounter: Payer: Self-pay | Admitting: Nurse Practitioner

## 2022-06-28 VITALS — BP 132/85 | HR 78 | Temp 98.7°F | Resp 20 | Ht 65.0 in | Wt 228.0 lb

## 2022-06-28 DIAGNOSIS — E108 Type 1 diabetes mellitus with unspecified complications: Secondary | ICD-10-CM

## 2022-06-28 DIAGNOSIS — E1169 Type 2 diabetes mellitus with other specified complication: Secondary | ICD-10-CM

## 2022-06-28 DIAGNOSIS — E785 Hyperlipidemia, unspecified: Secondary | ICD-10-CM

## 2022-06-28 DIAGNOSIS — Q231 Congenital insufficiency of aortic valve: Secondary | ICD-10-CM

## 2022-06-28 DIAGNOSIS — I10 Essential (primary) hypertension: Secondary | ICD-10-CM | POA: Diagnosis not present

## 2022-06-28 DIAGNOSIS — E876 Hypokalemia: Secondary | ICD-10-CM

## 2022-06-28 DIAGNOSIS — R6250 Unspecified lack of expected normal physiological development in childhood: Secondary | ICD-10-CM

## 2022-06-28 DIAGNOSIS — F900 Attention-deficit hyperactivity disorder, predominantly inattentive type: Secondary | ICD-10-CM

## 2022-06-28 DIAGNOSIS — Z6837 Body mass index (BMI) 37.0-37.9, adult: Secondary | ICD-10-CM

## 2022-06-28 LAB — BAYER DCA HB A1C WAIVED: HB A1C (BAYER DCA - WAIVED): 8.4 % — ABNORMAL HIGH (ref 4.8–5.6)

## 2022-06-28 MED ORDER — FENOFIBRATE 145 MG PO TABS: 145.0000 mg | ORAL_TABLET | Freq: Every day | ORAL | 1 refills | Status: DC

## 2022-06-28 MED ORDER — LISDEXAMFETAMINE DIMESYLATE 50 MG PO CAPS: 50.0000 mg | ORAL_CAPSULE | Freq: Every day | ORAL | 0 refills | Status: DC

## 2022-06-28 MED ORDER — POTASSIUM CHLORIDE ER 10 MEQ PO TBCR: 10.0000 meq | EXTENDED_RELEASE_TABLET | Freq: Two times a day (BID) | ORAL | 1 refills | Status: DC

## 2022-06-28 MED ORDER — LANTUS SOLOSTAR 100 UNIT/ML ~~LOC~~ SOPN
70.0000 [IU] | PEN_INJECTOR | SUBCUTANEOUS | 3 refills | Status: DC
Start: 2022-06-28 — End: 2022-08-12

## 2022-06-28 MED ORDER — INSULIN LISPRO (1 UNIT DIAL) 100 UNIT/ML (KWIKPEN): 12.0000 [IU] | PEN_INJECTOR | Freq: Three times a day (TID) | SUBCUTANEOUS | 11 refills | Status: DC

## 2022-06-28 MED ORDER — ICOSAPENT ETHYL 1 G PO CAPS: 2.0000 g | ORAL_CAPSULE | Freq: Two times a day (BID) | ORAL | 1 refills | Status: DC

## 2022-06-28 MED ORDER — METFORMIN HCL 500 MG PO TABS: 500.0000 mg | ORAL_TABLET | Freq: Two times a day (BID) | ORAL | 3 refills | Status: DC

## 2022-06-28 MED ORDER — ATORVASTATIN CALCIUM 80 MG PO TABS: 80.0000 mg | ORAL_TABLET | Freq: Every day | ORAL | 1 refills | Status: DC

## 2022-06-28 NOTE — Progress Notes (Signed)
Subjective:    Patient ID: Stephen Richardson, male    DOB: 08/10/1996, 26 y.o.   MRN: [id number]   Chief Complaint: Medical Management of Chronic Issues    HPI:  Stephen Richardson is a 26 y.o. who identifies as a male who was assigned male at birth.   Social history: Lives with: mom Work history: not able to work due to [place] in today for follow up of the following chronic medical issues:  1. Type 1 diabetes mellitus with complications (HCC) Fasting blood sugars are running a little high above 150. No low blood sugars. Lab Results  Component Value Date   HGBA1C 9.7 (H) 04/28/2022     2. Essential hypertension No c/o chest pain, sob or headache. Does not check blood sugars at home. BP Readings from Last 3 Encounters:  06/28/22 132/85  04/28/22 127/73  04/07/22 130/78     3. Hyperlipidemia associated with type 2 diabetes mellitus ([place]) Does not really watch diet and does not do much exercise. Is on lipitor and vascepa daily Lab Results  Component Value Date   CHOL 246 (H) 04/07/2022   HDL 30 (L) 04/07/2022   LDLCALC Comment (A) 04/07/2022   LDLDIRECT 106 (H) 01/04/2021   TRIG 876 (HH) 04/07/2022   CHOLHDL 8.2 (H) 04/07/2022     4. Congenital aortic valve insufficiency 5. Bicuspid aortic valve Sees cardiology every 6 months. He is currently in a research study and they reach out to him frequently.  6. Hypokalemia No c/o muscle cramps  7. Developmental delay Unable to live on his own and handle his own finances  8. Attention deficit hyperactivity disorder (ADHD), predominantly inattentive type Is on vyvnase daily to keep him calm. He is very hyperactive without medication.  9. BMI 37.0-37.9, adult No recent weight changes Wt Readings from Last 3 Encounters:  06/28/22 228 lb (103.4 kg)  04/28/22 215 lb (97.5 kg)  04/07/22 214 lb (97.1 kg)   BMI Readings from Last 3 Encounters:  06/28/22 37.94 kg/m  04/28/22 35.78 kg/m  04/07/22 35.61 kg/m       New complaints: None today  No Known Allergies Outpatient Encounter Medications as of 06/28/2022  Medication Sig   Accu-Chek FastClix Lancets MISC Test BS QID Dx E10.8   ACCU-CHEK GUIDE test strip TEST UP TO 4 TIMES DAILY AS DIRECTED   atorvastatin (LIPITOR) 80 MG tablet Take 1 tablet (80 mg total) by mouth daily.   B-D UF III MINI PEN NEEDLES 31G X 5 MM MISC USE WITH INSULIN DAILY DX E10.8   blood glucose meter kit and supplies Dispense based on patient and insurance preference. Use up to four times daily as directed. (FOR ICD-10 E10.9, E11.9).   blood glucose meter kit and supplies Dispense based on patient and insurance preference. Use up to four times daily as directed. (FOR ICD-10 E10.9, E11.9).   Blood Glucose Monitoring Suppl (ACCU-CHEK GUIDE) w/Device KIT 1 kit by Other route 4 (four) times daily as needed. Dx E11.9   Continuous Blood Gluc Receiver (FREESTYLE LIBRE 2 READER) DEVI Use to scan blood sugar continuously. DX: E10.8   Continuous Blood Gluc Sensor (FREESTYLE LIBRE 2 SENSOR) MISC Place 1 sensor on arm every 14 days. Use to test blood sugar continuously. DX: E10.8   fenofibrate (TRICOR) 145 MG tablet Take 1 tablet (145 mg total) by mouth daily.   icosapent Ethyl (VASCEPA) 1 g capsule Take 2 capsules (2 g total) by mouth 2 (two) times daily.  insulin glargine (LANTUS SOLOSTAR) 100 UNIT/ML Solostar Pen Inject 65 Units into the skin every morning.   insulin lispro (HUMALOG KWIKPEN) 100 UNIT/ML KwikPen Inject 12-20 Units into the skin 3 (three) times daily with meals. Per sliding scale   lisdexamfetamine (VYVANSE) 50 MG capsule Take 1 capsule (50 mg total) by mouth daily.   lisinopril (ZESTRIL) 5 MG tablet TAKE 1 TABLET (5 MG TOTAL) BY MOUTH DAILY.   metFORMIN (GLUCOPHAGE) 500 MG tablet Take 1 tablet (500 mg total) by mouth 2 (two) times daily with a meal.   potassium chloride (KLOR-CON) 10 MEQ tablet TAKE 1 TABLET BY MOUTH 2 TIMES DAILY.   lisdexamfetamine (VYVANSE) 50  MG capsule Take 1 capsule (50 mg total) by mouth daily.   lisdexamfetamine (VYVANSE) 50 MG capsule Take 1 capsule (50 mg total) by mouth daily.   No facility-administered encounter medications on file as of 06/28/2022.    History reviewed. No pertinent surgical history.  Family History  Problem Relation Age of Onset   Kidney disease Father    Diabetes Father    Pancreatitis Father       Controlled substance contract: n/a     Review of Systems  Constitutional:  Negative for diaphoresis.  Eyes:  Negative for pain.  Respiratory:  Negative for shortness of breath.   Cardiovascular:  Negative for chest pain, palpitations and leg swelling.  Gastrointestinal:  Negative for abdominal pain.  Endocrine: Negative for polydipsia.  Skin:  Negative for rash.  Neurological:  Negative for dizziness, weakness and headaches.  Hematological:  Does not bruise/bleed easily.  All other systems reviewed and are negative.      Objective:   Physical Exam Vitals and nursing note reviewed.  Constitutional:      Appearance: Normal appearance. He is well-developed.  HENT:     Head: Normocephalic.     Nose: Nose normal.     Mouth/Throat:     Mouth: Mucous membranes are moist.     Pharynx: Oropharynx is clear.  Eyes:     Pupils: Pupils are equal, round, and reactive to light.  Neck:     Thyroid: No thyroid mass or thyromegaly.     Vascular: No carotid bruit or JVD.     Trachea: Phonation normal.  Cardiovascular:     Rate and Rhythm: Normal rate and regular rhythm.  Pulmonary:     Effort: Pulmonary effort is normal. No respiratory distress.     Breath sounds: Normal breath sounds.  Abdominal:     General: Bowel sounds are normal.     Palpations: Abdomen is soft.     Tenderness: There is no abdominal tenderness.  Musculoskeletal:        General: Normal range of motion.     Cervical back: Normal range of motion and neck supple.  Lymphadenopathy:     Cervical: No cervical adenopathy.   Skin:    General: Skin is warm and dry.  Neurological:     Mental Status: He is alert and oriented to person, place, and time.  Psychiatric:        Behavior: Behavior normal.        Thought Content: Thought content normal.        Judgment: Judgment normal.     BP 132/85   Pulse 78   Temp 98.7 F (37.1 C) (Temporal)   Resp 20   Ht _0  (1.651 m)   Wt 228 lb (103.4 kg)   SpO2 95%   BMI 37.94 kg/m  HGBA1c 8.4%      Assessment & Plan:   Stephen Richardson comes in today with chief complaint of Medical Management of Chronic Issues   Diagnosis and orders addressed:  1. Type 1 diabetes mellitus with complications (HCC) Low carb diet Increase lantus to 70 units - Bayer DCA Hb A1c Waived - Microalbumin / creatinine urine ratio - insulin glargine (LANTUS SOLOSTAR) 100 UNIT/ML Solostar Pen; Inject 70 Units into the skin every morning.  Dispense: 45 mL; Refill: 3 - insulin lispro (HUMALOG KWIKPEN) 100 UNIT/ML KwikPen; Inject 12-20 Units into the skin 3 (three) times daily with meals. Per sliding scale  Dispense: 45 mL; Refill: 11 - metFORMIN (GLUCOPHAGE) 500 MG tablet; Take 1 tablet (500 mg total) by mouth 2 (two) times daily with a meal.  Dispense: 180 tablet; Refill: 3  2. Essential hypertension Low solidum diet - CBC with Differential/Platelet - CMP14+EGFR  3. Hyperlipidemia associated with type 2 diabetes mellitus ([place]) Watch fats in diet - Lipid panel - atorvastatin (LIPITOR) 80 MG tablet; Take 1 tablet (80 mg total) by mouth daily.  Dispense: 90 tablet; Refill: 1 - icosapent Ethyl (VASCEPA) 1 g capsule; Take 2 capsules (2 g total) by mouth 2 (two) times daily.  Dispense: 360 capsule; Refill: 1 - fenofibrate (TRICOR) 145 MG tablet; Take 1 tablet (145 mg total) by mouth daily.  Dispense: 90 tablet; Refill: 1  4. Congenital aortic valve insufficiency 5. Bicuspid aortic valve Keep follow up with cardioplogy  6. Hypokalemia Labs pending - potassium chloride (KLOR-CON)  10 MEQ tablet; Take 1 tablet (10 mEq total) by mouth 2 (two) times daily.  Dispense: 180 tablet; Refill: 1  7. Developmental delay  8. Attention deficit hyperactivity disorder (ADHD), predominantly inattentive type Stress management - lisdexamfetamine (VYVANSE) 50 MG capsule; Take 1 capsule (50 mg total) by mouth daily.  Dispense: 30 capsule; Refill: 0 - lisdexamfetamine (VYVANSE) 50 MG capsule; Take 1 capsule (50 mg total) by mouth daily.  Dispense: 30 capsule; Refill: 0 - lisdexamfetamine (VYVANSE) 50 MG capsule; Take 1 capsule (50 mg total) by mouth daily.  Dispense: 30 capsule; Refill: 0  9. BMI 37.0-37.9, adult Discussed diet and exercise for person with BMI >25 Will recheck weight in 3-6 months    Labs pending Health Maintenance reviewed Diet and exercise encouraged  Follow up plan: 3 months   Mary-Margaret Hassell Done, FNP

## 2022-06-28 NOTE — Patient Instructions (Addendum)

## 2022-06-29 LAB — LIPID PANEL
Chol/HDL Ratio: 6.6 ratio — ABNORMAL HIGH (ref 0.0–5.0)
Cholesterol, Total: 223 mg/dL — ABNORMAL HIGH (ref 100–199)
HDL: 34 mg/dL — ABNORMAL LOW (ref >39)
LDL Chol Calc (NIH): 105 mg/dL — ABNORMAL HIGH (ref 0–99)
Triglycerides: 488 mg/dL — ABNORMAL HIGH (ref 0–149)
VLDL Cholesterol Cal: 84 mg/dL — ABNORMAL HIGH (ref 5–40)

## 2022-06-29 LAB — CMP14+EGFR
ALT: 12 IU/L (ref 0–44)
AST: 15 IU/L (ref 0–40)
Albumin/Globulin Ratio: 1.8 (ref 1.2–2.2)
Albumin: 4.4 g/dL (ref 4.3–5.2)
Alkaline Phosphatase: 70 IU/L (ref 44–121)
BUN/Creatinine Ratio: 26 — ABNORMAL HIGH (ref 9–20)
BUN: 12 mg/dL (ref 6–20)
Bilirubin Total: 0.4 mg/dL (ref 0.0–1.2)
CO2: 23 mmol/L (ref 20–29)
Calcium: 9.8 mg/dL (ref 8.7–10.2)
Chloride: 100 mmol/L (ref 96–106)
Creatinine, Ser: 0.47 mg/dL — ABNORMAL LOW (ref 0.76–1.27)
Globulin, Total: 2.5 g/dL (ref 1.5–4.5)
Glucose: 140 mg/dL — ABNORMAL HIGH (ref 70–99)
Potassium: 4.4 mmol/L (ref 3.5–5.2)
Sodium: 138 mmol/L (ref 134–144)
Total Protein: 6.9 g/dL (ref 6.0–8.5)
eGFR: 149 mL/min/{1.73_m2} (ref >59)

## 2022-06-29 LAB — CBC WITH DIFFERENTIAL/PLATELET
Basophils Absolute: 0.1 10*3/uL (ref 0.0–0.2)
Basos: 1 %
EOS (ABSOLUTE): 0.1 10*3/uL (ref 0.0–0.4)
Eos: 2 %
Hematocrit: 45.7 % (ref 37.5–51.0)
Hemoglobin: 15.2 g/dL (ref 13.0–17.7)
Immature Grans (Abs): 0 10*3/uL (ref 0.0–0.1)
Immature Granulocytes: 0 %
Lymphocytes Absolute: 2.7 10*3/uL (ref 0.7–3.1)
Lymphs: 35 %
MCH: 29.6 pg (ref 26.6–33.0)
MCHC: 33.3 g/dL (ref 31.5–35.7)
MCV: 89 fL (ref 79–97)
Monocytes Absolute: 0.5 10*3/uL (ref 0.1–0.9)
Monocytes: 6 %
Neutrophils Absolute: 4.3 10*3/uL (ref 1.4–7.0)
Neutrophils: 56 %
Platelets: 299 10*3/uL (ref 150–450)
RBC: 5.13 x10E6/uL (ref 4.14–5.80)
RDW: 12.9 % (ref 11.6–15.4)
WBC: 7.6 10*3/uL (ref 3.4–10.8)

## 2022-06-30 LAB — MICROALBUMIN / CREATININE URINE RATIO
Creatinine, Urine: 184.9 mg/dL
Microalb/Creat Ratio: 736 mg/g creat — ABNORMAL HIGH (ref 0–29)
Microalbumin, Urine: 1360.6 ug/mL

## 2022-07-17 DIAGNOSIS — H5213 Myopia, bilateral: Secondary | ICD-10-CM | POA: Diagnosis not present

## 2022-08-12 ENCOUNTER — Telehealth: Payer: Self-pay | Admitting: Pharmacist

## 2022-08-12 DIAGNOSIS — E108 Type 1 diabetes mellitus with unspecified complications: Secondary | ICD-10-CM

## 2022-08-12 MED ORDER — LANTUS SOLOSTAR 100 UNIT/ML ~~LOC~~ SOPN: 70.0000 [IU] | PEN_INJECTOR | SUBCUTANEOUS | 3 refills | Status: DC

## 2022-08-12 NOTE — Telephone Encounter (Signed)
Specified generic on lantus solostar pen RX per medicaid PDL 2023

## 2022-08-24 ENCOUNTER — Encounter: Payer: Self-pay | Admitting: *Deleted

## 2022-08-24 DIAGNOSIS — Z006 Encounter for examination for normal comparison and control in clinical research program: Secondary | ICD-10-CM

## 2022-08-24 NOTE — Research (Addendum)
[facility] updated with pcp information as pt did not know at time consent was signed.

## 2022-09-29 ENCOUNTER — Encounter: Payer: Self-pay | Admitting: Nurse Practitioner

## 2022-09-29 ENCOUNTER — Ambulatory Visit (INDEPENDENT_AMBULATORY_CARE_PROVIDER_SITE_OTHER): Payer: Medicaid Other | Admitting: Nurse Practitioner

## 2022-09-29 VITALS — BP 124/82 | HR 86 | Temp 98.1°F | Resp 20 | Ht 65.0 in | Wt 235.0 lb

## 2022-09-29 DIAGNOSIS — R6250 Unspecified lack of expected normal physiological development in childhood: Secondary | ICD-10-CM | POA: Diagnosis not present

## 2022-09-29 DIAGNOSIS — E1169 Type 2 diabetes mellitus with other specified complication: Secondary | ICD-10-CM

## 2022-09-29 DIAGNOSIS — Q231 Congenital insufficiency of aortic valve: Secondary | ICD-10-CM

## 2022-09-29 DIAGNOSIS — I10 Essential (primary) hypertension: Secondary | ICD-10-CM

## 2022-09-29 DIAGNOSIS — E876 Hypokalemia: Secondary | ICD-10-CM

## 2022-09-29 DIAGNOSIS — Z6837 Body mass index (BMI) 37.0-37.9, adult: Secondary | ICD-10-CM

## 2022-09-29 DIAGNOSIS — F900 Attention-deficit hyperactivity disorder, predominantly inattentive type: Secondary | ICD-10-CM | POA: Diagnosis not present

## 2022-09-29 DIAGNOSIS — Z23 Encounter for immunization: Secondary | ICD-10-CM

## 2022-09-29 DIAGNOSIS — E785 Hyperlipidemia, unspecified: Secondary | ICD-10-CM | POA: Diagnosis not present

## 2022-09-29 DIAGNOSIS — E108 Type 1 diabetes mellitus with unspecified complications: Secondary | ICD-10-CM

## 2022-09-29 DIAGNOSIS — H6123 Impacted cerumen, bilateral: Secondary | ICD-10-CM

## 2022-09-29 LAB — BAYER DCA HB A1C WAIVED: HB A1C (BAYER DCA - WAIVED): 6.5 % — ABNORMAL HIGH (ref 4.8–5.6)

## 2022-09-29 MED ORDER — ICOSAPENT ETHYL 1 G PO CAPS: 2.0000 g | ORAL_CAPSULE | Freq: Two times a day (BID) | ORAL | 1 refills | Status: DC

## 2022-09-29 MED ORDER — LISDEXAMFETAMINE DIMESYLATE 50 MG PO CAPS: 50.0000 mg | ORAL_CAPSULE | Freq: Every day | ORAL | 0 refills | Status: DC

## 2022-09-29 MED ORDER — FENOFIBRATE 145 MG PO TABS: 145.0000 mg | ORAL_TABLET | Freq: Every day | ORAL | 1 refills | Status: DC

## 2022-09-29 MED ORDER — POTASSIUM CHLORIDE ER 10 MEQ PO TBCR: 10.0000 meq | EXTENDED_RELEASE_TABLET | Freq: Two times a day (BID) | ORAL | 1 refills | Status: DC

## 2022-09-29 MED ORDER — LANTUS SOLOSTAR 100 UNIT/ML ~~LOC~~ SOPN: 70.0000 [IU] | PEN_INJECTOR | SUBCUTANEOUS | 3 refills | Status: DC

## 2022-09-29 MED ORDER — ATORVASTATIN CALCIUM 80 MG PO TABS: 80.0000 mg | ORAL_TABLET | Freq: Every day | ORAL | 1 refills | Status: DC

## 2022-09-29 MED ORDER — METFORMIN HCL 500 MG PO TABS: 500.0000 mg | ORAL_TABLET | Freq: Two times a day (BID) | ORAL | 3 refills | Status: DC

## 2022-09-29 MED ORDER — LISINOPRIL 5 MG PO TABS: 5.0000 mg | ORAL_TABLET | Freq: Every day | ORAL | 1 refills | Status: DC

## 2022-09-29 NOTE — Patient Instructions (Signed)

## 2022-09-29 NOTE — Progress Notes (Signed)
Subjective:    Patient ID: Stephen Richardson, male    DOB: November 09, 1996, 26 y.o.   MRN: [id number]   Chief Complaint: medical management of chronic issues   HPI:  Stephen Richardson is a 26 y.o. who identifies as a male who was assigned male at birth.   Social history: Lives with: mother Work history: unable to work due to [place] in today for follow up of the following chronic medical issues:  1. Type 1 diabetes mellitus with complications ([place]) Wears Freestyle Libre CGM; fasting blood sugars usually run between 120-130 No low blood sugars. Lab Results  Component Value Date   HGBA1C 8.4 (H) 06/28/2022     2. Essential hypertension No c/o chest pain, sob or headaches. Does not check BP at home. BP Readings from Last 3 Encounters:  09/29/22 124/82  06/28/22 132/85  04/28/22 127/73    3. Hyperlipidemia associated with type 2 diabetes mellitus ([place]) Does not really watch his diet; has been trying to exercise more, walking on treadmill. On lipitor and vascepa daily.  Lab Results  Component Value Date   CHOL 223 (H) 06/28/2022   HDL 34 (L) 06/28/2022   LDLCALC 105 (H) 06/28/2022   LDLDIRECT 106 (H) 01/04/2021   TRIG 488 (H) 06/28/2022   CHOLHDL 6.6 (H) 06/28/2022     4. 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  6. Hypokalemia No c/o muscle cramps. Lab Results  Component Value Date   K 4.4 06/28/2022     7. Attention deficit hyperactivity disorder (ADHD), predominantly inattentive type Takes vyvanse daily to help keep him calm, he is very hyperactive without it.  8. BMI 37.0-37.9, adult Weight is up 7 lbs since last check. Wt Readings from Last 3 Encounters:  09/29/22 235 lb (106.6 kg)  06/28/22 228 lb (103.4 kg)  04/28/22 215 lb (97.5 kg)    BMI Readings from Last 3 Encounters:  09/29/22 39.11 kg/m  06/28/22 37.94 kg/m  04/28/22 35.78 kg/m     9. Developmental delay Unable to live on  his own or handle his own finances.   New complaints: Urinary frequency and urine has strong odor x last 3 days; no abdominal or flank pain; no pain with urination, no fever, chills, N/V  No Known Allergies Outpatient Encounter Medications as of 09/29/2022  Medication Sig   Accu-Chek FastClix Lancets MISC Test BS QID Dx E10.8   ACCU-CHEK GUIDE test strip TEST UP TO 4 TIMES DAILY AS DIRECTED   atorvastatin (LIPITOR) 80 MG tablet Take 1 tablet (80 mg total) by mouth daily.   B-D UF III MINI PEN NEEDLES 31G X 5 MM MISC USE WITH INSULIN DAILY DX E10.8   blood glucose meter kit and supplies Dispense based on patient and insurance preference. Use up to four times daily as directed. (FOR ICD-10 E10.9, E11.9).   blood glucose meter kit and supplies Dispense based on patient and insurance preference. Use up to four times daily as directed. (FOR ICD-10 E10.9, E11.9).   Blood Glucose Monitoring Suppl (ACCU-CHEK GUIDE) w/Device KIT 1 kit by Other route 4 (four) times daily as needed. Dx E11.9   Continuous Blood Gluc Receiver (FREESTYLE LIBRE 2 READER) DEVI Use to scan blood sugar continuously. DX: E10.8   Continuous Blood Gluc Sensor (FREESTYLE LIBRE 2 SENSOR) MISC Place 1 sensor on arm every 14 days. Use to test blood sugar continuously. DX: E10.8   fenofibrate (TRICOR) 145 MG tablet Take 1 tablet (  145 mg total) by mouth daily.   icosapent Ethyl (VASCEPA) 1 g capsule Take 2 capsules (2 g total) by mouth 2 (two) times daily.   insulin glargine (LANTUS SOLOSTAR) 100 UNIT/ML Solostar Pen Inject 70 Units into the skin every morning. Please fill generic per medicaid PDL.   insulin lispro (HUMALOG KWIKPEN) 100 UNIT/ML KwikPen Inject 12-20 Units into the skin 3 (three) times daily with meals. Per sliding scale   lisdexamfetamine (VYVANSE) 50 MG capsule Take 1 capsule (50 mg total) by mouth daily.   lisdexamfetamine (VYVANSE) 50 MG capsule Take 1 capsule (50 mg total) by mouth daily.   lisdexamfetamine  (VYVANSE) 50 MG capsule Take 1 capsule (50 mg total) by mouth daily.   lisinopril (ZESTRIL) 5 MG tablet TAKE 1 TABLET (5 MG TOTAL) BY MOUTH DAILY.   metFORMIN (GLUCOPHAGE) 500 MG tablet Take 1 tablet (500 mg total) by mouth 2 (two) times daily with a meal.   potassium chloride (KLOR-CON) 10 MEQ tablet Take 1 tablet (10 mEq total) by mouth 2 (two) times daily.   No facility-administered encounter medications on file as of 09/29/2022.    No past surgical history on file.  Family History  Problem Relation Age of Onset   Kidney disease Father    Diabetes Father    Pancreatitis Father       Controlled substance contract: 09/29/2022     Review of Systems  Constitutional:  Negative for appetite change and diaphoresis.  Eyes:  Negative for pain and visual disturbance.  Respiratory:  Negative for chest tightness and shortness of breath.   Cardiovascular:  Negative for chest pain, palpitations and leg swelling.  Gastrointestinal:  Negative for abdominal pain.  Endocrine: Negative for polydipsia and polyphagia.  Genitourinary:  Positive for frequency. Negative for difficulty urinating, dysuria, flank pain, hematuria and penile pain.       Urine has strong odor  Skin:  Negative for rash.  Neurological:  Negative for dizziness, weakness and headaches.  Hematological:  Negative for adenopathy. Does not bruise/bleed easily.  All other systems reviewed and are negative.      Objective:   Physical Exam Vitals and nursing note reviewed.  Constitutional:      General: He is not in acute distress.    Appearance: Normal appearance. He is well-developed. He is not ill-appearing.  HENT:     Head: Normocephalic and atraumatic.     Right Ear: There is impacted cerumen.     Left Ear: There is impacted cerumen.     Nose: Nose normal.     Mouth/Throat:     Mouth: Mucous membranes are moist.     Pharynx: Oropharynx is clear.  Eyes:     Conjunctiva/sclera: Conjunctivae normal.     Pupils:  Pupils are equal, round, and reactive to light.  Neck:     Vascular: No JVD.  Cardiovascular:     Rate and Rhythm: Normal rate and regular rhythm.  Pulmonary:     Effort: Pulmonary effort is normal. No respiratory distress.     Breath sounds: Normal breath sounds. No wheezing, rhonchi or rales.  Chest:     Chest wall: No tenderness.  Abdominal:     General: Bowel sounds are normal. There is no distension.     Palpations: Abdomen is soft.     Tenderness: There is no abdominal tenderness.  Musculoskeletal:        General: Normal range of motion.     Cervical back: Normal range of motion.  Right lower leg: No edema.     Left lower leg: No edema.  Lymphadenopathy:     Cervical: No cervical adenopathy.  Skin:    General: Skin is warm and dry.     Capillary Refill: Capillary refill takes less than 2 seconds.  Neurological:     Mental Status: He is alert and oriented to person, place, and time.  Psychiatric:        Mood and Affect: Mood normal.       BP 124/82   Pulse 86   Temp 98.1 F (36.7 C) (Temporal)   Resp 20   Ht _0  (1.651 m)   Wt 235 lb (106.6 kg)   SpO2 93%   BMI 39.11 kg/m   HbA1c today 6.5%  S/P bil ear irrigation- TM's normal    Assessment & Plan:   Stephen Richardson comes in today with chief complaint of Medical Management of Chronic Issues   Diagnosis and orders addressed:  1. Type 1 diabetes mellitus with complications (HCC) Watch carb intake. Continue to monitor blood sugar levels - Bayer DCA Hb A1c Waived - insulin glargine (LANTUS SOLOSTAR) 100 UNIT/ML Solostar Pen; Inject 70 Units into the skin every morning. Please fill generic per medicaid PDL.  Dispense: 45 mL; Refill: 3 - metFORMIN (GLUCOPHAGE) 500 MG tablet; Take 1 tablet (500 mg total) by mouth 2 (two) times daily with a meal.  Dispense: 180 tablet; Refill: 3  2. Essential hypertension Low sodium diet. - CBC with Differential/Platelet - CMP14+EGFR - lisinopril (ZESTRIL) 5 MG  tablet; Take 1 tablet (5 mg total) by mouth daily.  Dispense: 90 tablet; Refill: 1  3. Hyperlipidemia associated with type 2 diabetes mellitus (HCC) Low fat diet. - Lipid panel - atorvastatin (LIPITOR) 80 MG tablet; Take 1 tablet (80 mg total) by mouth daily.  Dispense: 90 tablet; Refill: 1 - icosapent Ethyl (VASCEPA) 1 g capsule; Take 2 capsules (2 g total) by mouth 2 (two) times daily.  Dispense: 360 capsule; Refill: 1 - fenofibrate (TRICOR) 145 MG tablet; Take 1 tablet (145 mg total) by mouth daily.  Dispense: 90 tablet; Refill: 1  4. Bicuspid aortic valve 5. Congenital aortic valve insufficiency Keep appointments with cardiology; report any changes.  6. Hypokalemia Labs pending. Report muscle cramps - potassium chloride (KLOR-CON) 10 MEQ tablet; Take 1 tablet (10 mEq total) by mouth 2 (two) times daily.  Dispense: 180 tablet; Refill: 1  7. Attention deficit hyperactivity disorder (ADHD), predominantly inattentive type Stress management. - lisdexamfetamine (VYVANSE) 50 MG capsule; Take 1 capsule (50 mg total) by mouth daily.  Dispense: 30 capsule; Refill: 0 - lisdexamfetamine (VYVANSE) 50 MG capsule; Take 1 capsule (50 mg total) by mouth daily.  Dispense: 30 capsule; Refill: 0 - lisdexamfetamine (VYVANSE) 50 MG capsule; Take 1 capsule (50 mg total) by mouth daily.  Dispense: 30 capsule; Refill: 0  8. BMI 37.0-37.9, adult Discussed diet and exercise for person with BMI > 25 Will recheck weight in 3-6 months 9. Developmental delay   10. Bilateral impacted cerumen [place] 2-3 x a week   Labs pending Health Maintenance reviewed Diet and exercise encouraged  Follow up plan: 3 months  Collene Leyden, FNP student  [place], [place]

## 2022-09-30 LAB — CBC WITH DIFFERENTIAL/PLATELET
Basophils Absolute: 0.1 10*3/uL (ref 0.0–0.2)
Basos: 1 %
EOS (ABSOLUTE): 0.1 10*3/uL (ref 0.0–0.4)
Eos: 2 %
Hematocrit: 44.1 % (ref 37.5–51.0)
Hemoglobin: 15.1 g/dL (ref 13.0–17.7)
Immature Grans (Abs): 0 10*3/uL (ref 0.0–0.1)
Immature Granulocytes: 0 %
Lymphocytes Absolute: 2.6 10*3/uL (ref 0.7–3.1)
Lymphs: 37 %
MCH: 30 pg (ref 26.6–33.0)
MCHC: 34.2 g/dL (ref 31.5–35.7)
MCV: 88 fL (ref 79–97)
Monocytes Absolute: 0.4 10*3/uL (ref 0.1–0.9)
Monocytes: 6 %
Neutrophils Absolute: 3.8 10*3/uL (ref 1.4–7.0)
Neutrophils: 54 %
Platelets: 317 10*3/uL (ref 150–450)
RBC: 5.03 x10E6/uL (ref 4.14–5.80)
RDW: 12.9 % (ref 11.6–15.4)
WBC: 7 10*3/uL (ref 3.4–10.8)

## 2022-09-30 LAB — LIPID PANEL
Chol/HDL Ratio: 7 ratio — ABNORMAL HIGH (ref 0.0–5.0)
Cholesterol, Total: 223 mg/dL — ABNORMAL HIGH (ref 100–199)
HDL: 32 mg/dL — ABNORMAL LOW (ref >39)
LDL Chol Calc (NIH): 94 mg/dL (ref 0–99)
Triglycerides: 583 mg/dL (ref 0–149)
VLDL Cholesterol Cal: 97 mg/dL — ABNORMAL HIGH (ref 5–40)

## 2022-09-30 LAB — CMP14+EGFR
ALT: 10 IU/L (ref 0–44)
AST: 15 IU/L (ref 0–40)
Albumin/Globulin Ratio: 1.8 (ref 1.2–2.2)
Albumin: 4.4 g/dL (ref 4.3–5.2)
Alkaline Phosphatase: 66 IU/L (ref 44–121)
BUN/Creatinine Ratio: 31 — ABNORMAL HIGH (ref 9–20)
BUN: 15 mg/dL (ref 6–20)
Bilirubin Total: 0.3 mg/dL (ref 0.0–1.2)
CO2: 21 mmol/L (ref 20–29)
Calcium: 9.4 mg/dL (ref 8.7–10.2)
Chloride: 104 mmol/L (ref 96–106)
Creatinine, Ser: 0.48 mg/dL — ABNORMAL LOW (ref 0.76–1.27)
Globulin, Total: 2.4 g/dL (ref 1.5–4.5)
Glucose: 122 mg/dL — ABNORMAL HIGH (ref 70–99)
Potassium: 4.6 mmol/L (ref 3.5–5.2)
Sodium: 140 mmol/L (ref 134–144)
Total Protein: 6.8 g/dL (ref 6.0–8.5)
eGFR: 148 mL/min/{1.73_m2} (ref >59)

## 2022-12-30 ENCOUNTER — Ambulatory Visit: Payer: Medicaid Other | Admitting: Nurse Practitioner

## 2022-12-30 ENCOUNTER — Encounter: Payer: Self-pay | Admitting: Nurse Practitioner

## 2022-12-30 VITALS — BP 149/85 | HR 105 | Temp 98.5°F | Resp 20 | Ht 66.75 in | Wt 237.0 lb

## 2022-12-30 DIAGNOSIS — E108 Type 1 diabetes mellitus with unspecified complications: Secondary | ICD-10-CM

## 2022-12-30 DIAGNOSIS — E876 Hypokalemia: Secondary | ICD-10-CM

## 2022-12-30 DIAGNOSIS — Z6837 Body mass index (BMI) 37.0-37.9, adult: Secondary | ICD-10-CM | POA: Diagnosis not present

## 2022-12-30 DIAGNOSIS — I10 Essential (primary) hypertension: Secondary | ICD-10-CM | POA: Diagnosis not present

## 2022-12-30 DIAGNOSIS — F900 Attention-deficit hyperactivity disorder, predominantly inattentive type: Secondary | ICD-10-CM

## 2022-12-30 DIAGNOSIS — E1169 Type 2 diabetes mellitus with other specified complication: Secondary | ICD-10-CM | POA: Diagnosis not present

## 2022-12-30 DIAGNOSIS — E785 Hyperlipidemia, unspecified: Secondary | ICD-10-CM

## 2022-12-30 LAB — CBC WITH DIFFERENTIAL/PLATELET
Basophils Absolute: 0.1 10*3/uL (ref 0.0–0.2)
Basos: 1 %
EOS (ABSOLUTE): 0.1 10*3/uL (ref 0.0–0.4)
Eos: 2 %
Hematocrit: 48.6 % (ref 37.5–51.0)
Hemoglobin: 16.4 g/dL (ref 13.0–17.7)
Immature Grans (Abs): 0 10*3/uL (ref 0.0–0.1)
Immature Granulocytes: 0 %
Lymphocytes Absolute: 2.6 10*3/uL (ref 0.7–3.1)
Lymphs: 34 %
MCH: 29.5 pg (ref 26.6–33.0)
MCHC: 33.7 g/dL (ref 31.5–35.7)
MCV: 87 fL (ref 79–97)
Monocytes Absolute: 0.4 10*3/uL (ref 0.1–0.9)
Monocytes: 5 %
Neutrophils Absolute: 4.4 10*3/uL (ref 1.4–7.0)
Neutrophils: 58 %
Platelets: 347 10*3/uL (ref 150–450)
RBC: 5.56 x10E6/uL (ref 4.14–5.80)
RDW: 13.2 % (ref 11.6–15.4)
WBC: 7.5 10*3/uL (ref 3.4–10.8)

## 2022-12-30 LAB — LIPID PANEL
Chol/HDL Ratio: 8 ratio — ABNORMAL HIGH (ref 0.0–5.0)
Cholesterol, Total: 295 mg/dL — ABNORMAL HIGH (ref 100–199)
HDL: 37 mg/dL — ABNORMAL LOW (ref >39)
LDL Chol Calc (NIH): 132 mg/dL — ABNORMAL HIGH (ref 0–99)
Triglycerides: 670 mg/dL (ref 0–149)
VLDL Cholesterol Cal: 126 mg/dL — ABNORMAL HIGH (ref 5–40)

## 2022-12-30 LAB — CMP14+EGFR
ALT: 10 IU/L (ref 0–44)
AST: 17 IU/L (ref 0–40)
Albumin/Globulin Ratio: 1.8 (ref 1.2–2.2)
Albumin: 4.8 g/dL (ref 4.3–5.2)
Alkaline Phosphatase: 70 IU/L (ref 44–121)
BUN/Creatinine Ratio: 31 — ABNORMAL HIGH (ref 9–20)
BUN: 18 mg/dL (ref 6–20)
Bilirubin Total: 0.4 mg/dL (ref 0.0–1.2)
CO2: 24 mmol/L (ref 20–29)
Calcium: 10.5 mg/dL — ABNORMAL HIGH (ref 8.7–10.2)
Chloride: 98 mmol/L (ref 96–106)
Creatinine, Ser: 0.59 mg/dL — ABNORMAL LOW (ref 0.76–1.27)
Globulin, Total: 2.6 g/dL (ref 1.5–4.5)
Glucose: 130 mg/dL — ABNORMAL HIGH (ref 70–99)
Potassium: 4.3 mmol/L (ref 3.5–5.2)
Sodium: 138 mmol/L (ref 134–144)
Total Protein: 7.4 g/dL (ref 6.0–8.5)
eGFR: 138 mL/min/{1.73_m2} (ref >59)

## 2022-12-30 LAB — BAYER DCA HB A1C WAIVED: HB A1C (BAYER DCA - WAIVED): 7.4 % — ABNORMAL HIGH (ref 4.8–5.6)

## 2022-12-30 MED ORDER — LISDEXAMFETAMINE DIMESYLATE 50 MG PO CAPS: 50.0000 mg | ORAL_CAPSULE | Freq: Every day | ORAL | 0 refills | Status: DC

## 2022-12-30 MED ORDER — FENOFIBRATE 145 MG PO TABS: 145.0000 mg | ORAL_TABLET | Freq: Every day | ORAL | 1 refills | Status: DC

## 2022-12-30 MED ORDER — LISINOPRIL 5 MG PO TABS: 5.0000 mg | ORAL_TABLET | Freq: Every day | ORAL | 1 refills | Status: DC

## 2022-12-30 MED ORDER — ATORVASTATIN CALCIUM 80 MG PO TABS: 80.0000 mg | ORAL_TABLET | Freq: Every day | ORAL | 1 refills | Status: DC

## 2022-12-30 MED ORDER — POTASSIUM CHLORIDE ER 10 MEQ PO TBCR: 10.0000 meq | EXTENDED_RELEASE_TABLET | Freq: Two times a day (BID) | ORAL | 1 refills | Status: DC

## 2022-12-30 MED ORDER — LANTUS SOLOSTAR 100 UNIT/ML ~~LOC~~ SOPN: 70.0000 [IU] | PEN_INJECTOR | SUBCUTANEOUS | 3 refills | Status: DC

## 2022-12-30 MED ORDER — ICOSAPENT ETHYL 1 G PO CAPS: 2.0000 g | ORAL_CAPSULE | Freq: Two times a day (BID) | ORAL | 1 refills | Status: DC

## 2022-12-30 NOTE — Progress Notes (Signed)
Subjective:    Patient ID: Stephen Richardson, male    DOB: 07-21-1996, 27 y.o.   MRN: [id number]   Chief Complaint: medical management of chronic issues     HPI:  Stephen Richardson is a 10 y.o. who identifies as a male who was assigned male at birth.   Social history: Lives with: mom Work history: disability   Comes in today for follow up of the following chronic medical issues:  1. Essential hypertension No c/o chest pain, sob or headache. Does not check blood pressure at home.  BP Readings from Last 3 Encounters:  09/29/22 124/82  06/28/22 132/85  04/28/22 127/73     2. Hyperlipidemia associated with type 2 diabetes mellitus ([place]) Does not watch diet and does no exercise at all. Lab Results  Component Value Date   CHOL 223 (H) 09/29/2022   HDL 32 (L) 09/29/2022   LDLCALC 94 09/29/2022   LDLDIRECT 106 (H) 01/04/2021   TRIG 583 (HH) 09/29/2022   CHOLHDL 7.0 (H) 09/29/2022     3. Type 1 diabetes mellitus with complications (HCC) Fasting blood sugars have been running around 150. Denies any low blood sugars Lab Results  Component Value Date   HGBA1C 6.5 (H) 09/29/2022     4. Hypokalemia No c/o of muscle cramps. Lab Results  Component Value Date   K 4.6 09/29/2022     5. Attention deficit hyperactivity disorder (ADHD), predominantly inattentive type Has been on adhd meds since he  was in middle school. Cannot stay focused on anything without medication. Denies any medication side effects.  6. BMI 37.0-37.9, adult Weight is up 2 lbs Wt Readings from Last 3 Encounters:  12/30/22 237 lb (107.5 kg)  09/29/22 235 lb (106.6 kg)  06/28/22 228 lb (103.4 kg)   BMI Readings from Last 3 Encounters:  12/30/22 37.40 kg/m  09/29/22 39.11 kg/m  06/28/22 37.94 kg/m     New complaints: None today  No Known Allergies Outpatient Encounter Medications as of 12/30/2022  Medication Sig   Accu-Chek FastClix Lancets MISC Test BS QID Dx E10.8   ACCU-CHEK GUIDE test  strip TEST UP TO 4 TIMES DAILY AS DIRECTED   atorvastatin (LIPITOR) 80 MG tablet Take 1 tablet (80 mg total) by mouth daily.   B-D UF III MINI PEN NEEDLES 31G X 5 MM MISC USE WITH INSULIN DAILY DX E10.8   blood glucose meter kit and supplies Dispense based on patient and insurance preference. Use up to four times daily as directed. (FOR ICD-10 E10.9, E11.9).   blood glucose meter kit and supplies Dispense based on patient and insurance preference. Use up to four times daily as directed. (FOR ICD-10 E10.9, E11.9).   Blood Glucose Monitoring Suppl (ACCU-CHEK GUIDE) w/Device KIT 1 kit by Other route 4 (four) times daily as needed. Dx E11.9   Continuous Blood Gluc Receiver (FREESTYLE LIBRE 2 READER) DEVI Use to scan blood sugar continuously. DX: E10.8   Continuous Blood Gluc Sensor (FREESTYLE LIBRE 2 SENSOR) MISC Place 1 sensor on arm every 14 days. Use to test blood sugar continuously. DX: E10.8   fenofibrate (TRICOR) 145 MG tablet Take 1 tablet (145 mg total) by mouth daily.   icosapent Ethyl (VASCEPA) 1 g capsule Take 2 capsules (2 g total) by mouth 2 (two) times daily.   insulin glargine (LANTUS SOLOSTAR) 100 UNIT/ML Solostar Pen Inject 70 Units into the skin every morning. Please fill generic per medicaid PDL.   insulin lispro (HUMALOG KWIKPEN) 100 UNIT/ML KwikPen Inject  12-20 Units into the skin 3 (three) times daily with meals. Per sliding scale   lisdexamfetamine (VYVANSE) 50 MG capsule Take 1 capsule (50 mg total) by mouth daily.   lisdexamfetamine (VYVANSE) 50 MG capsule Take 1 capsule (50 mg total) by mouth daily.   lisdexamfetamine (VYVANSE) 50 MG capsule Take 1 capsule (50 mg total) by mouth daily.   lisinopril (ZESTRIL) 5 MG tablet Take 1 tablet (5 mg total) by mouth daily.   metFORMIN (GLUCOPHAGE) 500 MG tablet Take 1 tablet (500 mg total) by mouth 2 (two) times daily with a meal.   potassium chloride (KLOR-CON) 10 MEQ tablet Take 1 tablet (10 mEq total) by mouth 2 (two) times daily.    No facility-administered encounter medications on file as of 12/30/2022.    No past surgical history on file.  Family History  Problem Relation Age of Onset   Kidney disease Father    Diabetes Father    Pancreatitis Father       Controlled substance contract: n/a     Review of Systems  Constitutional:  Negative for diaphoresis.  Eyes:  Negative for pain.  Respiratory:  Negative for shortness of breath.   Cardiovascular:  Negative for chest pain, palpitations and leg swelling.  Gastrointestinal:  Negative for abdominal pain.  Endocrine: Negative for polydipsia.  Skin:  Negative for rash.  Neurological:  Negative for dizziness, weakness and headaches.  Hematological:  Does not bruise/bleed easily.  All other systems reviewed and are negative.      Objective:   Physical Exam Vitals and nursing note reviewed.  Constitutional:      Appearance: Normal appearance. He is well-developed.  HENT:     Head: Normocephalic.     Nose: Nose normal.     Mouth/Throat:     Mouth: Mucous membranes are moist.     Pharynx: Oropharynx is clear.  Eyes:     Pupils: Pupils are equal, round, and reactive to light.  Neck:     Thyroid: No thyroid mass or thyromegaly.     Vascular: No carotid bruit or JVD.     Trachea: Phonation normal.  Cardiovascular:     Rate and Rhythm: Normal rate and regular rhythm.  Pulmonary:     Effort: Pulmonary effort is normal. No respiratory distress.     Breath sounds: Normal breath sounds.  Abdominal:     General: Bowel sounds are normal.     Palpations: Abdomen is soft.     Tenderness: There is no abdominal tenderness.  Musculoskeletal:        General: Normal range of motion.     Cervical back: Normal range of motion and neck supple.  Lymphadenopathy:     Cervical: No cervical adenopathy.  Skin:    General: Skin is warm and dry.  Neurological:     Mental Status: He is alert and oriented to person, place, and time.  Psychiatric:         Behavior: Behavior normal.        Thought Content: Thought content normal.        Judgment: Judgment normal.    BP (!) 149/85   Pulse (!) 105   Temp 98.5 F (36.9 C) (Temporal)   Resp 20   Ht 5' 6.75" (1.695 m)   Wt 237 lb (107.5 kg)   SpO2 93%   BMI 37.40 kg/m   Hgba1c 7.4       Assessment & Plan:  Breydan Shillingburg comes in today with chief complaint  of Medical Management of Chronic Issues   Diagnosis and orders addressed:  1. Essential hypertension Low sodium diet - CBC with Differential/Platelet - CMP14+EGFR - lisinopril (ZESTRIL) 5 MG tablet; Take 1 tablet (5 mg total) by mouth daily.  Dispense: 90 tablet; Refill: 1  2. Hyperlipidemia associated with type 2 diabetes mellitus (HCC) Low fat diet - Lipid panel - atorvastatin (LIPITOR) 80 MG tablet; Take 1 tablet (80 mg total) by mouth daily.  Dispense: 90 tablet; Refill: 1 - fenofibrate (TRICOR) 145 MG tablet; Take 1 tablet (145 mg total) by mouth daily.  Dispense: 90 tablet; Refill: 1 - icosapent Ethyl (VASCEPA) 1 g capsule; Take 2 capsules (2 g total) by mouth 2 (two) times daily.  Dispense: 360 capsule; Refill: 1  3. Type 1 diabetes mellitus with complications (HCC) Contineu t to watch carbs in diet - Bayer DCA Hb A1c Waived - insulin glargine (LANTUS SOLOSTAR) 100 UNIT/ML Solostar Pen; Inject 70 Units into the skin every morning. Please fill generic per medicaid PDL.  Dispense: 45 mL; Refill: 3  4. Hypokalemia - potassium chloride (KLOR-CON) 10 MEQ tablet; Take 1 tablet (10 mEq total) by mouth 2 (two) times daily.  Dispense: 180 tablet; Refill: 1  5. Attention deficit hyperactivity disorder (ADHD), predominantly inattentive type - ToxASSURE Select 13 (MW), Urine - Drug Screen 10 W/Conf, Se - lisdexamfetamine (VYVANSE) 50 MG capsule; Take 1 capsule (50 mg total) by mouth daily.  Dispense: 30 capsule; Refill: 0 - lisdexamfetamine (VYVANSE) 50 MG capsule; Take 1 capsule (50 mg total) by mouth daily.  Dispense: 30  capsule; Refill: 0 - lisdexamfetamine (VYVANSE) 50 MG capsule; Take 1 capsule (50 mg total) by mouth daily.  Dispense: 30 capsule; Refill: 0  6. BMI 37.0-37.9, adult Discussed diet and exercise for person with BMI >25 Will recheck weight in 3-6 months    Labs pending Health Maintenance reviewed Diet and exercise encouraged  Follow up plan: 3 months   Mary-Margaret Daphine Deutscher, FNP

## 2023-01-03 LAB — DRUG SCREEN 10 W/CONF, SERUM
Amphetamines, IA: NEGATIVE ng/mL
Barbiturates, IA: NEGATIVE ug/mL
Benzodiazepines, IA: NEGATIVE ng/mL
Cocaine & Metabolite, IA: NEGATIVE ng/mL
Methadone, IA: NEGATIVE ng/mL
Opiates, IA: NEGATIVE ng/mL
Oxycodones, IA: NEGATIVE ng/mL
Phencyclidine, IA: NEGATIVE ng/mL
Propoxyphene, IA: NEGATIVE ng/mL
THC(Marijuana) Metabolite, IA: NEGATIVE ng/mL

## 2023-03-20 ENCOUNTER — Telehealth: Payer: Self-pay

## 2023-03-20 NOTE — Telephone Encounter (Signed)
   Carollee Leitz ([facility]) PA Case ID #: [id number] Need Help? Call us at [phone] Status sent iconSent to Plan today Drug FreeStyle Libre 2 Sensor ePA cloud logo Form [facility] [place] Electronic [place] Form [phone] NCPDP)

## 2023-03-20 NOTE — Telephone Encounter (Signed)
Patient Advocate Encounter  Prior Authorization for [employer] 2 sensor has been approved.

## 2023-03-28 ENCOUNTER — Ambulatory Visit (INDEPENDENT_AMBULATORY_CARE_PROVIDER_SITE_OTHER): Payer: Medicaid Other | Admitting: Nurse Practitioner

## 2023-03-28 ENCOUNTER — Encounter: Payer: Self-pay | Admitting: Nurse Practitioner

## 2023-03-28 VITALS — BP 144/89 | HR 107 | Temp 98.0°F | Resp 20 | Ht 66.0 in | Wt 239.0 lb

## 2023-03-28 DIAGNOSIS — E108 Type 1 diabetes mellitus with unspecified complications: Secondary | ICD-10-CM

## 2023-03-28 DIAGNOSIS — R6250 Unspecified lack of expected normal physiological development in childhood: Secondary | ICD-10-CM | POA: Diagnosis not present

## 2023-03-28 DIAGNOSIS — I10 Essential (primary) hypertension: Secondary | ICD-10-CM | POA: Diagnosis not present

## 2023-03-28 DIAGNOSIS — E876 Hypokalemia: Secondary | ICD-10-CM | POA: Diagnosis not present

## 2023-03-28 DIAGNOSIS — F900 Attention-deficit hyperactivity disorder, predominantly inattentive type: Secondary | ICD-10-CM

## 2023-03-28 DIAGNOSIS — Z6837 Body mass index (BMI) 37.0-37.9, adult: Secondary | ICD-10-CM

## 2023-03-28 DIAGNOSIS — E785 Hyperlipidemia, unspecified: Secondary | ICD-10-CM

## 2023-03-28 DIAGNOSIS — Q231 Congenital insufficiency of aortic valve: Secondary | ICD-10-CM | POA: Diagnosis not present

## 2023-03-28 DIAGNOSIS — E1169 Type 2 diabetes mellitus with other specified complication: Secondary | ICD-10-CM | POA: Diagnosis not present

## 2023-03-28 LAB — BAYER DCA HB A1C WAIVED: HB A1C (BAYER DCA - WAIVED): 6.3 % — ABNORMAL HIGH (ref 4.8–5.6)

## 2023-03-28 MED ORDER — LISDEXAMFETAMINE DIMESYLATE 50 MG PO CAPS
50.0000 mg | ORAL_CAPSULE | Freq: Every day | ORAL | 0 refills | Status: DC
Start: 2023-04-29 — End: 2023-06-27

## 2023-03-28 MED ORDER — LISDEXAMFETAMINE DIMESYLATE 50 MG PO CAPS
50.0000 mg | ORAL_CAPSULE | Freq: Every day | ORAL | 0 refills | Status: DC
Start: 2023-03-30 — End: 2023-06-27

## 2023-03-28 MED ORDER — LISDEXAMFETAMINE DIMESYLATE 50 MG PO CAPS
50.0000 mg | ORAL_CAPSULE | Freq: Every day | ORAL | 0 refills | Status: DC
Start: 2023-05-29 — End: 2023-06-27

## 2023-03-28 NOTE — Patient Instructions (Signed)

## 2023-03-28 NOTE — Progress Notes (Signed)
Subjective:    Patient ID: Stephen Richardson, male    DOB: 15-Apr-1996, 27 y.o.   MRN: [id number]   Chief Complaint: Medical Management of Chronic Issues    HPI:  Stephen Richardson is a 27 y.o. who identifies as a male who was assigned male at birth.   Social history: Lives with: mom Work history: [employer]   Comes in today for follow up of the following chronic medical issues:  1. Essential hypertension No c/o chest pain, sob or headache. Does not check blood pressure at home. BP Readings from Last 3 Encounters:  03/28/23 (!) 144/89  12/30/22 (!) 149/85  09/29/22 124/82     2. Hyperlipidemia associated with type 2 diabetes mellitus Does try to watch diet and has been staying very active the last few months Lab Results  Component Value Date   CHOL 295 (H) 12/30/2022   HDL 37 (L) 12/30/2022   LDLCALC 132 (H) 12/30/2022   LDLDIRECT 106 (H) 01/04/2021   TRIG 670 (HH) 12/30/2022   CHOLHDL 8.0 (H) 12/30/2022     3. Type 1 diabetes mellitus with complications Blood sugars are running around 100-150. No low blood sugars Lab Results  Component Value Date   HGBA1C 7.4 (H) 12/30/2022     4. Bicuspid aortic valve Has not seen cardiology in over a year  5. Attention deficit hyperactivity disorder (ADHD), predominantly inattentive type Is on vyvanse and neds it to stay focused  6. Developmental delay Has to live with his mom- she helps him with his finances  7. Hypokalemia No c/o muscle cramps Lab Results  Component Value Date   K 4.3 12/30/2022     8. BMI 37.0-37.9, adult No recent weight changes Wt Readings from Last 3 Encounters:  03/28/23 239 lb (108.4 kg)  12/30/22 237 lb (107.5 kg)  09/29/22 235 lb (106.6 kg)   BMI Readings from Last 3 Encounters:  03/28/23 38.58 kg/m  12/30/22 37.40 kg/m  09/29/22 39.11 kg/m      New complaints: None today  No Known Allergies Outpatient Encounter Medications as of 03/28/2023  Medication Sig   Accu-Chek  FastClix Lancets MISC Test BS QID Dx E10.8   ACCU-CHEK GUIDE test strip TEST UP TO 4 TIMES DAILY AS DIRECTED   atorvastatin (LIPITOR) 80 MG tablet Take 1 tablet (80 mg total) by mouth daily.   B-D UF III MINI PEN NEEDLES 31G X 5 MM MISC USE WITH INSULIN DAILY DX E10.8   Blood Glucose Monitoring Suppl (ACCU-CHEK GUIDE) w/Device KIT 1 kit by Other route 4 (four) times daily as needed. Dx E11.9   Continuous Blood Gluc Receiver (FREESTYLE LIBRE 2 READER) DEVI Use to scan blood sugar continuously. DX: E10.8   Continuous Blood Gluc Sensor (FREESTYLE LIBRE 2 SENSOR) MISC Place 1 sensor on arm every 14 days. Use to test blood sugar continuously. DX: E10.8   fenofibrate (TRICOR) 145 MG tablet Take 1 tablet (145 mg total) by mouth daily.   icosapent Ethyl (VASCEPA) 1 g capsule Take 2 capsules (2 g total) by mouth 2 (two) times daily.   insulin glargine (LANTUS SOLOSTAR) 100 UNIT/ML Solostar Pen Inject 70 Units into the skin every morning. Please fill generic per medicaid PDL.   insulin lispro (HUMALOG KWIKPEN) 100 UNIT/ML KwikPen Inject 12-20 Units into the skin 3 (three) times daily with meals. Per sliding scale   lisdexamfetamine (VYVANSE) 50 MG capsule Take 1 capsule (50 mg total) by mouth daily.   lisinopril (ZESTRIL) 5 MG tablet Take 1  tablet (5 mg total) by mouth daily.   metFORMIN (GLUCOPHAGE) 500 MG tablet Take 1 tablet (500 mg total) by mouth 2 (two) times daily with a meal.   potassium chloride (KLOR-CON) 10 MEQ tablet Take 1 tablet (10 mEq total) by mouth 2 (two) times daily.   lisdexamfetamine (VYVANSE) 50 MG capsule Take 1 capsule (50 mg total) by mouth daily.   lisdexamfetamine (VYVANSE) 50 MG capsule Take 1 capsule (50 mg total) by mouth daily.   No facility-administered encounter medications on file as of 03/28/2023.    History reviewed. No pertinent surgical history.  Family History  Problem Relation Age of Onset   Kidney disease Father    Diabetes Father    Pancreatitis Father        Controlled substance contract: n/a     Review of Systems  Constitutional:  Negative for diaphoresis.  Eyes:  Negative for pain.  Respiratory:  Negative for shortness of breath.   Cardiovascular:  Negative for chest pain, palpitations and leg swelling.  Gastrointestinal:  Negative for abdominal pain.  Endocrine: Negative for polydipsia.  Skin:  Negative for rash.  Neurological:  Negative for dizziness, weakness and headaches.  Hematological:  Does not bruise/bleed easily.  All other systems reviewed and are negative.      Objective:   Physical Exam Vitals and nursing note reviewed.  Constitutional:      Appearance: Normal appearance. He is well-developed.  HENT:     Head: Normocephalic.     Nose: Nose normal.     Mouth/Throat:     Mouth: Mucous membranes are moist.     Pharynx: Oropharynx is clear.  Eyes:     Pupils: Pupils are equal, round, and reactive to light.  Neck:     Thyroid: No thyroid mass or thyromegaly.     Vascular: No carotid bruit or JVD.     Trachea: Phonation normal.  Cardiovascular:     Rate and Rhythm: Normal rate and regular rhythm.  Pulmonary:     Effort: Pulmonary effort is normal. No respiratory distress.     Breath sounds: Normal breath sounds.  Abdominal:     General: Bowel sounds are normal.     Palpations: Abdomen is soft.     Tenderness: There is no abdominal tenderness.  Musculoskeletal:        General: Normal range of motion.     Cervical back: Normal range of motion and neck supple.  Lymphadenopathy:     Cervical: No cervical adenopathy.  Skin:    General: Skin is warm and dry.  Neurological:     Mental Status: He is alert and oriented to person, place, and time.  Psychiatric:        Behavior: Behavior normal.        Thought Content: Thought content normal.        Judgment: Judgment normal.       Hgba1c 6.3% BP (!) 144/89   Pulse (!) 107   Temp 98 F (36.7 C) (Temporal)   Resp 20   Ht  (1.676 m)   Wt  239 lb (108.4 kg)   SpO2 93%   BMI 38.58 kg/m      Assessment & Plan:   Stephen Richardson comes in today with chief complaint of Medical Management of Chronic Issues   Diagnosis and orders addressed:  1. Essential hypertension Low sodium diet - CBC with Differential/Platelet - CMP14+EGFR  2. Hyperlipidemia associated with type 2 diabetes mellitus Low fat diet -  Lipid panel  3. Type 1 diabetes mellitus with complications Continue to wtahc carbs in diet - Bayer DCA Hb A1c Waived  4. Bicuspid aortic valve Keep follow up with cardiology  5. Attention deficit hyperactivity disorder (ADHD), predominantly inattentive type Stress management - lisdexamfetamine (VYVANSE) 50 MG capsule; Take 1 capsule (50 mg total) by mouth daily.  Dispense: 30 capsule; Refill: 0 - lisdexamfetamine (VYVANSE) 50 MG capsule; Take 1 capsule (50 mg total) by mouth daily.  Dispense: 30 capsule; Refill: 0 - lisdexamfetamine (VYVANSE) 50 MG capsule; Take 1 capsule (50 mg total) by mouth daily.  Dispense: 30 capsule; Refill: 0  6. Developmental delay  7. Hypokalemia Labs pending  8. BMI 37.0-37.9, adult Discussed diet and exercise for person with BMI >25 Will recheck weight in 3-6 months    Labs pending Health Maintenance reviewed Diet and exercise encouraged  Follow up plan: 3 months   Mary-Margaret Daphine Deutscher, FNP

## 2023-03-29 LAB — CMP14+EGFR
ALT: 9 IU/L (ref 0–44)
AST: 15 IU/L (ref 0–40)
Albumin/Globulin Ratio: 2 (ref 1.2–2.2)
Albumin: 4.4 g/dL (ref 4.3–5.2)
Alkaline Phosphatase: 76 IU/L (ref 44–121)
BUN/Creatinine Ratio: 36 — ABNORMAL HIGH (ref 9–20)
BUN: 17 mg/dL (ref 6–20)
Bilirubin Total: 0.3 mg/dL (ref 0.0–1.2)
CO2: 20 mmol/L (ref 20–29)
Calcium: 9.6 mg/dL (ref 8.7–10.2)
Chloride: 101 mmol/L (ref 96–106)
Creatinine, Ser: 0.47 mg/dL — ABNORMAL LOW (ref 0.76–1.27)
Globulin, Total: 2.2 g/dL (ref 1.5–4.5)
Glucose: 85 mg/dL (ref 70–99)
Potassium: 4.4 mmol/L (ref 3.5–5.2)
Sodium: 138 mmol/L (ref 134–144)
Total Protein: 6.6 g/dL (ref 6.0–8.5)
eGFR: 148 mL/min/{1.73_m2} (ref >59)

## 2023-03-29 LAB — CBC WITH DIFFERENTIAL/PLATELET
Basophils Absolute: 0.1 10*3/uL (ref 0.0–0.2)
Basos: 1 %
EOS (ABSOLUTE): 0.1 10*3/uL (ref 0.0–0.4)
Eos: 2 %
Hematocrit: 45.7 % (ref 37.5–51.0)
Hemoglobin: 15.4 g/dL (ref 13.0–17.7)
Immature Grans (Abs): 0 10*3/uL (ref 0.0–0.1)
Immature Granulocytes: 0 %
Lymphocytes Absolute: 2.7 10*3/uL (ref 0.7–3.1)
Lymphs: 38 %
MCH: 29.3 pg (ref 26.6–33.0)
MCHC: 33.7 g/dL (ref 31.5–35.7)
MCV: 87 fL (ref 79–97)
Monocytes Absolute: 0.4 10*3/uL (ref 0.1–0.9)
Monocytes: 6 %
Neutrophils Absolute: 3.8 10*3/uL (ref 1.4–7.0)
Neutrophils: 53 %
Platelets: 324 10*3/uL (ref 150–450)
RBC: 5.25 x10E6/uL (ref 4.14–5.80)
RDW: 13 % (ref 11.6–15.4)
WBC: 7 10*3/uL (ref 3.4–10.8)

## 2023-03-29 LAB — LIPID PANEL
Chol/HDL Ratio: 8.7 ratio — ABNORMAL HIGH (ref 0.0–5.0)
Cholesterol, Total: 271 mg/dL — ABNORMAL HIGH (ref 100–199)
HDL: 31 mg/dL — ABNORMAL LOW (ref >39)
Triglycerides: 857 mg/dL (ref 0–149)

## 2023-03-31 ENCOUNTER — Other Ambulatory Visit: Payer: Self-pay | Admitting: Pharmacist

## 2023-03-31 DIAGNOSIS — E1169 Type 2 diabetes mellitus with other specified complication: Secondary | ICD-10-CM

## 2023-04-14 NOTE — Progress Notes (Signed)
    03/31/2023 Name: Stephen Richardson MRN: [id number] DOB: 05-Apr-1996   Discussed Libre 3 CGM with patient.  Samples given and CGM logs reviewed.  His A1c was at goal today.  Will f/u with patient Re: HLD and triglycerides.   Kieth Brightly, PharmD, BCACP Clinical Pharmacist, [facility]

## 2023-04-20 ENCOUNTER — Telehealth: Payer: Self-pay

## 2023-04-20 NOTE — Progress Notes (Signed)
   Care Guide Note  04/20/2023 Name: Korde Hopkin MRN: [id number] DOB: [birth year]  Referred by: Bennie Pierini, FNP Reason for referral : Care Coordination (Outreach to schedule with pharmd )   Kerron Buetow is a 27 y.o. year old male who is a primary care patient of Bennie Pierini, FNP. Warne Jennette was referred to the pharmacist for assistance related to HLD.    Successful contact was made with the patient to discuss pharmacy services including being ready for the pharmacist to call at least 5 minutes before the scheduled appointment time, to have medication bottles and any blood sugar or blood pressure readings ready for review. The patient agreed to meet with the pharmacist via with the pharmacist via telephone visit on (date/time).  05/30/2023  Penne Lash, RMA Care Guide [facility]  [place], [place] [address] Direct Dial: [phone] [email or web address]

## 2023-05-30 ENCOUNTER — Ambulatory Visit (INDEPENDENT_AMBULATORY_CARE_PROVIDER_SITE_OTHER): Payer: Medicaid Other | Admitting: Pharmacist

## 2023-05-30 VITALS — BP 141/90

## 2023-05-30 DIAGNOSIS — Z794 Long term (current) use of insulin: Secondary | ICD-10-CM | POA: Diagnosis not present

## 2023-05-30 DIAGNOSIS — E785 Hyperlipidemia, unspecified: Secondary | ICD-10-CM | POA: Diagnosis not present

## 2023-05-30 DIAGNOSIS — E1169 Type 2 diabetes mellitus with other specified complication: Secondary | ICD-10-CM

## 2023-05-30 DIAGNOSIS — E108 Type 1 diabetes mellitus with unspecified complications: Secondary | ICD-10-CM

## 2023-05-30 MED ORDER — FREESTYLE LIBRE 3 SENSOR MISC
11 refills | Status: DC
Start: 2023-05-30 — End: 2023-11-07

## 2023-05-30 MED ORDER — INSULIN LISPRO (1 UNIT DIAL) 100 UNIT/ML (KWIKPEN)
12.0000 [IU] | PEN_INJECTOR | Freq: Three times a day (TID) | SUBCUTANEOUS | 11 refills | Status: DC
Start: 2023-05-30 — End: 2024-04-01

## 2023-05-30 MED ORDER — ICOSAPENT ETHYL 1 G PO CAPS
2.0000 g | ORAL_CAPSULE | Freq: Two times a day (BID) | ORAL | 1 refills | Status: DC
Start: 2023-05-30 — End: 2023-06-27

## 2023-05-30 NOTE — Progress Notes (Signed)
05/30/2023 Name: Stephen Richardson MRN: [id number] DOB: 1996/03/01  Type 1 Diabetes and HyperTriglyceridemia   Stephen Richardson is a 27 y.o. year old male who was referred for medication management by their primary care provider, Bennie Pierini, FNP. They presented for a face to face visit today.   They were referred to the pharmacist by their PCP for assistance in managing diabetes and hyperlipidemia.  Patient was referred and last seen by Primary Care Provider on 03/28/23.  Patient's a1c was controlled at 6.3%.  He is enjoying using FSL 3 CGM (uses phone as reader)  Patient states he trying to eat better and walks 3 times weekly for exercise.   Subjective:  Care Team: Primary Care Provider: Bennie Pierini, FNP ; Next Scheduled Visit: 06/2023  Medication Access/Adherence  Current Pharmacy:  [phone] [place] - [address] [place] [address] Phone: [phone] Fax: [phone]  [facility] [place] - [address] [place] [address] Phone: [phone] Fax: [phone]   Patient reports affordability concerns with their medications: No  Patient reports access/transportation concerns to their pharmacy: No  Patient reports adherence concerns with their medications:  Yes  sometimes forgets   Diabetes:  Current medications:  lantus/lispro, metformin Medications tried in the past: n/a  Current glucose readings: FBG < 140, still fingersticks against CGM when needed Using freestyle libre 3 CGM (phone as reader)  -Patient is injecting insulin 4 or more times daily -He greatly benefits from his libre continuous glucose monitoring system  Patient denies hypoglycemic s/sx including dizziness, shakiness, sweating. Patient denies hyperglycemic symptoms including polyuria, polydipsia, polyphagia, nocturia, neuropathy, blurred vision.  Current meal patterns:  Patient reported dietary habits:  Eats 2-3 meals/day   Breakfast: toast, sandwich   Lunch:sometimes he skips, salads, chicken   Dinner: pizza, burger, subs   Snacks:cookies, sweets (limits now)   Drinks:reports diet soda, increased water   Discussed meal planning options and Plate method for healthy eating Avoid sugary drinks and desserts Incorporate balanced protein, non starchy veggies, 1 serving of carbohydrate with each meal Increase water intake Increase physical activity as able  Current physical activity: walks  Current medication access support: medicaid  Hyperlipidemia/ASCVD Risk Reduction  Current lipid lowering medications: fenofibrate, atorvastatin 80mg  adding back vascepa (patient doesn't recall taking this but has been filled in the past  Current physical activity: walks  Current medication access support: medicaid    Objective:  Lab Results  Component Value Date   HGBA1C 6.3 (H) 03/28/2023    Lab Results  Component Value Date   CREATININE 0.47 (L) 03/28/2023   BUN 17 03/28/2023   NA 138 03/28/2023   K 4.4 03/28/2023   CL 101 03/28/2023   CO2 20 03/28/2023    Lab Results  Component Value Date   CHOL 271 (H) 03/28/2023   HDL 31 (L) 03/28/2023   LDLCALC Comment (A) 03/28/2023   LDLDIRECT 106 (H) 01/04/2021   TRIG 857 (HH) 03/28/2023   CHOLHDL 8.7 (H) 03/28/2023    Medications Reviewed Today     Reviewed by Bennie Pierini, FNP (Family Nurse Practitioner) on 03/28/23 at 1155  Med List Status: <None>   Medication Order Taking? Sig Documenting Provider Last Dose Status Informant  Accu-Chek FastClix Lancets MISC [id number] Yes Test BS QID Dx E10.8 Bennie Pierini, FNP Taking Active   ACCU-CHEK GUIDE test strip [id number] Yes TEST UP TO 4 TIMES DAILY AS DIRECTED Bennie Pierini, FNP Taking  Active   atorvastatin (LIPITOR) 80 MG tablet [id number] Yes Take 1 tablet (80 mg total) by mouth daily. Bennie Pierini, FNP Taking Active   B-D UF III MINI PEN NEEDLES  31G X 5 MM MISC [id number] Yes USE WITH INSULIN DAILY DX E10.8 Daphine Deutscher, Mary-Margaret, FNP Taking Active   Blood Glucose Monitoring Suppl (ACCU-CHEK GUIDE) w/Device KIT [id number] Yes 1 kit by Other route 4 (four) times daily as needed. Dx E11.9 Junie Spencer, FNP Taking Active   Continuous Blood Gluc Receiver (FREESTYLE LIBRE 2 READER) DEVI [id number] Yes Use to scan blood sugar continuously. DX: E10.8 Bennie Pierini, FNP Taking Active   Continuous Blood Gluc Sensor (FREESTYLE LIBRE 2 SENSOR) MISC [id number] Yes Place 1 sensor on arm every 14 days. Use to test blood sugar continuously. DX: E10.8 Bennie Pierini, FNP Taking Active   fenofibrate (TRICOR) 145 MG tablet [id number] Yes Take 1 tablet (145 mg total) by mouth daily. Daphine Deutscher Mary-Margaret, FNP Taking Active   icosapent Ethyl (VASCEPA) 1 g capsule [id number] Yes Take 2 capsules (2 g total) by mouth 2 (two) times daily. Daphine Deutscher Mary-Margaret, FNP Taking Active   insulin glargine (LANTUS SOLOSTAR) 100 UNIT/ML Solostar Pen [id number] Yes Inject 70 Units into the skin every morning. Please fill generic per medicaid PDL. Daphine Deutscher Mary-Margaret, FNP Taking Active   insulin lispro (HUMALOG KWIKPEN) 100 UNIT/ML KwikPen [id number] Yes Inject 12-20 Units into the skin 3 (three) times daily with meals. Per sliding scale Bennie Pierini, FNP Taking Active   lisdexamfetamine (VYVANSE) 50 MG capsule [id number]  Take 1 capsule (50 mg total) by mouth daily. Daphine Deutscher, Mary-Margaret, FNP  Active   lisdexamfetamine (VYVANSE) 50 MG capsule [id number]  Take 1 capsule (50 mg total) by mouth daily. Daphine Deutscher, Mary-Margaret, FNP  Active   lisdexamfetamine (VYVANSE) 50 MG capsule [id number]  Take 1 capsule (50 mg total) by mouth daily. Daphine Deutscher, Mary-Margaret, FNP  Active   lisinopril (ZESTRIL) 5 MG tablet [id number] Yes Take 1 tablet (5 mg total) by mouth daily. Daphine Deutscher Mary-Margaret, FNP Taking Active   metFORMIN (GLUCOPHAGE) 500 MG tablet [id number] Yes Take 1 tablet  (500 mg total) by mouth 2 (two) times daily with a meal. Daphine Deutscher, Mary-Margaret, FNP Taking Active   potassium chloride (KLOR-CON) 10 MEQ tablet [id number] Yes Take 1 tablet (10 mEq total) by mouth 2 (two) times daily. Bennie Pierini, FNP Taking Active              Assessment/Plan:   Diabetes: - Currently controlled - Reviewed long term cardiovascular and renal outcomes of uncontrolled blood sugar - Reviewed goal A1c, goal fasting, and goal 2 hour post prandial glucose - Reviewed dietary/lifestyle modifications including healthy plate - Reviewed lifestyle modifications including: - Recommend to continue current regimen   Humalog refill sent in (not filled since 3/13, but had extra a home) - Recommend to check glucose using FSL 3  Patient was out of refills  New RX called in  Sample left up front with Tegaderm cover (likes to swim, etc)    Hyperlipidemia/ASCVD Risk Reduction: - Currently uncontrolled.  - Reviewed long term complications of uncontrolled cholesterol - Reviewed dietary recommendations including FOLLOWING A HEART HEALTHY DIET/HEALTHY PLATE METHOD - Recommend to : Continue statin, fibrate Start vascepa Will follow up lipid panel in 3-74months  Discussed compliance with meds    Follow Up Plan: 1 month PCP; 2 months PharmD   Kieth Brightly, PharmD, BCACP Clinical Pharmacist, [facility]

## 2023-06-27 ENCOUNTER — Ambulatory Visit (INDEPENDENT_AMBULATORY_CARE_PROVIDER_SITE_OTHER): Payer: Medicaid Other | Admitting: Nurse Practitioner

## 2023-06-27 ENCOUNTER — Encounter: Payer: Self-pay | Admitting: Nurse Practitioner

## 2023-06-27 VITALS — BP 149/88 | HR 105 | Temp 98.2°F | Resp 20 | Ht 66.0 in | Wt 237.0 lb

## 2023-06-27 DIAGNOSIS — I10 Essential (primary) hypertension: Secondary | ICD-10-CM | POA: Diagnosis not present

## 2023-06-27 DIAGNOSIS — E1169 Type 2 diabetes mellitus with other specified complication: Secondary | ICD-10-CM

## 2023-06-27 DIAGNOSIS — Z7984 Long term (current) use of oral hypoglycemic drugs: Secondary | ICD-10-CM

## 2023-06-27 DIAGNOSIS — Z0000 Encounter for general adult medical examination without abnormal findings: Secondary | ICD-10-CM

## 2023-06-27 DIAGNOSIS — E119 Type 2 diabetes mellitus without complications: Secondary | ICD-10-CM

## 2023-06-27 DIAGNOSIS — E785 Hyperlipidemia, unspecified: Secondary | ICD-10-CM | POA: Diagnosis not present

## 2023-06-27 DIAGNOSIS — Z794 Long term (current) use of insulin: Secondary | ICD-10-CM

## 2023-06-27 DIAGNOSIS — F900 Attention-deficit hyperactivity disorder, predominantly inattentive type: Secondary | ICD-10-CM | POA: Diagnosis not present

## 2023-06-27 DIAGNOSIS — Z6837 Body mass index (BMI) 37.0-37.9, adult: Secondary | ICD-10-CM | POA: Diagnosis not present

## 2023-06-27 DIAGNOSIS — E876 Hypokalemia: Secondary | ICD-10-CM | POA: Diagnosis not present

## 2023-06-27 DIAGNOSIS — R6250 Unspecified lack of expected normal physiological development in childhood: Secondary | ICD-10-CM | POA: Diagnosis not present

## 2023-06-27 DIAGNOSIS — Z0001 Encounter for general adult medical examination with abnormal findings: Secondary | ICD-10-CM

## 2023-06-27 DIAGNOSIS — E108 Type 1 diabetes mellitus with unspecified complications: Secondary | ICD-10-CM | POA: Diagnosis not present

## 2023-06-27 LAB — BAYER DCA HB A1C WAIVED: HB A1C (BAYER DCA - WAIVED): 6.6 % — ABNORMAL HIGH (ref 4.8–5.6)

## 2023-06-27 MED ORDER — LISDEXAMFETAMINE DIMESYLATE 50 MG PO CAPS
50.0000 mg | ORAL_CAPSULE | Freq: Every day | ORAL | 0 refills | Status: DC
Start: 2023-07-27 — End: 2023-10-02

## 2023-06-27 MED ORDER — LISDEXAMFETAMINE DIMESYLATE 50 MG PO CAPS
50.0000 mg | ORAL_CAPSULE | Freq: Every day | ORAL | 0 refills | Status: DC
Start: 2023-06-27 — End: 2023-10-02

## 2023-06-27 MED ORDER — POTASSIUM CHLORIDE ER 10 MEQ PO TBCR
10.0000 meq | EXTENDED_RELEASE_TABLET | Freq: Two times a day (BID) | ORAL | 1 refills | Status: DC
Start: 2023-06-27 — End: 2024-01-02

## 2023-06-27 MED ORDER — METFORMIN HCL 500 MG PO TABS
500.0000 mg | ORAL_TABLET | Freq: Two times a day (BID) | ORAL | 3 refills | Status: DC
Start: 2023-06-27 — End: 2023-09-05

## 2023-06-27 MED ORDER — LISINOPRIL 5 MG PO TABS
5.0000 mg | ORAL_TABLET | Freq: Every day | ORAL | 1 refills | Status: DC
Start: 2023-06-27 — End: 2023-11-07

## 2023-06-27 MED ORDER — ATORVASTATIN CALCIUM 80 MG PO TABS
80.0000 mg | ORAL_TABLET | Freq: Every day | ORAL | 1 refills | Status: DC
Start: 2023-06-27 — End: 2024-01-02

## 2023-06-27 MED ORDER — LISDEXAMFETAMINE DIMESYLATE 50 MG PO CAPS
50.0000 mg | ORAL_CAPSULE | Freq: Every day | ORAL | 0 refills | Status: DC
Start: 2023-08-26 — End: 2023-10-02

## 2023-06-27 MED ORDER — FENOFIBRATE 145 MG PO TABS
145.0000 mg | ORAL_TABLET | Freq: Every day | ORAL | 1 refills | Status: DC
Start: 2023-06-27 — End: 2024-01-02

## 2023-06-27 MED ORDER — ICOSAPENT ETHYL 1 G PO CAPS
2.0000 g | ORAL_CAPSULE | Freq: Two times a day (BID) | ORAL | 1 refills | Status: DC
Start: 2023-06-27 — End: 2024-01-02

## 2023-06-27 NOTE — Progress Notes (Signed)
Subjective:    Patient ID: Stephen Richardson, male    DOB: 1996/02/07, 27 y.o.   MRN: [id number]   Chief Complaint: Annual Exam    HPI:  Stephen Richardson is a 85 y.o. who identifies as a male who was assigned male at birth.   Social history: Lives with: mom Work history: works for  [employer] in today for follow up of the following chronic medical issues:  1. Annual physical exam  2. Developmental delay Lives with his mom. Is not able to live on his own  3. Essential hypertension No c/po chest pain, or sob. Does not check blood pressure at home. BP Readings from Last 3 Encounters:  06/27/23 (!) 149/88  05/30/23 (!) 141/90  03/28/23 (!) 144/89     4. Diabetes mellitus treated with insulin and oral medication (HCC) Fasting blood sugars are averaging 120-210. Not always watching diet Lab Results  Component Value Date   HGBA1C 6.3 (H) 03/28/2023     5. Attention deficit hyperactivity disorder (ADHD), predominantly inattentive type Is on vyvanse. Is not able to concentrate and slow down without meds.  6. Hypokalemia No c/o muscle cramps Lab Results  Component Value Date   K 4.4 03/28/2023     7. BMI 37.0-37.9, adult No recent weight changes Wt Readings from Last 3 Encounters:  06/27/23 237 lb (107.5 kg)  03/28/23 239 lb (108.4 kg)  12/30/22 237 lb (107.5 kg)   BMI Readings from Last 3 Encounters:  06/27/23 38.25 kg/m  03/28/23 38.58 kg/m  12/30/22 37.40 kg/m      New complaints: None today  No Known Allergies Outpatient Encounter Medications as of 06/27/2023  Medication Sig   Accu-Chek FastClix Lancets MISC Test BS QID Dx E10.8   ACCU-CHEK GUIDE test strip TEST UP TO 4 TIMES DAILY AS DIRECTED   atorvastatin (LIPITOR) 80 MG tablet Take 1 tablet (80 mg total) by mouth daily.   B-D UF III MINI PEN NEEDLES 31G X 5 MM MISC USE WITH INSULIN DAILY DX E10.8   Blood Glucose Monitoring Suppl (ACCU-CHEK GUIDE) w/Device KIT 1 kit by Other  route 4 (four) times daily as needed. Dx E11.9   Continuous Glucose Sensor (FREESTYLE LIBRE 3 SENSOR) MISC Place 1 sensor on the skin every 14 days. Use to check glucose continuously. DX 10.9   fenofibrate (TRICOR) 145 MG tablet Take 1 tablet (145 mg total) by mouth daily.   icosapent Ethyl (VASCEPA) 1 g capsule Take 2 capsules (2 g total) by mouth 2 (two) times daily.   insulin glargine (LANTUS SOLOSTAR) 100 UNIT/ML Solostar Pen Inject 70 Units into the skin every morning. Please fill generic per medicaid PDL.   insulin lispro (HUMALOG KWIKPEN) 100 UNIT/ML KwikPen Inject 12-20 Units into the skin 3 (three) times daily with meals. Per sliding scale   lisdexamfetamine (VYVANSE) 50 MG capsule Take 1 capsule (50 mg total) by mouth daily.   lisinopril (ZESTRIL) 5 MG tablet Take 1 tablet (5 mg total) by mouth daily.   metFORMIN (GLUCOPHAGE) 500 MG tablet Take 1 tablet (500 mg total) by mouth 2 (two) times daily with a meal.   potassium chloride (KLOR-CON) 10 MEQ tablet Take 1 tablet (10 mEq total) by mouth 2 (two) times daily.   lisdexamfetamine (VYVANSE) 50 MG capsule Take 1 capsule (50 mg total) by mouth daily.   lisdexamfetamine (VYVANSE) 50 MG capsule Take 1 capsule (50 mg total) by mouth daily.   No facility-administered encounter medications on file as  of 06/27/2023.    History reviewed. No pertinent surgical history.  Family History  Problem Relation Age of Onset   Kidney disease Father    Diabetes Father    Pancreatitis Father       Controlled substance contract: n/a     Review of Systems  Constitutional:  Negative for diaphoresis.  Eyes:  Negative for pain.  Respiratory:  Negative for shortness of breath.   Cardiovascular:  Negative for chest pain, palpitations and leg swelling.  Gastrointestinal:  Negative for abdominal pain.  Endocrine: Negative for polydipsia.  Skin:  Negative for rash.  Neurological:  Negative for dizziness, weakness and headaches.  Hematological:   Does not bruise/bleed easily.  All other systems reviewed and are negative.      Objective:   Physical Exam Vitals and nursing note reviewed.  Constitutional:      Appearance: Normal appearance. He is well-developed.  HENT:     Head: Normocephalic.     Nose: Nose normal.     Mouth/Throat:     Mouth: Mucous membranes are moist.     Pharynx: Oropharynx is clear.  Eyes:     Pupils: Pupils are equal, round, and reactive to light.  Neck:     Thyroid: No thyroid mass or thyromegaly.     Vascular: No carotid bruit or JVD.     Trachea: Phonation normal.  Cardiovascular:     Rate and Rhythm: Normal rate and regular rhythm.  Pulmonary:     Effort: Pulmonary effort is normal. No respiratory distress.     Breath sounds: Normal breath sounds.  Abdominal:     General: Bowel sounds are normal.     Palpations: Abdomen is soft.     Tenderness: There is no abdominal tenderness.  Musculoskeletal:        General: Normal range of motion.     Cervical back: Normal range of motion and neck supple.  Lymphadenopathy:     Cervical: No cervical adenopathy.  Skin:    General: Skin is warm and dry.  Neurological:     Mental Status: He is alert and oriented to person, place, and time.  Psychiatric:        Behavior: Behavior normal.        Thought Content: Thought content normal.        Judgment: Judgment normal.    BP (!) 149/88   Pulse (!) 105   Temp 98.2 F (36.8 C) (Temporal)   Resp 20   Ht 5\' 6"  (1.676 m)   Wt 237 lb (107.5 kg)   SpO2 95%   BMI 38.25 kg/m  Hgba1c 6.6%       Assessment & Plan:  Stephen Richardson comes in today with chief complaint of Annual Exam   Diagnosis and orders addressed:  1. Annual physical exam  2. Developmental delay  3. Essential hypertension Low sodium diet - CBC with Differential/Platelet - CMP14+EGFR - lisinopril (ZESTRIL) 5 MG tablet; Take 1 tablet (5 mg total) by mouth daily.  Dispense: 90 tablet; Refill: 1  4. Diabetes mellitus treated  with insulin and oral medication (HCC) Continue to watch carbs in diet - Bayer DCA Hb A1c Waived - Lipid panel - metFORMIN (GLUCOPHAGE) 500 MG tablet; Take 1 tablet (500 mg total) by mouth 2 (two) times daily with a meal.  Dispense: 180 tablet; Refill: 3  5. Attention deficit hyperactivity disorder (ADHD), predominantly inattentive type - lisdexamfetamine (VYVANSE) 50 MG capsule; Take 1 capsule (50 mg total) by mouth daily.  Dispense: 30 capsule; Refill: 0 - lisdexamfetamine (VYVANSE) 50 MG capsule; Take 1 capsule (50 mg total) by mouth daily.  Dispense: 30 capsule; Refill: 0 - lisdexamfetamine (VYVANSE) 50 MG capsule; Take 1 capsule (50 mg total) by mouth daily.  Dispense: 30 capsule; Refill: 0  6. Hypokalemia Labs pending - potassium chloride (KLOR-CON) 10 MEQ tablet; Take 1 tablet (10 mEq total) by mouth 2 (two) times daily.  Dispense: 180 tablet; Refill: 1  7. BMI 37.0-37.9, adult Discussed diet and exercise for person with BMI >25 Will recheck weight in 3-6 months\  8. Hyperlipidemia associated with type 2 diabetes mellitus (HCC) Low fat diet - atorvastatin (LIPITOR) 80 MG tablet; Take 1 tablet (80 mg total) by mouth daily.  Dispense: 90 tablet; Refill: 1 - fenofibrate (TRICOR) 145 MG tablet; Take 1 tablet (145 mg total) by mouth daily.  Dispense: 90 tablet; Refill: 1 - icosapent Ethyl (VASCEPA) 1 g capsule; Take 2 capsules (2 g total) by mouth 2 (two) times daily.  Dispense: 360 capsule; Refill: 1   Labs pending Health Maintenance reviewed Diet and exercise encouraged  Follow up plan: 3 months   Mary-Margaret Daphine Deutscher, FNP

## 2023-06-28 LAB — LIPID PANEL
Chol/HDL Ratio: 6.9 ratio — ABNORMAL HIGH (ref 0.0–5.0)
Cholesterol, Total: 228 mg/dL — ABNORMAL HIGH (ref 100–199)
HDL: 33 mg/dL — ABNORMAL LOW (ref >39)
LDL Chol Calc (NIH): 78 mg/dL (ref 0–99)
Triglycerides: 729 mg/dL (ref 0–149)
VLDL Cholesterol Cal: 117 mg/dL — ABNORMAL HIGH (ref 5–40)

## 2023-06-28 LAB — CMP14+EGFR
ALT: 11 IU/L (ref 0–44)
AST: 16 IU/L (ref 0–40)
Albumin: 4.4 g/dL (ref 4.3–5.2)
Alkaline Phosphatase: 82 IU/L (ref 44–121)
BUN/Creatinine Ratio: 23 — ABNORMAL HIGH (ref 9–20)
BUN: 13 mg/dL (ref 6–20)
Bilirubin Total: 0.3 mg/dL (ref 0.0–1.2)
CO2: 22 mmol/L (ref 20–29)
Calcium: 10 mg/dL (ref 8.7–10.2)
Chloride: 101 mmol/L (ref 96–106)
Creatinine, Ser: 0.56 mg/dL — ABNORMAL LOW (ref 0.76–1.27)
Globulin, Total: 2.6 g/dL (ref 1.5–4.5)
Glucose: 74 mg/dL (ref 70–99)
Potassium: 4.2 mmol/L (ref 3.5–5.2)
Sodium: 143 mmol/L (ref 134–144)
Total Protein: 7 g/dL (ref 6.0–8.5)
eGFR: 140 mL/min/{1.73_m2} (ref >59)

## 2023-06-28 LAB — CBC WITH DIFFERENTIAL/PLATELET
Basophils Absolute: 0.1 10*3/uL (ref 0.0–0.2)
Basos: 1 %
EOS (ABSOLUTE): 0.1 10*3/uL (ref 0.0–0.4)
Eos: 1 %
Hematocrit: 45.4 % (ref 37.5–51.0)
Hemoglobin: 15.3 g/dL (ref 13.0–17.7)
Immature Grans (Abs): 0 10*3/uL (ref 0.0–0.1)
Immature Granulocytes: 0 %
Lymphocytes Absolute: 3.3 10*3/uL — ABNORMAL HIGH (ref 0.7–3.1)
Lymphs: 40 %
MCH: 29.2 pg (ref 26.6–33.0)
MCHC: 33.7 g/dL (ref 31.5–35.7)
MCV: 87 fL (ref 79–97)
Monocytes Absolute: 0.6 10*3/uL (ref 0.1–0.9)
Monocytes: 7 %
Neutrophils Absolute: 4.2 10*3/uL (ref 1.4–7.0)
Neutrophils: 51 %
Platelets: 352 10*3/uL (ref 150–450)
RBC: 5.24 x10E6/uL (ref 4.14–5.80)
RDW: 13.5 % (ref 11.6–15.4)
WBC: 8.3 10*3/uL (ref 3.4–10.8)

## 2023-07-01 ENCOUNTER — Other Ambulatory Visit: Payer: Self-pay | Admitting: Nurse Practitioner

## 2023-07-18 ENCOUNTER — Ambulatory Visit (INDEPENDENT_AMBULATORY_CARE_PROVIDER_SITE_OTHER): Payer: Medicaid Other | Admitting: Pharmacist

## 2023-07-18 DIAGNOSIS — E108 Type 1 diabetes mellitus with unspecified complications: Secondary | ICD-10-CM

## 2023-08-01 NOTE — Progress Notes (Signed)
07/18/2023 Name: Stephen Richardson MRN: [id number] DOB: [birth year]  Chief Complaint  Patient presents with   Diabetes    Stephen Richardson is a 27 y.o. year old male who presented for a telephone visit.  I connected with  Stephen Richardson on 08/01/23 by telephone and verified that I am speaking with the correct person using two identifiers.  I discussed the limitations of evaluation and management by telemedicine. The patient expressed understanding and agreed to proceed.  Patient was located in her home and PharmD in PCP office during this visit.    They were referred to the pharmacist by their PCP for assistance in managing diabetes and hyperlipidemia.    Subjective:  Care Team: Primary Care Provider: Bennie Pierini, FNP ; Next Scheduled Visit: 09/2023 PharmD-TBD  Medication Access/Adherence  [facility]  [phone] [place] - [address] [place] [address] Phone: [phone] Fax: [phone]  [facility] [place], [place] - [address] [place] [address] Phone: [phone] Fax: [phone]   Patient reports affordability concerns with their medications: No  Patient reports access/transportation concerns to their pharmacy: No  Patient reports adherence concerns with their medications:  Yes  may forget to take; his mom does try to help/encourage   Diabetes:  Current medications: Lantus, Humalog, metformin Medications tried in the past: n/a  Current glucose readings: mostly FBG<150, some PPBG up to 200 Using libre 3 CGM with phone  -Patient is injecting insulin 4 or more times daily -He greatly benefits from a continuous glucose monitoring system  Patient denies hypoglycemic s/sx including dizziness, shakiness, sweating. Patient denies hyperglycemic symptoms including polyuria, polydipsia, polyphagia, nocturia, neuropathy, blurred vision.  Current meal patterns:  Discussed meal  planning options and Plate method for healthy eating Avoid sugary drinks and desserts Incorporate balanced protein, non starchy veggies, 1 serving of carbohydrate with each meal Increase water intake Increase physical activity as able   Current physical activity: walks  Current medication access support: [place] medicaid  Objective:  Lab Results  Component Value Date   HGBA1C 6.6 (H) 06/27/2023    Lab Results  Component Value Date   CREATININE 0.56 (L) 06/27/2023   BUN 13 06/27/2023   NA 143 06/27/2023   K 4.2 06/27/2023   CL 101 06/27/2023   CO2 22 06/27/2023    Lab Results  Component Value Date   CHOL 228 (H) 06/27/2023   HDL 33 (L) 06/27/2023   LDLCALC 78 06/27/2023   LDLDIRECT 106 (H) 01/04/2021   TRIG 729 (HH) 06/27/2023   CHOLHDL 6.9 (H) 06/27/2023    Medications Reviewed Today     Reviewed by Danella Maiers, [facility] (Pharmacist) on 08/01/23 at 1322  Med List Status: <None>   Medication Order Taking? Sig Documenting Provider Last Dose Status Informant  Accu-Chek FastClix Lancets MISC [id number] No Test BS QID Dx E10.8 Bennie Pierini, FNP Taking Active   atorvastatin (LIPITOR) 80 MG tablet [id number]  Take 1 tablet (80 mg total) by mouth daily. Bennie Pierini, FNP  Active   B-D UF III MINI PEN NEEDLES 31G X 5 MM MISC [id number] No USE WITH INSULIN DAILY DX E10.8 Daphine Deutscher, Mary-Margaret, FNP Taking Active   Blood Glucose Monitoring Suppl (ACCU-CHEK GUIDE) w/Device KIT [id number] No 1 kit by Other route 4 (four) times daily as needed. Dx E11.9 Junie Spencer, FNP Taking Active   Continuous Glucose Sensor (FREESTYLE LIBRE 3 SENSOR) [place] [id number] No Place 1 sensor  on the skin every 14 days. Use to check glucose continuously. DX 10.9 Daphine Deutscher, Mary-Margaret, FNP Taking Active   fenofibrate (TRICOR) 145 MG tablet [id number]  Take 1 tablet (145 mg total) by mouth daily. Daphine Deutscher, Mary-Margaret, FNP  Active   glucose blood (ACCU-CHEK GUIDE) test strip [id number]  DX: E10.89  Bennie Pierini, FNP  Active   icosapent Ethyl (VASCEPA) 1 g capsule [id number]  Take 2 capsules (2 g total) by mouth 2 (two) times daily. Daphine Deutscher, Mary-Margaret, FNP  Active   insulin glargine (LANTUS SOLOSTAR) 100 UNIT/ML Solostar Pen [id number] No Inject 70 Units into the skin every morning. Please fill generic per medicaid PDL. Daphine Deutscher Mary-Margaret, FNP Taking Active   insulin lispro (HUMALOG KWIKPEN) 100 UNIT/ML KwikPen [id number] No Inject 12-20 Units into the skin 3 (three) times daily with meals. Per sliding scale Bennie Pierini, FNP Taking Active   lisdexamfetamine (VYVANSE) 50 MG capsule [id number]  Take 1 capsule (50 mg total) by mouth daily. Bennie Pierini, FNP  Expired 07/27/23 2359   lisdexamfetamine (VYVANSE) 50 MG capsule [id number]  Take 1 capsule (50 mg total) by mouth daily. Daphine Deutscher, Mary-Margaret, FNP  Active   lisdexamfetamine (VYVANSE) 50 MG capsule [id number]  Take 1 capsule (50 mg total) by mouth daily. Daphine Deutscher, Mary-Margaret, FNP  Active   lisinopril (ZESTRIL) 5 MG tablet [id number]  Take 1 tablet (5 mg total) by mouth daily. Daphine Deutscher, Mary-Margaret, FNP  Active   metFORMIN (GLUCOPHAGE) 500 MG tablet [id number]  Take 1 tablet (500 mg total) by mouth 2 (two) times daily with a meal. Daphine Deutscher, Mary-Margaret, FNP  Active   potassium chloride (KLOR-CON) 10 MEQ tablet [id number]  Take 1 tablet (10 mEq total) by mouth 2 (two) times daily. Daphine Deutscher, Mary-Margaret, FNP  Active               Assessment/Plan:   Diabetes: - Currently controlled, but would like to review patient's CGM data - Reviewed long term cardiovascular and renal outcomes of uncontrolled blood sugar - Reviewed goal A1c, goal fasting, and goal 2 hour post prandial glucose - Reviewed dietary modifications including FOLLOWING A HEART HEALTHY DIET/HEALTHY PLATE METHOD - Reviewed all medications, but we are uncertain of his hyperlipidemia regimen and home and will schedule in-person PharmD visit to clarify  all medications - Recommend to :   STOP metformin (T1DM)  Continue basal/bolus (Lantus/Humalog) regimen  Patient to continue libre 3 CGM using his Iphone - Recommend to check glucose continuously using Libre 3 CGM    Follow Up Plan: in-person PharmD visit to be scheduled  Kieth Brightly, PharmD, BCACP Clinical Pharmacist, [facility]

## 2023-08-24 ENCOUNTER — Ambulatory Visit: Payer: Medicaid Other

## 2023-08-28 ENCOUNTER — Other Ambulatory Visit: Payer: Self-pay | Admitting: Nurse Practitioner

## 2023-09-05 ENCOUNTER — Ambulatory Visit (INDEPENDENT_AMBULATORY_CARE_PROVIDER_SITE_OTHER): Payer: Medicaid Other | Admitting: *Deleted

## 2023-09-05 ENCOUNTER — Ambulatory Visit (INDEPENDENT_AMBULATORY_CARE_PROVIDER_SITE_OTHER): Payer: Medicaid Other | Admitting: Pharmacist

## 2023-09-05 DIAGNOSIS — E108 Type 1 diabetes mellitus with unspecified complications: Secondary | ICD-10-CM

## 2023-09-05 LAB — HM DIABETES EYE EXAM

## 2023-09-05 MED ORDER — ACCU-CHEK FASTCLIX LANCETS MISC: 11 refills | Status: AC

## 2023-09-05 MED ORDER — ACCU-CHEK GUIDE VI STRP: ORAL_STRIP | 10 refills | Status: AC

## 2023-09-05 MED ORDER — TOUJEO MAX SOLOSTAR 300 UNIT/ML ~~LOC~~ SOPN
70.0000 [IU] | PEN_INJECTOR | Freq: Every day | SUBCUTANEOUS | 6 refills | Status: DC
Start: 2023-09-05 — End: 2023-11-07

## 2023-09-05 MED ORDER — ACCU-CHEK GUIDE W/DEVICE KIT
1.0000 | PACK | Freq: Four times a day (QID) | 0 refills | Status: DC | PRN
Start: 2023-09-05 — End: 2023-09-05

## 2023-09-05 MED ORDER — ACCU-CHEK GUIDE W/DEVICE KIT: PACK | 0 refills | Status: AC

## 2023-09-05 MED ORDER — FREESTYLE LIBRE 3 PLUS SENSOR MISC
6 refills | Status: DC
Start: 2023-09-05 — End: 2024-02-23

## 2023-09-05 NOTE — Progress Notes (Signed)
f

## 2023-09-05 NOTE — Patient Instructions (Addendum)
It was great to see you today!  STOP metformin  CONTINUE taking your insulin - Lantus (grey pen) 70 units at night and Humalog (blue pen) 10 to 20 units with each meal. See if you are able to get a cooler and ice pack to take your insulin to work.   Please try to fill the [facility] [place] 3 sensor today (or the [employer] 3 +, if the 3 is not in stock).

## 2023-09-05 NOTE — Progress Notes (Signed)
Stephen Richardson arrived 09/05/2023 and has given verbal consent to obtain images and complete their overdue diabetic retinal screening.  The images have been sent to an ophthalmologist or optometrist for review and interpretation.  Results will be sent back to Stephen Pierini, FNP for review.  Patient has been informed they will be contacted when we receive the results via telephone or MyChart

## 2023-09-05 NOTE — Progress Notes (Signed)
09/05/2023 Name: Stephen Richardson MRN: [id number] DOB: 1996-11-06  Chief Complaint  Patient presents with   Diabetes    Stephen Richardson is a 27 y.o. year old male who presented for a face to face visit.   They were referred to the pharmacist by their PCP for assistance in managing diabetes.    Subjective:  Care Team: Primary Care Provider: Bennie Pierini, FNP ; Next Scheduled Visit: 10/02/23  Medication Access/Adherence  Current Pharmacy:  [phone] [place] - [address] [place] [address] Phone: [phone] Fax: [phone]  [facility] [place] - [address] [place] [address] Phone: [phone] Fax: [phone]   Patient reports affordability concerns with their medications: No  Patient reports access/transportation concerns to their pharmacy: No  Patient reports adherence concerns with their medications:  Yes  Works Mon, Tues, Wed - usually forgets to take his medications on those days. Will go out to eat after working, and does not always take his insulin. Hours are between 5AM-8AM, varies each day.   Reports that he does not have an ice pack to keep his insulin cool when he is working outside.   Diabetes:  Current medications: insulin glargine (Lantus) 70 units at bedtime, insulin lispro (Humalog) 10-20 units with meals (tends to do 20 units if BG is >200, 10 units if BG is lower- but this varies with his BG and meal choice), metformin 500 mg BID  Patient asks if he can decrease Lantus dose so that the pen lasts longer.   Current glucose readings: frequently 200 in the afternoons, denies frequent low BG but did have a BG of 60 mg/dL yesterday (at 29JJ) - had apple juice to bring it up then ate lunch.  Reports that his Accu-Chek Guide Me meter is not working - he has not had supplies to check his blood sugar since August. He has used his neighbors meter occasionally to check  his BG. Has not had a FL sensor in a couple of weeks.   Date of Download: n/a since 08/01/23 - pt reports he had been using reader (?) + freestyle libre 2 because that is what he had samples of. Also tried to use a friend's Dexcom but this was not compatible with his phone.   Observed patterns:      Patient reports hypoglycemic s/sx including dizziness. Reports occasional hypoglycemia unawareness. Reports hyperglycemic symptoms including neuropathy when BG is 230s. No blurry vision. Occasional polydipsia, polyphagia, and nocturia.   Current meal patterns:  - Breakfast: eggs, bacon - sometimes biscuits - 20 units humalog (sometimes 10 if BG is lower) - Lunch: [employer] if he goes to work (sausage- takes bread off of biscuit) - skips lunch insulin dose if he forgets his insulin at work.  - Supper: Variable  - Drinks: drinks 2 gallons of water/day.   Current physical activity: Walking, working  Current medication access support: Medicaid, disability  Hyperlipidemia/ASCVD Risk Reduction  Current lipid lowering medications: atorvastatin 80 mg daily, Vascepa (icosapent ethyl) 2g BID, fenofibrate 145 mg daily  Objective:  Lab Results  Component Value Date   HGBA1C 6.6 (H) 06/27/2023    Lab Results  Component Value Date   CREATININE 0.56 (L) 06/27/2023   BUN 13 06/27/2023   NA 143 06/27/2023   K 4.2 06/27/2023   CL 101 06/27/2023   CO2 22 06/27/2023    Lab Results  Component Value Date   CHOL  228 (H) 06/27/2023   HDL 33 (L) 06/27/2023   LDLCALC 78 06/27/2023   LDLDIRECT 106 (H) 01/04/2021   TRIG 729 (HH) 06/27/2023   CHOLHDL 6.9 (H) 06/27/2023    Medications Reviewed Today     Reviewed by Particia Lather, RPH (Pharmacist) on 09/05/23 at 1512  Med List Status: <None>   Medication Order Taking? Sig Documenting Provider Last Dose Status Informant  Accu-Chek FastClix Lancets MISC [id number]  Test BS QID Dx E10.8 Daphine Deutscher, Mary-Margaret, FNP  Active   atorvastatin (LIPITOR) 80 MG  tablet [id number] Yes Take 1 tablet (80 mg total) by mouth daily. Bennie Pierini, FNP Taking Active   B-D UF III MINI PEN NEEDLES 31G X 5 MM MISC [id number]  USE WITH INSULIN DAILY DX E10.8 Daphine Deutscher, Mary-Margaret, FNP  Active   Blood Glucose Monitoring Suppl (ACCU-CHEK GUIDE) w/Device KIT [id number]  Use to test blood sugars 4 times daily as directed Dx E10.9 Daphine Deutscher, Mary-Margaret, FNP  Active   Continuous Glucose Sensor (FREESTYLE LIBRE 3 PLUS SENSOR) MISC [id number] Yes Change sensor every 15 days. Daphine Deutscher Mary-Margaret, FNP  Active   Continuous Glucose Sensor (FREESTYLE LIBRE 3 SENSOR) [place] [id number]  Place 1 sensor on the skin every 14 days. Use to check glucose continuously. DX 10.9 Daphine Deutscher, Mary-Margaret, FNP  Active   fenofibrate (TRICOR) 145 MG tablet [id number] Yes Take 1 tablet (145 mg total) by mouth daily. Bennie Pierini, FNP Taking Active   glucose blood (ACCU-CHEK GUIDE) test strip [id number]  DX: E10.89 Bennie Pierini, FNP  Active   icosapent Ethyl (VASCEPA) 1 g capsule [id number] Yes Take 2 capsules (2 g total) by mouth 2 (two) times daily. Daphine Deutscher, Mary-Margaret, FNP Taking Active   insulin glargine, 2 Unit Dial, (TOUJEO MAX SOLOSTAR) 300 UNIT/ML Solostar Pen [id number] Yes Inject 70 Units into the skin at bedtime. Daphine Deutscher, Mary-Margaret, FNP  Active   insulin lispro (HUMALOG KWIKPEN) 100 UNIT/ML KwikPen [id number] Yes Inject 12-20 Units into the skin 3 (three) times daily with meals. Per sliding scale Bennie Pierini, FNP Taking Active   lisdexamfetamine (VYVANSE) 50 MG capsule [id number]  Take 1 capsule (50 mg total) by mouth daily. Bennie Pierini, FNP  Expired 07/27/23 2359   lisdexamfetamine (VYVANSE) 50 MG capsule [id number]  Take 1 capsule (50 mg total) by mouth daily. Bennie Pierini, FNP  Expired 08/26/23 2359   lisdexamfetamine (VYVANSE) 50 MG capsule [id number] Yes Take 1 capsule (50 mg total) by mouth daily. Daphine Deutscher, Mary-Margaret, FNP Taking Active    lisinopril (ZESTRIL) 5 MG tablet [id number] Yes Take 1 tablet (5 mg total) by mouth daily. Daphine Deutscher Mary-Margaret, FNP Taking Active   potassium chloride (KLOR-CON) 10 MEQ tablet [id number] Yes Take 1 tablet (10 mEq total) by mouth 2 (two) times daily. Bennie Pierini, FNP Taking Active             Assessment/Plan:   Diabetes: - Currently uncontrolled per most recent FL3 AGP, though this is from August 2024. Last A1c of 6.6% was well controlled below goal <7%, though patient was consistently wearing CGM at that time. Pt is adherent to long acting insulin, occasionally misses doses of meal-time insulin due to schedule. Discussed importance of always taking Humalog with meals. Pt interested in switching from Lantus to more concentrated insulin (Toujeo Max Solostar 300 units/mL) to prolong use of insulin pen. Metformin is unlikely to be improving BG and is contributing to higher pill burden.  - Reviewed long term cardiovascular and renal outcomes of uncontrolled blood sugar - Reviewed goal A1c, goal fasting,  and goal 2 hour post prandial glucose - Recommend to STOP metformin.  - Recommend to switch from basal insulin Lantus to [employer] - continue 70 units daily at bedtime - Recommend to continue insulin lispro (Humalog) 10-20 units with EVERY meal. Discussed opportunities to remain adherent to meal time insulin, including bringing ice pack and cooler with him to work.  - Provided pt with sample of FL3 in clinic and applied sensor. Connected pt to [facility]. [facility] FL[facility] sensors during appt. Sent Rx for FL3+ in case FL3 is on backorder [facility]. - Recommend to check glucose continuously with CGM. - Repeat A1c at PCP appt 10/02/23 - [facility]. [facility] reported [facility] up FL3 sensors today (FL3+ not covered on insurance). Instructed pharmacy to inactivate metformin Rx, and replace Lantus with Toujeo (going through insurance  for $0 copay, no PA required).   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 Pt on maximum statin and fibrate. Would recommend drawing direct-LDL to assess CVD risk.  - Recommend to continue current regimen    Follow Up Plan: PCP 10/02/23, Pharmacist telephone 11/07/23   Nils Pyle, PharmD PGY1 Pharmacy Resident  Kieth Brightly, PharmD, BCACP Clinical Pharmacist, [facility]

## 2023-10-02 ENCOUNTER — Encounter: Payer: Self-pay | Admitting: Nurse Practitioner

## 2023-10-02 ENCOUNTER — Ambulatory Visit: Payer: Medicaid Other | Admitting: Nurse Practitioner

## 2023-10-02 VITALS — BP 135/79 | HR 87 | Temp 97.8°F | Resp 20 | Wt 236.0 lb

## 2023-10-02 DIAGNOSIS — E876 Hypokalemia: Secondary | ICD-10-CM | POA: Diagnosis not present

## 2023-10-02 DIAGNOSIS — Z23 Encounter for immunization: Secondary | ICD-10-CM

## 2023-10-02 DIAGNOSIS — Q231 Congenital insufficiency of aortic valve: Secondary | ICD-10-CM

## 2023-10-02 DIAGNOSIS — I10 Essential (primary) hypertension: Secondary | ICD-10-CM

## 2023-10-02 DIAGNOSIS — E1169 Type 2 diabetes mellitus with other specified complication: Secondary | ICD-10-CM | POA: Diagnosis not present

## 2023-10-02 DIAGNOSIS — F900 Attention-deficit hyperactivity disorder, predominantly inattentive type: Secondary | ICD-10-CM | POA: Diagnosis not present

## 2023-10-02 DIAGNOSIS — Z6837 Body mass index (BMI) 37.0-37.9, adult: Secondary | ICD-10-CM | POA: Diagnosis not present

## 2023-10-02 DIAGNOSIS — E108 Type 1 diabetes mellitus with unspecified complications: Secondary | ICD-10-CM | POA: Diagnosis not present

## 2023-10-02 DIAGNOSIS — Q2381 Bicuspid aortic valve: Secondary | ICD-10-CM

## 2023-10-02 DIAGNOSIS — E785 Hyperlipidemia, unspecified: Secondary | ICD-10-CM

## 2023-10-02 LAB — BAYER DCA HB A1C WAIVED: HB A1C (BAYER DCA - WAIVED): 6.8 % — ABNORMAL HIGH (ref 4.8–5.6)

## 2023-10-02 MED ORDER — LISDEXAMFETAMINE DIMESYLATE 50 MG PO CAPS: 50.0000 mg | ORAL_CAPSULE | Freq: Every day | ORAL | 0 refills | Status: DC

## 2023-10-02 NOTE — Progress Notes (Signed)
Subjective:    Patient ID: Stephen Richardson, male    DOB: May 28, 1996, 27 y.o.   MRN: [id number]   Chief Complaint: medical management of chronic issues     HPI:  Stephen Richardson is a 27 y.o. who identifies as a male who was assigned male at birth.   Social history: Lives with: mom Work history: works for a [employer] in today for follow up of the following chronic medical issues:  1. Essential hypertension No c/o chest pain, sob or headache. Does not check blood pressure at home. BP Readings from Last 3 Encounters:  06/27/23 (!) 149/88  05/30/23 (!) 141/90  03/28/23 (!) 144/89     2. Congenital aortic valve insufficiency 3. Bicuspid aortic valve Is suppose to be in a research study for his heart.  4. Hyperlipidemia associated with type 2 diabetes mellitus (HCC) Does not watch diet and does very little exercise. Lab Results  Component Value Date   CHOL 228 (H) 06/27/2023   HDL 33 (L) 06/27/2023   LDLCALC 78 06/27/2023   LDLDIRECT 106 (H) 01/04/2021   TRIG 729 (HH) 06/27/2023   CHOLHDL 6.9 (H) 06/27/2023     5. Type 1 diabetes mellitus with complications (HCC) Fasting blood sugars are running around 110-130. He has stopped his metformin for now. Lab Results  Component Value Date   HGBA1C 6.6 (H) 06/27/2023     6. Attention deficit hyperactivity disorder (ADHD), predominantly inattentive type Is on vyvanse daily so he can concentrate. No medication side effects.  7. Hypokalemia No c/o muscle cramps Lab Results  Component Value Date   K 4.2 06/27/2023     8. BMI 37.0-37.9, adult No recent weight changes Wt Readings from Last 3 Encounters:  06/27/23 237 lb (107.5 kg)  03/28/23 239 lb (108.4 kg)  12/30/22 237 lb (107.5 kg)   BMI Readings from Last 3 Encounters:  06/27/23 38.25 kg/m  03/28/23 38.58 kg/m  12/30/22 37.40 kg/m       New complaints: None today  No Known Allergies Outpatient Encounter Medications as of  10/02/2023  Medication Sig   Accu-Chek FastClix Lancets MISC Test BS QID Dx E10.8   atorvastatin (LIPITOR) 80 MG tablet Take 1 tablet (80 mg total) by mouth daily.   B-D UF III MINI PEN NEEDLES 31G X 5 MM MISC USE WITH INSULIN DAILY DX E10.8   Blood Glucose Monitoring Suppl (ACCU-CHEK GUIDE) w/Device KIT Use to test blood sugars 4 times daily as directed Dx E10.9   Continuous Glucose Sensor (FREESTYLE LIBRE 3 PLUS SENSOR) MISC Change sensor every 15 days.   Continuous Glucose Sensor (FREESTYLE LIBRE 3 SENSOR) MISC Place 1 sensor on the skin every 14 days. Use to check glucose continuously. DX 10.9   fenofibrate (TRICOR) 145 MG tablet Take 1 tablet (145 mg total) by mouth daily.   glucose blood (ACCU-CHEK GUIDE) test strip DX: E10.89   icosapent Ethyl (VASCEPA) 1 g capsule Take 2 capsules (2 g total) by mouth 2 (two) times daily.   insulin glargine, 2 Unit Dial, (TOUJEO MAX SOLOSTAR) 300 UNIT/ML Solostar Pen Inject 70 Units into the skin at bedtime.   insulin lispro (HUMALOG KWIKPEN) 100 UNIT/ML KwikPen Inject 12-20 Units into the skin 3 (three) times daily with meals. Per sliding scale   lisdexamfetamine (VYVANSE) 50 MG capsule Take 1 capsule (50 mg total) by mouth daily.   lisdexamfetamine (VYVANSE) 50 MG capsule Take 1 capsule (50 mg total) by mouth daily.  lisdexamfetamine (VYVANSE) 50 MG capsule Take 1 capsule (50 mg total) by mouth daily.   lisinopril (ZESTRIL) 5 MG tablet Take 1 tablet (5 mg total) by mouth daily.   potassium chloride (KLOR-CON) 10 MEQ tablet Take 1 tablet (10 mEq total) by mouth 2 (two) times daily.   No facility-administered encounter medications on file as of 10/02/2023.    History reviewed. No pertinent surgical history.  Family History  Problem Relation Age of Onset   Kidney disease Father    Diabetes Father    Pancreatitis Father       Controlled substance contract: 10/02/23     Review of Systems  Constitutional:  Negative for diaphoresis.  Eyes:   Negative for pain.  Respiratory:  Negative for shortness of breath.   Cardiovascular:  Negative for chest pain, palpitations and leg swelling.  Gastrointestinal:  Negative for abdominal pain.  Endocrine: Negative for polydipsia.  Skin:  Negative for rash.  Neurological:  Negative for dizziness, weakness and headaches.  Hematological:  Does not bruise/bleed easily.  All other systems reviewed and are negative.      Objective:   Physical Exam Vitals and nursing note reviewed.  Constitutional:      Appearance: Normal appearance. He is well-developed.  HENT:     Head: Normocephalic.     Nose: Nose normal.     Mouth/Throat:     Mouth: Mucous membranes are moist.     Pharynx: Oropharynx is clear.  Eyes:     Pupils: Pupils are equal, round, and reactive to light.  Neck:     Thyroid: No thyroid mass or thyromegaly.     Vascular: No carotid bruit or JVD.     Trachea: Phonation normal.  Cardiovascular:     Rate and Rhythm: Normal rate and regular rhythm.  Pulmonary:     Effort: Pulmonary effort is normal. No respiratory distress.     Breath sounds: Normal breath sounds.  Abdominal:     General: Bowel sounds are normal.     Palpations: Abdomen is soft.     Tenderness: There is no abdominal tenderness.  Musculoskeletal:        General: Normal range of motion.     Cervical back: Normal range of motion and neck supple.  Lymphadenopathy:     Cervical: No cervical adenopathy.  Skin:    General: Skin is warm and dry.  Neurological:     Mental Status: He is alert and oriented to person, place, and time.  Psychiatric:        Behavior: Behavior normal.        Thought Content: Thought content normal.        Judgment: Judgment normal.    BP 135/79   Pulse 87   Temp 97.8 F (36.6 C) (Temporal)   Resp 20   Wt 236 lb (107 kg)   SpO2 95%   BMI 38.09 kg/m         Assessment & Plan:   Stephen Richardson comes in today with chief complaint of Medical Management of Chronic  Issues   Diagnosis and orders addressed:  1. Essential hypertension Low sodium diet - CBC with Differential/Platelet - CMP14+EGFR  2. Congenital aortic valve insufficiency 3. Bicuspid aortic valve Keep follow up with cardiology  4. Hyperlipidemia associated with type 2 diabetes mellitus (HCC) Low fat diet - Lipid panel  5. Type 1 diabetes mellitus with complications (HCC) Continue to watch carbs in diet - Bayer DCA Hb A1c Waived - Microalbumin /  creatinine urine ratio  6. Attention deficit hyperactivity disorder (ADHD), predominantly inattentive type - Drug Screen 10 W/Conf, Se - lisdexamfetamine (VYVANSE) 50 MG capsule; Take 1 capsule (50 mg total) by mouth daily.  Dispense: 30 capsule; Refill: 0 - lisdexamfetamine (VYVANSE) 50 MG capsule; Take 1 capsule (50 mg total) by mouth daily.  Dispense: 30 capsule; Refill: 0 - lisdexamfetamine (VYVANSE) 50 MG capsule; Take 1 capsule (50 mg total) by mouth daily.  Dispense: 30 capsule; Refill: 0  7. Hypokalemia Labs pending  8. BMI 37.0-37.9, adult Discussed diet and exercise for person with BMI >25 Will recheck weight in 3-6 months    Labs pending Health Maintenance reviewed Diet and exercise encouraged  Follow up plan: 3 months   Mary-Margaret Daphine Deutscher, FNP

## 2023-10-02 NOTE — Patient Instructions (Signed)

## 2023-10-04 LAB — CBC WITH DIFFERENTIAL/PLATELET
Basophils Absolute: 0.1 10*3/uL (ref 0.0–0.2)
Basos: 1 %
EOS (ABSOLUTE): 0.1 10*3/uL (ref 0.0–0.4)
Eos: 2 %
Hematocrit: 45.9 % (ref 37.5–51.0)
Hemoglobin: 14.9 g/dL (ref 13.0–17.7)
Immature Grans (Abs): 0 10*3/uL (ref 0.0–0.1)
Immature Granulocytes: 0 %
Lymphocytes Absolute: 2.7 10*3/uL (ref 0.7–3.1)
Lymphs: 34 %
MCH: 28.8 pg (ref 26.6–33.0)
MCHC: 32.5 g/dL (ref 31.5–35.7)
MCV: 89 fL (ref 79–97)
Monocytes Absolute: 0.5 10*3/uL (ref 0.1–0.9)
Monocytes: 7 %
Neutrophils Absolute: 4.5 10*3/uL (ref 1.4–7.0)
Neutrophils: 56 %
Platelets: 329 10*3/uL (ref 150–450)
RBC: 5.18 x10E6/uL (ref 4.14–5.80)
RDW: 13.4 % (ref 11.6–15.4)
WBC: 7.9 10*3/uL (ref 3.4–10.8)

## 2023-10-04 LAB — CMP14+EGFR
ALT: 10 IU/L (ref 0–44)
AST: 14 IU/L (ref 0–40)
Albumin: 4.3 g/dL (ref 4.3–5.2)
Alkaline Phosphatase: 80 IU/L (ref 44–121)
BUN/Creatinine Ratio: 28 — ABNORMAL HIGH (ref 9–20)
BUN: 16 mg/dL (ref 6–20)
Bilirubin Total: 0.5 mg/dL (ref 0.0–1.2)
CO2: 24 mmol/L (ref 20–29)
Calcium: 9.9 mg/dL (ref 8.7–10.2)
Chloride: 100 mmol/L (ref 96–106)
Creatinine, Ser: 0.57 mg/dL — ABNORMAL LOW (ref 0.76–1.27)
Globulin, Total: 2.6 g/dL (ref 1.5–4.5)
Glucose: 73 mg/dL (ref 70–99)
Potassium: 4.3 mmol/L (ref 3.5–5.2)
Sodium: 140 mmol/L (ref 134–144)
Total Protein: 6.9 g/dL (ref 6.0–8.5)
eGFR: 140 mL/min/{1.73_m2} (ref >59)

## 2023-10-04 LAB — DRUG SCREEN 10 W/CONF, SERUM
Amphetamines, IA: NEGATIVE ng/mL
Barbiturates, IA: NEGATIVE ug/mL
Benzodiazepines, IA: NEGATIVE ng/mL
Cocaine & Metabolite, IA: NEGATIVE ng/mL
Methadone, IA: NEGATIVE ng/mL
Opiates, IA: NEGATIVE ng/mL
Oxycodones, IA: NEGATIVE ng/mL
Phencyclidine, IA: NEGATIVE ng/mL
Propoxyphene, IA: NEGATIVE ng/mL
THC(Marijuana) Metabolite, IA: NEGATIVE ng/mL

## 2023-10-04 LAB — LIPID PANEL
Chol/HDL Ratio: 7.8 ratio — ABNORMAL HIGH (ref 0.0–5.0)
Cholesterol, Total: 226 mg/dL — ABNORMAL HIGH (ref 100–199)
HDL: 29 mg/dL — ABNORMAL LOW (ref >39)
LDL Chol Calc (NIH): 98 mg/dL (ref 0–99)
Triglycerides: 584 mg/dL (ref 0–149)
VLDL Cholesterol Cal: 99 mg/dL — ABNORMAL HIGH (ref 5–40)

## 2023-11-07 ENCOUNTER — Ambulatory Visit (INDEPENDENT_AMBULATORY_CARE_PROVIDER_SITE_OTHER): Payer: Medicaid Other | Admitting: Pharmacist

## 2023-11-07 VITALS — BP 137/84 | HR 74

## 2023-11-07 DIAGNOSIS — E108 Type 1 diabetes mellitus with unspecified complications: Secondary | ICD-10-CM | POA: Diagnosis not present

## 2023-11-07 DIAGNOSIS — I10 Essential (primary) hypertension: Secondary | ICD-10-CM

## 2023-11-07 NOTE — Patient Instructions (Addendum)
It was great to see you today!   Since your blood pressure has been running a little higher, please increase your lisinopril to 10 mg once daily. You can use up your 5 mg tablets by taking two (2) tablets daily. We will send in a new prescription for the 10 mg tablet for you to pick up next time.   Continue your once daily insulin Toujeo 70 units once daily and meal time insulin Humalog 20 units with meals. We will ask the pharmacy to fill a three month supply of your insulin next time.   Keep using your Quincy Simmonds sensor to monitor your blood sugar.   Continue taking all your cholesterol medications as prescribed.   Remember to drink water before you next appt with Gennette Pac in January, so that you can do the urine test that she ordered.

## 2023-11-07 NOTE — Progress Notes (Signed)
11/07/2023 Name: Stephen Richardson MRN: [id number] DOB: 1996-03-17  Chief Complaint  Patient presents with   Diabetes Mellitus   Hypertension   Hyperlipidemia    Stephen Richardson is a 27 y.o. year old male who presented for a face to face visit.   They were referred to the pharmacist by their PCP for assistance in managing diabetes. PMH includes congenital aortic valve insufficiency, HTN, HLD with familial hypertriglyceridemia, T1DM, ADHD, obesity.   Subjective: At last pharmacist visit on 09/05/23 - pt was doing well, but had not been wearing his CGM. We discontinued his metformin as it was likely providing minimal benefit in T1DM. Transitioned him form Lantus to concentrated basal insulin Toujeo U300. Pt was seen by his PCP on 10/02/23 - A1c 6.8%, TG improved to 584 mg/dL, UACR was ordered, but not drawn - previously elevated to 736 mg/g.  Today patient reports doing well. He had a recent gap in using his CGM as he had not picked it up from the pharmacy in October. Feels that his blood sugars have been spiking occasionally but overall doing pretty well. No hypoglycemia. States that he feels better since stopping the metformin.   Care Team: Primary Care Provider: Bennie Pierini, FNP ; Next Scheduled Visit: 12/25/23  Medication Access/Adherence  [facility]  [phone] [place] [place] - [address] [place] [address] Phone: [phone] Fax: [phone]  [facility] [place] - [address] [place] [address] Phone: [phone] Fax: [phone]   Patient reports affordability concerns with their medications: No  Patient reports access/transportation concerns to their pharmacy: No  Patient reports adherence concerns with their medications:  Yes  Works Mon, Tues, Wed - usually forgets to take his medications on those days. Will go out to eat after working. Hours are between 5AM-8AM, varies each  day.   Denies missed doses of his inulin. Misses AM doses of his oral medications a couple times/week - especially when he goes to work (three times a week).   Fill hx discrepancies:  Lisinopril 5 mg- last filled 90ds on 7/27 Fenofibrate 145 mg- last filled 90ds on 7/27 Atorvastatin 80 mg - last filled 90ds on 7/27  Diabetes:  Current medications: insulin glargine (Toujeo U300) 70 units at bedtime, insulin lispro (Humalog) 10-20 units with meals (tends to do 20 units if BG is >200, 10 units if BG is lower- but this varies with his BG and meal choice)  He is liking the Toujeo - reports that he noticed having a mild headache after injecting Toujeo, but still would like to stay with this formulation as the pen lasts longer.   Current glucose readings: frequently 200 in the afternoons after eating, no low BG Has Accu-Chek meter as backup.   Date of Download: 11/13-11/26       Patient denies hypoglycemia symptoms including dizziness, shakiness, sweatiness. Reports hyperglycemic symptoms including neuropathy when BG is 230s. No blurry vision. Occasional polydipsia, polyphagia, and nocturia.   Current meal patterns:  - Breakfast: eggs, bacon - has cut back on biscuits. Typically taking 10 units with this meal. - Lunch: [employer] if he goes to work (sausage- takes bread off of biscuit) - not eating out everyday.  - Supper: Variable - hot dogs, hamburgers without the bread - Drinks: drinks 2 gallons of water/day. Occasionally sweet tea.   Current physical activity: Walking, working  Current medication access support: Medicaid, disability  Hyperlipidemia/ASCVD Risk Reduction  Current lipid lowering medications: atorvastatin 80 mg daily, Vascepa (icosapent ethyl) 2g BID, fenofibrate 145 mg daily  Objective:  Lab Results  Component Value Date   HGBA1C 6.8 (H) 10/02/2023    Lab Results  Component Value Date   CREATININE 0.57 (L) 10/02/2023   BUN 16 10/02/2023   NA 140 10/02/2023    K 4.3 10/02/2023   CL 100 10/02/2023   CO2 24 10/02/2023    Lab Results  Component Value Date   CHOL 226 (H) 10/02/2023   HDL 29 (L) 10/02/2023   LDLCALC 98 10/02/2023   LDLDIRECT 106 (H) 01/04/2021   TRIG 584 (HH) 10/02/2023   CHOLHDL 7.8 (H) 10/02/2023    Medications Reviewed Today   Medications were not reviewed in this encounter     Assessment/Plan:   Diabetes: - Currently controlled per most recent A1c of 6.8% below goal <7%, though TIR of 54% below goal >70% based on limited CGM data. Pt is adherent to long-acting insulin, reports decreasing # of missed meal time doses of insulin. Discussed importance of taking insulin before or at the beginning of each meal to prevent BG spike. Anticipate that regular use of CGM will improve BG control.  - Reviewed long term cardiovascular and renal outcomes of uncontrolled blood sugar - Reviewed goal A1c, goal fasting, and goal 2 hour post prandial glucose - Recommend to continue insulin glargine (Toujeo) 70 units daily at bedtime - Recommend to continue insulin lispro (Humalog) 10-20 units with EVERY meal. . - Recommend to check glucose continuously with CGM. - Resent Toujeo to pharmacy to request fill for 3 mo supply.    Hypertension: - Currently uncontrolled with clinic BP above goal < 130/80 mmHg on low dose lisinopril. UACR in 2023 elevated to 736 mg/g, encouraged pt to obtain repeat UACR today though he declined. Appropriate to titrate ACEi given DM, elevated BP, and microalbuminuria.  - Reviewed long term cardiovascular and renal outcomes of uncontrolled blood pressure - Recommend to increase lisinopril to 10 mg PO daily - can take 2x 5 mg tabs until supply runs out then start 10 mg tablet once daily   - Recommend repeat BMP at PCP in Jan to monitor Scr and K - may need to discontinue potassium supplement.   .Hyperlipidemia/ASCVD Risk Reduction: - Currently uncontrolled with TG 584, improved from 584 (suspected familial  hypertriglyceridemia). Pt on maximum statin and fibrate. Would recommend drawing direct-LDL to assess CVD risk.  - Recommend to continue current regimen   Follow Up Plan: PCP 12/25/23, Pharmacist in person 02/06/24  Nils Pyle, PharmD PGY1 Pharmacy Resident  Kieth Brightly, PharmD, BCACP Clinical Pharmacist, [facility]

## 2023-11-13 MED ORDER — TOUJEO MAX SOLOSTAR 300 UNIT/ML ~~LOC~~ SOPN: 70.0000 [IU] | PEN_INJECTOR | Freq: Every day | SUBCUTANEOUS | 5 refills | Status: DC

## 2023-11-13 MED ORDER — LISINOPRIL 10 MG PO TABS: 10.0000 mg | ORAL_TABLET | Freq: Every day | ORAL | 3 refills | Status: DC

## 2023-12-25 ENCOUNTER — Ambulatory Visit: Payer: Medicaid Other | Admitting: Nurse Practitioner

## 2023-12-25 NOTE — Progress Notes (Deleted)
 Subjective:    Patient ID: Stephen Richardson, male    DOB: 13-Jul-1996, 28 y.o.   MRN: [id number]   Chief Complaint: medical management of chronic issues     HPI:  Stephen Richardson is a 28 y.o. who identifies as a male who was assigned male at birth.   Social history: Lives with: mom Work history: works for a [employer] in today for follow up of the following chronic medical issues:  1. Essential hypertension No c/o chest pain, sob or headache. Does not check blood pressure at home. BP Readings from Last 3 Encounters:  11/07/23 137/84  10/02/23 135/79  06/27/23 (!) 149/88     2. Congenital aortic valve insufficiency 3. Bicuspid aortic valve Is suppose to be in a research study for his heart. He thinks he still is!  4. Hyperlipidemia associated with type 2 diabetes mellitus (HCC) Does not watch diet and does very little exercise. Lab Results  Component Value Date   CHOL 226 (H) 10/02/2023   HDL 29 (L) 10/02/2023   LDLCALC 98 10/02/2023   LDLDIRECT 106 (H) 01/04/2021   TRIG 584 (HH) 10/02/2023   CHOLHDL 7.8 (H) 10/02/2023     5. Type 1 diabetes mellitus with complications (HCC) Fasting blood sugars are running around 110-130. He has stopped his metformin  for now. Lab Results  Component Value Date   HGBA1C 6.8 (H) 10/02/2023     6. Attention deficit hyperactivity disorder (ADHD), predominantly inattentive type Is on vyvanse  daily so he can concentrate. No medication side effects.  7. Hypokalemia No c/o muscle cramps Lab Results  Component Value Date   K 4.3 10/02/2023     8. BMI 37.0-37.9, adult No recent weight changes Wt Readings from Last 3 Encounters:  10/02/23 236 lb (107 kg)  06/27/23 237 lb (107.5 kg)  03/28/23 239 lb (108.4 kg)   BMI Readings from Last 3 Encounters:  10/02/23 38.09 kg/m  06/27/23 38.25 kg/m  03/28/23 38.58 kg/m       New complaints: None today  No Known Allergies Outpatient Encounter  Medications as of 12/25/2023  Medication Sig   Accu-Chek FastClix Lancets MISC Test BS QID Dx E10.8   atorvastatin  (LIPITOR) 80 MG tablet Take 1 tablet (80 mg total) by mouth daily.   B-D UF III MINI PEN NEEDLES 31G X 5 MM MISC USE WITH INSULIN  DAILY DX E10.8   Blood Glucose Monitoring Suppl (ACCU-CHEK GUIDE) w/Device KIT Use to test blood sugars 4 times daily as directed Dx E10.9   Continuous Glucose Sensor (FREESTYLE LIBRE 3 PLUS SENSOR) MISC Change sensor every 15 days.   fenofibrate  (TRICOR ) 145 MG tablet Take 1 tablet (145 mg total) by mouth daily.   glucose blood (ACCU-CHEK GUIDE) test strip DX: E10.89   icosapent  Ethyl (VASCEPA ) 1 g capsule Take 2 capsules (2 g total) by mouth 2 (two) times daily.   insulin  glargine, 2 Unit Dial , (TOUJEO  MAX SOLOSTAR) 300 UNIT/ML Solostar Pen Inject 70 Units into the skin at bedtime.   insulin  lispro (HUMALOG  KWIKPEN) 100 UNIT/ML KwikPen Inject 12-20 Units into the skin 3 (three) times daily with meals. Per sliding scale   lisdexamfetamine (VYVANSE ) 50 MG capsule Take 1 capsule (50 mg total) by mouth daily.   lisdexamfetamine (VYVANSE ) 50 MG capsule Take 1 capsule (50 mg total) by mouth daily.   lisdexamfetamine (VYVANSE ) 50 MG capsule Take 1 capsule (50 mg total) by mouth daily.   lisinopril  (ZESTRIL ) 10 MG tablet Take 1  tablet (10 mg total) by mouth daily.   potassium chloride  (KLOR-CON ) 10 MEQ tablet Take 1 tablet (10 mEq total) by mouth 2 (two) times daily.   No facility-administered encounter medications on file as of 12/25/2023.    No past surgical history on file.  Family History  Problem Relation Age of Onset   Kidney disease Father    Diabetes Father    Pancreatitis Father       Controlled substance contract: 10/02/23     Review of Systems  Constitutional:  Negative for diaphoresis.  Eyes:  Negative for pain.  Respiratory:  Negative for shortness of breath.   Cardiovascular:  Negative for chest pain, palpitations and leg  swelling.  Gastrointestinal:  Negative for abdominal pain.  Endocrine: Negative for polydipsia.  Skin:  Negative for rash.  Neurological:  Negative for dizziness, weakness and headaches.  Hematological:  Does not bruise/bleed easily.  All other systems reviewed and are negative.      Objective:   Physical Exam Vitals and nursing note reviewed.  Constitutional:      Appearance: Normal appearance. He is well-developed.  HENT:     Head: Normocephalic.     Nose: Nose normal.     Mouth/Throat:     Mouth: Mucous membranes are moist.     Pharynx: Oropharynx is clear.  Eyes:     Pupils: Pupils are equal, round, and reactive to light.  Neck:     Thyroid: No thyroid mass or thyromegaly.     Vascular: No carotid bruit or JVD.     Trachea: Phonation normal.  Cardiovascular:     Rate and Rhythm: Normal rate and regular rhythm.  Pulmonary:     Effort: Pulmonary effort is normal. No respiratory distress.     Breath sounds: Normal breath sounds.  Abdominal:     General: Bowel sounds are normal.     Palpations: Abdomen is soft.     Tenderness: There is no abdominal tenderness.  Musculoskeletal:        General: Normal range of motion.     Cervical back: Normal range of motion and neck supple.  Lymphadenopathy:     Cervical: No cervical adenopathy.  Skin:    General: Skin is warm and dry.  Neurological:     Mental Status: He is alert and oriented to person, place, and time.  Psychiatric:        Behavior: Behavior normal.        Thought Content: Thought content normal.        Judgment: Judgment normal.    There were no vitals taken for this visit.        Assessment & Plan:   Stephen Richardson comes in today with chief complaint of No chief complaint on file.   Diagnosis and orders addressed:  1. Essential hypertension Low sodium diet - CBC with Differential/Platelet - CMP14+EGFR  2. Congenital aortic valve insufficiency 3. Bicuspid aortic valve Keep follow up with  cardiology  4. Hyperlipidemia associated with type 2 diabetes mellitus (HCC) Low fat diet - Lipid panel  5. Type 1 diabetes mellitus with complications (HCC) Continue to watch carbs in diet - Bayer DCA Hb A1c Waived - Microalbumin / creatinine urine ratio  6. Attention deficit hyperactivity disorder (ADHD), predominantly inattentive type - Drug Screen 10 W/Conf, Se - lisdexamfetamine (VYVANSE ) 50 MG capsule; Take 1 capsule (50 mg total) by mouth daily.  Dispense: 30 capsule; Refill: 0 - lisdexamfetamine (VYVANSE ) 50 MG capsule; Take 1 capsule (50  mg total) by mouth daily.  Dispense: 30 capsule; Refill: 0 - lisdexamfetamine (VYVANSE ) 50 MG capsule; Take 1 capsule (50 mg total) by mouth daily.  Dispense: 30 capsule; Refill: 0  7. Hypokalemia Labs pending  8. BMI 37.0-37.9, adult Discussed diet and exercise for person with BMI >25 Will recheck weight in 3-6 months    Labs pending Health Maintenance reviewed Diet and exercise encouraged  Follow up plan: 3 months   Mary-Margaret Gladis, FNP

## 2024-01-02 ENCOUNTER — Ambulatory Visit: Payer: Medicaid Other | Admitting: Nurse Practitioner

## 2024-01-02 ENCOUNTER — Encounter: Payer: Self-pay | Admitting: Nurse Practitioner

## 2024-01-02 VITALS — BP 148/92 | HR 82 | Temp 97.9°F | Ht 66.0 in | Wt 245.0 lb

## 2024-01-02 DIAGNOSIS — E108 Type 1 diabetes mellitus with unspecified complications: Secondary | ICD-10-CM | POA: Diagnosis not present

## 2024-01-02 DIAGNOSIS — E785 Hyperlipidemia, unspecified: Secondary | ICD-10-CM | POA: Diagnosis not present

## 2024-01-02 DIAGNOSIS — Q231 Congenital insufficiency of aortic valve: Secondary | ICD-10-CM | POA: Diagnosis not present

## 2024-01-02 DIAGNOSIS — E876 Hypokalemia: Secondary | ICD-10-CM

## 2024-01-02 DIAGNOSIS — Q2381 Bicuspid aortic valve: Secondary | ICD-10-CM

## 2024-01-02 DIAGNOSIS — E1169 Type 2 diabetes mellitus with other specified complication: Secondary | ICD-10-CM | POA: Diagnosis not present

## 2024-01-02 DIAGNOSIS — I10 Essential (primary) hypertension: Secondary | ICD-10-CM | POA: Diagnosis not present

## 2024-01-02 DIAGNOSIS — Z6839 Body mass index (BMI) 39.0-39.9, adult: Secondary | ICD-10-CM | POA: Diagnosis not present

## 2024-01-02 DIAGNOSIS — F900 Attention-deficit hyperactivity disorder, predominantly inattentive type: Secondary | ICD-10-CM | POA: Diagnosis not present

## 2024-01-02 LAB — BAYER DCA HB A1C WAIVED: HB A1C (BAYER DCA - WAIVED): 7.3 % — ABNORMAL HIGH (ref 4.8–5.6)

## 2024-01-02 MED ORDER — LISDEXAMFETAMINE DIMESYLATE 50 MG PO CAPS: 50.0000 mg | ORAL_CAPSULE | Freq: Every day | ORAL | 0 refills | Status: DC

## 2024-01-02 MED ORDER — ICOSAPENT ETHYL 1 G PO CAPS: 2.0000 g | ORAL_CAPSULE | Freq: Two times a day (BID) | ORAL | 1 refills | Status: DC

## 2024-01-02 MED ORDER — FENOFIBRATE 145 MG PO TABS: 145.0000 mg | ORAL_TABLET | Freq: Every day | ORAL | 1 refills | Status: DC

## 2024-01-02 MED ORDER — ATORVASTATIN CALCIUM 80 MG PO TABS
80.0000 mg | ORAL_TABLET | Freq: Every day | ORAL | 1 refills | Status: DC
Start: 2024-01-02 — End: 2024-04-01

## 2024-01-02 MED ORDER — POTASSIUM CHLORIDE ER 10 MEQ PO TBCR: 10.0000 meq | EXTENDED_RELEASE_TABLET | Freq: Two times a day (BID) | ORAL | 1 refills | Status: DC

## 2024-01-02 NOTE — Patient Instructions (Signed)

## 2024-01-02 NOTE — Progress Notes (Signed)
Subjective:    Patient ID: Stephen Richardson, male    DOB: September 03, 1996, 28 y.o.   MRN: [id number]   Chief Complaint: medical management of chronic issues     HPI:  Stephen Richardson is a 74 y.o. who identifies as a male who was assigned male at birth.   Social history: Lives with: mom Work history: works for a [employer] in today for follow up of the following chronic medical issues:  1. Essential hypertension No c/o chest pain, sob or headache. Does not check blood pressure at home. BP Readings from Last 3 Encounters:  11/07/23 137/84  10/02/23 135/79  06/27/23 (!) 149/88     2. Congenital aortic valve insufficiency 3. Bicuspid aortic valve Is suppose to be in a research study for his heart. He thinks he still is!  4. Hyperlipidemia associated with type 2 diabetes mellitus (HCC) Does not watch diet and does very little exercise. Lab Results  Component Value Date   CHOL 226 (H) 10/02/2023   HDL 29 (L) 10/02/2023   LDLCALC 98 10/02/2023   LDLDIRECT 106 (H) 01/04/2021   TRIG 584 (HH) 10/02/2023   CHOLHDL 7.8 (H) 10/02/2023     5. Type 1 diabetes mellitus with complications (HCC) Fasting blood sugars are running around 99-150. He has stopped his metformin for now. Lab Results  Component Value Date   HGBA1C 6.8 (H) 10/02/2023     6. Attention deficit hyperactivity disorder (ADHD), predominantly inattentive type Is on vyvanse daily so he can concentrate. No medication side effects.  7. Hypokalemia No c/o muscle cramps Lab Results  Component Value Date   K 4.3 10/02/2023     8. BMI 38.0-38.9, adult Weight is up 9lbs  Wt Readings from Last 3 Encounters:  01/02/24 245 lb (111.1 kg)  10/02/23 236 lb (107 kg)  06/27/23 237 lb (107.5 kg)   BMI Readings from Last 3 Encounters:  01/02/24 39.54 kg/m  10/02/23 38.09 kg/m  06/27/23 38.25 kg/m        New complaints: None today  No Known Allergies Outpatient Encounter Medications  as of 01/02/2024  Medication Sig   Accu-Chek FastClix Lancets MISC Test BS QID Dx E10.8   atorvastatin (LIPITOR) 80 MG tablet Take 1 tablet (80 mg total) by mouth daily.   B-D UF III MINI PEN NEEDLES 31G X 5 MM MISC USE WITH INSULIN DAILY DX E10.8   Blood Glucose Monitoring Suppl (ACCU-CHEK GUIDE) w/Device KIT Use to test blood sugars 4 times daily as directed Dx E10.9   Continuous Glucose Sensor (FREESTYLE LIBRE 3 PLUS SENSOR) MISC Change sensor every 15 days.   fenofibrate (TRICOR) 145 MG tablet Take 1 tablet (145 mg total) by mouth daily.   glucose blood (ACCU-CHEK GUIDE) test strip DX: E10.89   icosapent Ethyl (VASCEPA) 1 g capsule Take 2 capsules (2 g total) by mouth 2 (two) times daily.   insulin glargine, 2 Unit Dial, (TOUJEO MAX SOLOSTAR) 300 UNIT/ML Solostar Pen Inject 70 Units into the skin at bedtime.   insulin lispro (HUMALOG KWIKPEN) 100 UNIT/ML KwikPen Inject 12-20 Units into the skin 3 (three) times daily with meals. Per sliding scale   lisdexamfetamine (VYVANSE) 50 MG capsule Take 1 capsule (50 mg total) by mouth daily.   lisdexamfetamine (VYVANSE) 50 MG capsule Take 1 capsule (50 mg total) by mouth daily.   lisdexamfetamine (VYVANSE) 50 MG capsule Take 1 capsule (50 mg total) by mouth daily.   lisinopril (ZESTRIL) 10 MG tablet  Take 1 tablet (10 mg total) by mouth daily.   potassium chloride (KLOR-CON) 10 MEQ tablet Take 1 tablet (10 mEq total) by mouth 2 (two) times daily.   No facility-administered encounter medications on file as of 01/02/2024.    No past surgical history on file.  Family History  Problem Relation Age of Onset   Kidney disease Father    Diabetes Father    Pancreatitis Father       Controlled substance contract: 10/02/23     Review of Systems  Constitutional:  Negative for diaphoresis.  Eyes:  Negative for pain.  Respiratory:  Negative for shortness of breath.   Cardiovascular:  Negative for chest pain, palpitations and leg swelling.   Gastrointestinal:  Negative for abdominal pain.  Endocrine: Negative for polydipsia.  Skin:  Negative for rash.  Neurological:  Negative for dizziness, weakness and headaches.  Hematological:  Does not bruise/bleed easily.  All other systems reviewed and are negative.      Objective:   Physical Exam Vitals and nursing note reviewed.  Constitutional:      Appearance: Normal appearance. He is well-developed.  HENT:     Head: Normocephalic.     Nose: Nose normal.     Mouth/Throat:     Mouth: Mucous membranes are moist.     Pharynx: Oropharynx is clear.  Eyes:     Pupils: Pupils are equal, round, and reactive to light.  Neck:     Thyroid: No thyroid mass or thyromegaly.     Vascular: No carotid bruit or JVD.     Trachea: Phonation normal.  Cardiovascular:     Rate and Rhythm: Normal rate and regular rhythm.  Pulmonary:     Effort: Pulmonary effort is normal. No respiratory distress.     Breath sounds: Normal breath sounds.  Abdominal:     General: Bowel sounds are normal.     Palpations: Abdomen is soft.     Tenderness: There is no abdominal tenderness.  Musculoskeletal:        General: Normal range of motion.     Cervical back: Normal range of motion and neck supple.  Lymphadenopathy:     Cervical: No cervical adenopathy.  Skin:    General: Skin is warm and dry.  Neurological:     Mental Status: He is alert and oriented to person, place, and time.  Psychiatric:        Behavior: Behavior normal.        Thought Content: Thought content normal.        Judgment: Judgment normal.    BP (!) 148/92   Pulse 82   Temp 97.9 F (36.6 C) (Temporal)   Ht 5\' 6"  (1.676 m)   Wt 245 lb (111.1 kg)   SpO2 98%   BMI 39.54 kg/m   HJGBA1c 7.3%       Assessment & Plan:   Stephen Richardson comes in today with chief complaint of No chief complaint on file.   Diagnosis and orders addressed:  1. Essential hypertension Low sodium diet - CBC with Differential/Platelet -  CMP14+EGFR  2. Congenital aortic valve insufficiency 3. Bicuspid aortic valve Keep follow up with cardiology  4. Hyperlipidemia associated with type 2 diabetes mellitus (HCC) Low fat diet - Lipid panel  5. Type 1 diabetes mellitus with complications (HCC) Continue to watch carbs in diet - Bayer DCA Hb A1c Waived - Microalbumin / creatinine urine ratio  6. Attention deficit hyperactivity disorder (ADHD), predominantly inattentive type - Drug  Screen 10 W/Conf, Se - lisdexamfetamine (VYVANSE) 50 MG capsule; Take 1 capsule (50 mg total) by mouth daily.  Dispense: 30 capsule; Refill: 0 - lisdexamfetamine (VYVANSE) 50 MG capsule; Take 1 capsule (50 mg total) by mouth daily.  Dispense: 30 capsule; Refill: 0 - lisdexamfetamine (VYVANSE) 50 MG capsule; Take 1 capsule (50 mg total) by mouth daily.  Dispense: 30 capsule; Refill: 0  7. Hypokalemia Labs pending  8. BMI 37.0-37.9, adult Discussed diet and exercise for person with BMI >25 Will recheck weight in 3-6 months    Labs pending Health Maintenance reviewed Diet and exercise encouraged  Follow up plan: 3 months   Mary-Margaret Daphine Deutscher, FNP

## 2024-01-03 LAB — CMP14+EGFR
ALT: 11 IU/L (ref 0–44)
AST: 16 IU/L (ref 0–40)
Albumin: 4.5 g/dL (ref 4.3–5.2)
Alkaline Phosphatase: 88 IU/L (ref 44–121)
BUN/Creatinine Ratio: 31 — ABNORMAL HIGH (ref 9–20)
BUN: 15 mg/dL (ref 6–20)
Bilirubin Total: 0.5 mg/dL (ref 0.0–1.2)
CO2: 24 mmol/L (ref 20–29)
Calcium: 9.9 mg/dL (ref 8.7–10.2)
Chloride: 95 mmol/L — ABNORMAL LOW (ref 96–106)
Creatinine, Ser: 0.48 mg/dL — ABNORMAL LOW (ref 0.76–1.27)
Globulin, Total: 2.3 g/dL (ref 1.5–4.5)
Glucose: 119 mg/dL — ABNORMAL HIGH (ref 70–99)
Potassium: 4 mmol/L (ref 3.5–5.2)
Sodium: 134 mmol/L (ref 134–144)
Total Protein: 6.8 g/dL (ref 6.0–8.5)
eGFR: 146 mL/min/{1.73_m2} (ref >59)

## 2024-01-03 LAB — CBC WITH DIFFERENTIAL/PLATELET
Basophils Absolute: 0.1 10*3/uL (ref 0.0–0.2)
Basos: 1 %
EOS (ABSOLUTE): 0.1 10*3/uL (ref 0.0–0.4)
Eos: 1 %
Hematocrit: 48.4 % (ref 37.5–51.0)
Hemoglobin: 16.1 g/dL (ref 13.0–17.7)
Immature Grans (Abs): 0 10*3/uL (ref 0.0–0.1)
Immature Granulocytes: 0 %
Lymphocytes Absolute: 2.7 10*3/uL (ref 0.7–3.1)
Lymphs: 39 %
MCH: 30 pg (ref 26.6–33.0)
MCHC: 33.3 g/dL (ref 31.5–35.7)
MCV: 90 fL (ref 79–97)
Monocytes Absolute: 0.4 10*3/uL (ref 0.1–0.9)
Monocytes: 6 %
Neutrophils Absolute: 3.6 10*3/uL (ref 1.4–7.0)
Neutrophils: 53 %
Platelets: 322 10*3/uL (ref 150–450)
RBC: 5.37 x10E6/uL (ref 4.14–5.80)
RDW: 13.7 % (ref 11.6–15.4)
WBC: 6.8 10*3/uL (ref 3.4–10.8)

## 2024-01-03 LAB — LIPID PANEL
Chol/HDL Ratio: 10 ratio — ABNORMAL HIGH (ref 0.0–5.0)
Cholesterol, Total: 320 mg/dL — ABNORMAL HIGH (ref 100–199)
HDL: 32 mg/dL — ABNORMAL LOW (ref >39)
Triglycerides: 1005 mg/dL (ref 0–149)

## 2024-01-03 LAB — MICROALBUMIN / CREATININE URINE RATIO
Creatinine, Urine: 58.8 mg/dL
Microalb/Creat Ratio: 2197 mg/g{creat} — ABNORMAL HIGH (ref 0–29)
Microalbumin, Urine: 1291.9 ug/mL

## 2024-02-06 ENCOUNTER — Ambulatory Visit: Payer: Medicaid Other | Admitting: Pharmacist

## 2024-02-06 VITALS — BP 143/100 | HR 86

## 2024-02-06 DIAGNOSIS — I10 Essential (primary) hypertension: Secondary | ICD-10-CM | POA: Diagnosis not present

## 2024-02-06 DIAGNOSIS — E108 Type 1 diabetes mellitus with unspecified complications: Secondary | ICD-10-CM | POA: Diagnosis not present

## 2024-02-06 DIAGNOSIS — Z794 Long term (current) use of insulin: Secondary | ICD-10-CM | POA: Diagnosis not present

## 2024-02-06 MED ORDER — LISINOPRIL 20 MG PO TABS: 20.0000 mg | ORAL_TABLET | Freq: Every day | ORAL | 3 refills | Status: DC

## 2024-02-06 NOTE — Progress Notes (Signed)
 02/06/2024 Name: Stephen Richardson MRN: [id number] DOB: 1996/12/03  Chief Complaint  Patient presents with   Diabetes   Hyperlipidemia   Hypertension    Stephen Richardson is a 28 y.o. year old male who was referred for medication management by their primary care provider, Bennie Pierini, FNP. They presented for a face to face visit today.   They were referred to the pharmacist by their PCP for assistance in managing diabetes, hypertension, and hyperlipidemia    Subjective:  Care Team: Primary Care Provider: Bennie Pierini, FNP ; Next Scheduled Visit: 03/22/2024  Medication Access/Adherence  Current Pharmacy:  [facility] [phone] - [place], [place] - [address] [place] [address] Phone: [phone] Fax: [phone]   Patient reports affordability concerns with their medications: No  Patient reports access/transportation concerns to their pharmacy: No  Patient reports adherence concerns with their medications:  No     Diabetes:  Current medications: Toujeo 70 units daily, Humalog 12-20 units TID with meals (injects 20 units if BG >200 and 12 units if ~100)  Has had some issues with CGM adhesion, but overall time CGM active has improved. States he is trying to lose weight.  Date of Download: 02/06/2024 - Libre 3 plus % Time CGM is active: 76% Average Glucose: 155 mg/dL Glucose Management Indicator: 7.0  Glucose Variability: 32.9 (goal <36%) Time in Goal:  - Time in range 70-180: 70% - Time above range: 29% - Time below range: 1% Observed patterns: mostly in target range with some post prandial hyperglycemia  Patient denies hypoglycemic s/sx including dizziness, shakiness, sweating. Per Josephine Igo one incidence of hypoglycemia, but patient denied symptoms during event. Appropriately treated with orange juice. Thinks this was due to skipping a meal that day. Patient denies hyperglycemic symptoms including polyuria, polydipsia, polyphagia,  nocturia, neuropathy, blurred vision.   Current meal patterns:  - Breakfast: eggs, small amount of bacon, avocado - Lunch: egg sandwich - Supper: meat usually chicken, vegetable  - Snacks: no snacks, has cut out bread - Drinks: only water, diet coke  - Breakfast: eggs, bacon - has cut back on biscuits. Typically taking 10 units with this meal. - Lunch: [employer] if he goes to work (sausage- takes bread off of biscuit) - not eating out everyday.  - Supper: Variable - hot dogs, hamburgers without the bread - Drinks: drinks 2 gallons of water/day. Occasionally sweet tea.   Current physical activity: has started walking 5 miles every day   Hypertension:  Current medications: lisinopril 10 mg daily  Inc lisinopril to 20 mg  Patient is not checking BP at home. Reports he has been having headaches over the past few weeks. Takes tylenol with some relief.  Patient denies hypotensive s/sx including dizziness, lightheadedness.  Patient denies hypertensive symptoms including headache, chest pain, shortness of breath.   Hyperlipidemia/ASCVD Risk Reduction  Current lipid lowering medications: fenofibrate 145 mg daily, atorvastatin 80 mg daily, Vascepa - 2 capsules BID    Objective:  Vitals:   02/06/24 1335  BP: (!) 143/100  Pulse: 86    Lab Results  Component Value Date   HGBA1C 7.3 (H) 01/02/2024    Lab Results  Component Value Date   CREATININE 0.48 (L) 01/02/2024   BUN 15 01/02/2024   NA 134 01/02/2024   K 4.0 01/02/2024   CL 95 (L) 01/02/2024   CO2 24 01/02/2024    Lab Results  Component Value Date   CHOL 320 (H) 01/02/2024   HDL 32 (L) 01/02/2024  LDLCALC Comment (A) 01/02/2024   LDLDIRECT 106 (H) 01/04/2021   TRIG 1,005 (HH) 01/02/2024   CHOLHDL 10.0 (H) 01/02/2024    Medications Reviewed Today     Reviewed by Vela Prose, RPH (Pharmacist) on 02/06/24 at 1513  Med List Status: <None>   Medication Order Taking? Sig Documenting Provider Last Dose  Status Informant  Accu-Chek FastClix Lancets MISC [id number] No Test BS QID Dx E10.8 Bennie Pierini, FNP Taking Active   atorvastatin (LIPITOR) 80 MG tablet [id number]  Take 1 tablet (80 mg total) by mouth daily. Bennie Pierini, FNP  Active   B-D UF III MINI PEN NEEDLES 31G X 5 MM MISC [id number] No USE WITH INSULIN DAILY DX E10.8 Bennie Pierini, FNP Taking Active   Blood Glucose Monitoring Suppl (ACCU-CHEK GUIDE) w/Device KIT [id number] No Use to test blood sugars 4 times daily as directed Dx E10.9 Bennie Pierini, FNP Taking Active   Continuous Glucose Sensor (FREESTYLE LIBRE 3 PLUS SENSOR) MISC [id number] No Change sensor every 15 days. Daphine Deutscher, Mary-Margaret, FNP Taking Active   fenofibrate (TRICOR) 145 MG tablet [id number]  Take 1 tablet (145 mg total) by mouth daily. Daphine Deutscher, Mary-Margaret, FNP  Active   glucose blood (ACCU-CHEK GUIDE) test strip [id number] No DX: E10.89 Bennie Pierini, FNP Taking Active   icosapent Ethyl (VASCEPA) 1 g capsule [id number]  Take 2 capsules (2 g total) by mouth 2 (two) times daily. Daphine Deutscher, Mary-Margaret, FNP  Active   insulin glargine, 2 Unit Dial, (TOUJEO MAX SOLOSTAR) 300 UNIT/ML Solostar Pen [id number] No Inject 70 Units into the skin at bedtime. Daphine Deutscher Mary-Margaret, FNP Taking Active   insulin lispro (HUMALOG KWIKPEN) 100 UNIT/ML KwikPen [id number] No Inject 12-20 Units into the skin 3 (three) times daily with meals. Per sliding scale Bennie Pierini, FNP Taking Active   lisdexamfetamine (VYVANSE) 50 MG capsule [id number]  Take 1 capsule (50 mg total) by mouth daily. Daphine Deutscher, Mary-Margaret, FNP  Expired 02/01/24 2359   lisdexamfetamine (VYVANSE) 50 MG capsule [id number]  Take 1 capsule (50 mg total) by mouth daily. Daphine Deutscher, Mary-Margaret, FNP  Active   lisdexamfetamine (VYVANSE) 50 MG capsule [id number]  Take 1 capsule (50 mg total) by mouth daily. Daphine Deutscher, Mary-Margaret, FNP  Active   lisinopril (ZESTRIL) 20 MG tablet [id number] Yes Take  1 tablet (20 mg total) by mouth daily. Daphine Deutscher, Mary-Margaret, FNP  Active   potassium chloride (KLOR-CON) 10 MEQ tablet [id number]  Take 1 tablet (10 mEq total) by mouth 2 (two) times daily. Daphine Deutscher, Mary-Margaret, FNP  Active               Assessment/Plan:   Diabetes: - Currently uncontrolled based on last A1c 7.3%, above goal <7%. Josephine Igo report shows improved BG control with TIR 70% and GMI 7.0% - Reviewed long term cardiovascular and renal outcomes of uncontrolled blood sugar - Reviewed goal A1c, goal fasting, and goal 2 hour post prandial glucose - Reviewed dietary modifications including limiting intake of carbohydrates and increasing intake of protein and vegetables - Reviewed lifestyle modifications including: encouraged him to continue walking daily - Recommend to continue Toujeo 70 units daily and Humalog 12-20 units TID with meals - Recommend to check glucose continuously with Libre 3 plus CGM. Provided patient with bandage to assist with adhesion and counseled to purchase online if this helps.     Hypertension: - Currently uncontrolled based on clinic BP 143/100 today, above goal <130/80. In January UACR elevated at 2197. Patient confirmed adherence to lisinopril at home, but unsure if he has 5 mg  or 10 mg tablets. Lisinopril 10 mg tablets were filled most recently 11/2023 for 90 day supply so suspect this is what he has at home. Appropriate to increase lisinopril given significantly elevated UACR and for better BP control. Also discussed that headaches may be due to elevated BP. - Reviewed long term cardiovascular and renal outcomes of uncontrolled blood pressure - Recommend to increase lisinopril to 20 mg daily.  - F/u BMET due in 4 weeks       Hyperlipidemia/ASCVD Risk Reduction: - Currently uncontrolled based on last lipid panel with TG 1005 mg/dL (increased from 161 mg/dL) and LDL unable to be calculated. - Reviewed long term complications of uncontrolled  cholesterol - Recently restarted on fenofibrate, Vascepa, and atorvastatin 80 mg daily and will continue - Recommend to check direct LDL to assess CV risk - F/u lipid panel due in 1-2 months   Follow Up Plan: PharmD on 03/05/2024 and PCP on 03/22/2024  Jarrett Ables, PharmD PGY-1 Pharmacy Resident  Kieth Brightly, PharmD, BCACP, CPP Clinical Pharmacist, [facility]

## 2024-02-23 ENCOUNTER — Telehealth: Payer: Self-pay | Admitting: Pharmacist

## 2024-02-23 ENCOUNTER — Other Ambulatory Visit (HOSPITAL_COMMUNITY): Payer: Self-pay

## 2024-02-23 ENCOUNTER — Telehealth: Payer: Self-pay

## 2024-02-23 DIAGNOSIS — E108 Type 1 diabetes mellitus with unspecified complications: Secondary | ICD-10-CM

## 2024-02-23 MED ORDER — FREESTYLE LIBRE 3 PLUS SENSOR MISC: 6 refills | Status: AC

## 2024-02-23 NOTE — Telephone Encounter (Signed)
 Pharmacy Patient Advocate Encounter   Received notification from Onbase that prior authorization for FreeStyle Libre 2 Sensor is required/requested.   Insurance verification completed.   The patient is insured through [facility] .   Per test claim: PA required; PA submitted to above mentioned insurance via CoverMyMeds Key/confirmation #/EOC [id number] Status is pending

## 2024-02-23 NOTE — Telephone Encounter (Signed)
 PA submitted for FSL3+ Patient no longer on the FSL2 CGM system

## 2024-03-05 ENCOUNTER — Ambulatory Visit: Payer: Medicaid Other

## 2024-03-05 DIAGNOSIS — I10 Essential (primary) hypertension: Secondary | ICD-10-CM

## 2024-03-05 DIAGNOSIS — Z794 Long term (current) use of insulin: Secondary | ICD-10-CM

## 2024-03-05 DIAGNOSIS — E108 Type 1 diabetes mellitus with unspecified complications: Secondary | ICD-10-CM | POA: Diagnosis not present

## 2024-03-05 NOTE — Progress Notes (Unsigned)
 03/05/2024 Name: Stephen Richardson MRN: [id number] DOB: 1996/01/30  Chief Complaint  Patient presents with   Diabetes   Hypertension    Stephen Richardson is a 28 y.o. year old male who was referred for medication management by their primary care provider, Bennie Pierini, FNP. They presented for a face to face visit today.   They were referred to the pharmacist by their PCP for assistance in managing diabetes, hypertension, and hyperlipidemia    Subjective:  Care Team: Primary Care Provider: Bennie Pierini, FNP   Medication Access/Adherence  Current Pharmacy:  [phone] - [place], [place] - [address] [place] [address] Phone: [phone] Fax: [phone]   Patient reports affordability concerns with their medications: No  Patient reports access/transportation concerns to their pharmacy: No  Patient reports adherence concerns with their medications:  Yes     Diabete:  Current medications: Toujeo 70 units daily, Humalog 12-20 units TID with meals (injects 20 units if BG >200 and 12 units if ~100) Past meds: metformin   Has had some issues with CGM adhesion, applied FSL3+ sample today with skin tac/circular overpatch  Has not worn sensor in 2 weeks  POC BG today was 115 Hasn't had libre on for 14 days  Past trends Date of Download: 02/06/2024 - Libre 3 plus % Time CGM is active: 76% Average Glucose: 155 mg/dL Glucose Management Indicator: 7.0  Glucose Variability: 32.9 (goal <36%) Time in Goal:  - Time in range 70-180: 70% - Time above range: 29% - Time below range: 1% Observed patterns: mostly in target range with some post prandial hyperglycemia    Hypertension BP 143-145/83-88 Discussed with provider Patient reports dizziness/blurred vision--PCP Recommended eye exam (goes to [facility]) & close follow up States dizziness is coming from vascepa? Requested patient monitor this  Current medications:  lisinopril 20 mg daily   Patient is not checking BP at home. Reports blurred vision and dizziness (but has always had)   Patient denies hypotensive s/sx including dizziness, lightheadedness.  Patient denies hypertensive symptoms including headache, chest pain, shortness of breath.  Current physical activity: continues to walk; working but unsure of job description  Current medication access support: medicaid   Objective:  Lab Results  Component Value Date   HGBA1C 7.3 (H) 01/02/2024    Lab Results  Component Value Date   CREATININE 0.48 (L) 01/02/2024   BUN 15 01/02/2024   NA 134 01/02/2024   K 4.0 01/02/2024   CL 95 (L) 01/02/2024   CO2 24 01/02/2024    Lab Results  Component Value Date   CHOL 320 (H) 01/02/2024   HDL 32 (L) 01/02/2024   LDLCALC Comment (A) 01/02/2024   LDLDIRECT 106 (H) 01/04/2021   TRIG 1,005 (HH) 01/02/2024   CHOLHDL 10.0 (H) 01/02/2024    Medications Reviewed Today     Reviewed by Danella Maiers, [facility] (Pharmacist) on 03/07/24 at 1608  Med List Status: <None>   Medication Order Taking? Sig Documenting Provider Last Dose Status Informant  Accu-Chek FastClix Lancets MISC [id number] No Test BS QID Dx E10.8 Bennie Pierini, FNP Taking Active   atorvastatin (LIPITOR) 80 MG tablet [id number]  Take 1 tablet (80 mg total) by mouth daily. Bennie Pierini, FNP  Active   B-D UF III MINI PEN NEEDLES 31G X 5 MM MISC [id number] No USE WITH INSULIN DAILY DX E10.8 Bennie Pierini, FNP Taking Active   Blood Glucose Monitoring Suppl (ACCU-CHEK GUIDE) w/Device KIT [id number] No Use to test  blood sugars 4 times daily as directed Dx E10.9 Bennie Pierini, FNP Taking Active   Continuous Glucose Sensor (FREESTYLE LIBRE 3 PLUS SENSOR) MISC [id number]  Change sensor every 15 days. Cancel Libre 2 RX. DX: E10.9 Daphine Deutscher, Mary-Margaret, FNP  Active   fenofibrate (TRICOR) 145 MG tablet [id number]  Take 1 tablet (145 mg total) by mouth daily. Daphine Deutscher,  Mary-Margaret, FNP  Active   glucose blood (ACCU-CHEK GUIDE) test strip [id number] No DX: E10.89 Bennie Pierini, FNP Taking Active   icosapent Ethyl (VASCEPA) 1 g capsule [id number]  Take 2 capsules (2 g total) by mouth 2 (two) times daily. Daphine Deutscher, Mary-Margaret, FNP  Active   insulin glargine, 2 Unit Dial, (TOUJEO MAX SOLOSTAR) 300 UNIT/ML Solostar Pen [id number] No Inject 70 Units into the skin at bedtime. Daphine Deutscher Mary-Margaret, FNP Taking Active   insulin lispro (HUMALOG KWIKPEN) 100 UNIT/ML KwikPen [id number] No Inject 12-20 Units into the skin 3 (three) times daily with meals. Per sliding scale Bennie Pierini, FNP Taking Active   lisdexamfetamine (VYVANSE) 50 MG capsule [id number]  Take 1 capsule (50 mg total) by mouth daily. Daphine Deutscher, Mary-Margaret, FNP  Expired 02/01/24 2359   lisdexamfetamine (VYVANSE) 50 MG capsule [id number]  Take 1 capsule (50 mg total) by mouth daily. Daphine Deutscher Mary-Margaret, FNP  Expired 03/02/24 2359   lisdexamfetamine (VYVANSE) 50 MG capsule [id number]  Take 1 capsule (50 mg total) by mouth daily. Daphine Deutscher, Mary-Margaret, FNP  Active   lisinopril (ZESTRIL) 20 MG tablet [id number]  Take 1 tablet (20 mg total) by mouth daily. Daphine Deutscher, Mary-Margaret, FNP  Active   potassium chloride (KLOR-CON) 10 MEQ tablet [id number]  Take 1 tablet (10 mEq total) by mouth 2 (two) times daily. Daphine Deutscher Mary-Margaret, FNP  Active              Assessment/Plan:  Has had some issues with CGM adhesion, applied FSL3+ sample today with skin tac/circular overpatch (back of left arm) Continue current regimen  Will adjust BP and DM meds pending BP checks and FSL3 sensor data  Will leave BP cuff up front for patient to pick up next week when shipment arrives  Follow Up Plan: 2-3 weeks with pharmd  Kieth Brightly, PharmD, BCACP, CPP Clinical Pharmacist, [facility]

## 2024-03-07 NOTE — Telephone Encounter (Signed)
 Pt has changed to [employer] 3 PLUS, RPH has received approval for FSL3+. Signing off on encounter.

## 2024-03-22 ENCOUNTER — Other Ambulatory Visit

## 2024-03-22 ENCOUNTER — Ambulatory Visit: Payer: Medicaid Other | Admitting: Nurse Practitioner

## 2024-03-28 ENCOUNTER — Other Ambulatory Visit

## 2024-04-01 ENCOUNTER — Ambulatory Visit: Admitting: Nurse Practitioner

## 2024-04-01 ENCOUNTER — Encounter: Payer: Self-pay | Admitting: Nurse Practitioner

## 2024-04-01 VITALS — BP 148/90 | HR 75 | Temp 98.2°F | Ht 66.0 in | Wt 242.0 lb

## 2024-04-01 DIAGNOSIS — E1069 Type 1 diabetes mellitus with other specified complication: Secondary | ICD-10-CM | POA: Diagnosis not present

## 2024-04-01 DIAGNOSIS — R6250 Unspecified lack of expected normal physiological development in childhood: Secondary | ICD-10-CM

## 2024-04-01 DIAGNOSIS — Q2381 Bicuspid aortic valve: Secondary | ICD-10-CM | POA: Diagnosis not present

## 2024-04-01 DIAGNOSIS — E785 Hyperlipidemia, unspecified: Secondary | ICD-10-CM

## 2024-04-01 DIAGNOSIS — E108 Type 1 diabetes mellitus with unspecified complications: Secondary | ICD-10-CM | POA: Diagnosis not present

## 2024-04-01 DIAGNOSIS — Z6837 Body mass index (BMI) 37.0-37.9, adult: Secondary | ICD-10-CM | POA: Diagnosis not present

## 2024-04-01 DIAGNOSIS — E876 Hypokalemia: Secondary | ICD-10-CM | POA: Diagnosis not present

## 2024-04-01 DIAGNOSIS — Q231 Congenital insufficiency of aortic valve: Secondary | ICD-10-CM | POA: Diagnosis not present

## 2024-04-01 DIAGNOSIS — F900 Attention-deficit hyperactivity disorder, predominantly inattentive type: Secondary | ICD-10-CM | POA: Diagnosis not present

## 2024-04-01 DIAGNOSIS — E1169 Type 2 diabetes mellitus with other specified complication: Secondary | ICD-10-CM

## 2024-04-01 DIAGNOSIS — I10 Essential (primary) hypertension: Secondary | ICD-10-CM | POA: Diagnosis not present

## 2024-04-01 LAB — LIPID PANEL

## 2024-04-01 LAB — BAYER DCA HB A1C WAIVED: HB A1C (BAYER DCA - WAIVED): 6.5 % — ABNORMAL HIGH (ref 4.8–5.6)

## 2024-04-01 MED ORDER — TOUJEO MAX SOLOSTAR 300 UNIT/ML ~~LOC~~ SOPN: 70.0000 [IU] | PEN_INJECTOR | Freq: Every day | SUBCUTANEOUS | 5 refills | Status: DC

## 2024-04-01 MED ORDER — ICOSAPENT ETHYL 1 G PO CAPS: 2.0000 g | ORAL_CAPSULE | Freq: Two times a day (BID) | ORAL | 1 refills | Status: DC

## 2024-04-01 MED ORDER — POTASSIUM CHLORIDE ER 10 MEQ PO TBCR: 10.0000 meq | EXTENDED_RELEASE_TABLET | Freq: Two times a day (BID) | ORAL | 1 refills | Status: DC

## 2024-04-01 MED ORDER — ATORVASTATIN CALCIUM 80 MG PO TABS: 80.0000 mg | ORAL_TABLET | Freq: Every day | ORAL | 1 refills | Status: DC

## 2024-04-01 MED ORDER — LISDEXAMFETAMINE DIMESYLATE 50 MG PO CAPS: 50.0000 mg | ORAL_CAPSULE | Freq: Every day | ORAL | 0 refills | Status: DC

## 2024-04-01 MED ORDER — LISINOPRIL 20 MG PO TABS: 20.0000 mg | ORAL_TABLET | Freq: Every day | ORAL | 1 refills | Status: DC

## 2024-04-01 MED ORDER — FENOFIBRATE 145 MG PO TABS: 145.0000 mg | ORAL_TABLET | Freq: Every day | ORAL | 1 refills | Status: DC

## 2024-04-01 MED ORDER — INSULIN LISPRO (1 UNIT DIAL) 100 UNIT/ML (KWIKPEN): 12.0000 [IU] | PEN_INJECTOR | Freq: Three times a day (TID) | SUBCUTANEOUS | 11 refills | Status: DC

## 2024-04-01 NOTE — Progress Notes (Addendum)
 Subjective:    Patient ID: Stephen Richardson, male    DOB: [birth year], 28 y.o.   MRN: [id number]   Chief Complaint: medical management of chronic issues     HPI:  Stephen Richardson is a 4 y.o. who identifies as a male who was assigned male at birth.   Social history: Lives with: mom Work history: works for a [employer] in today for follow up of the following chronic medical issues:  1. Essential hypertension No c/o chest pain, sob or headache. Does not check blood pressure at home. BP Readings from Last 3 Encounters:  02/06/24 (!) 143/100  01/02/24 (!) 148/92  11/07/23 137/84     2. Congenital aortic valve insufficiency 3. Bicuspid aortic valve Is suppose to be in a research study for his heart. He thinks he still is! He has not seen the heart doctor lately  4. Hyperlipidemia associated with type 2 diabetes mellitus (HCC) Does not watch diet and does very little exercise. Lab Results  Component Value Date   CHOL 320 (H) 01/02/2024   HDL 32 (L) 01/02/2024   LDLCALC Comment (A) 01/02/2024   LDLDIRECT 106 (H) 01/04/2021   TRIG 1,005 (HH) 01/02/2024   CHOLHDL 10.0 (H) 01/02/2024     5. Type 1 diabetes mellitus with complications (HCC) Fasting blood sugars are running around 78-250. He has stopped his metformin  for now. Lab Results  Component Value Date   HGBA1C 7.3 (H) 01/02/2024     6. Attention deficit hyperactivity disorder (ADHD), predominantly inattentive type Is on vyvanse  daily so he can concentrate. No medication side effects.  7. Hypokalemia No c/o muscle cramps Lab Results  Component Value Date   K 4.0 01/02/2024     8. BMI 38.0-38.9, adult Weight is down 3 lbs  Wt Readings from Last 3 Encounters:  04/01/24 242 lb (109.8 kg)  01/02/24 245 lb (111.1 kg)  10/02/23 236 lb (107 kg)   BMI Readings from Last 3 Encounters:  04/01/24 39.06 kg/m  01/02/24 39.54 kg/m  10/02/23 38.09 kg/m          New  complaints: None today  No Known Allergies Outpatient Encounter Medications as of 04/01/2024  Medication Sig   Accu-Chek FastClix Lancets MISC Test BS QID Dx E10.8   atorvastatin  (LIPITOR) 80 MG tablet Take 1 tablet (80 mg total) by mouth daily.   B-D UF III MINI PEN NEEDLES 31G X 5 MM MISC USE WITH INSULIN  DAILY DX E10.8   Blood Glucose Monitoring Suppl (ACCU-CHEK GUIDE) w/Device KIT Use to test blood sugars 4 times daily as directed Dx E10.9   Continuous Glucose Sensor (FREESTYLE LIBRE 3 PLUS SENSOR) MISC Change sensor every 15 days. Cancel Libre 2 RX. DX: E10.9   fenofibrate  (TRICOR ) 145 MG tablet Take 1 tablet (145 mg total) by mouth daily.   glucose blood (ACCU-CHEK GUIDE) test strip DX: E10.89   icosapent  Ethyl (VASCEPA ) 1 g capsule Take 2 capsules (2 g total) by mouth 2 (two) times daily.   insulin  glargine, 2 Unit Dial , (TOUJEO  MAX SOLOSTAR) 300 UNIT/ML Solostar Pen Inject 70 Units into the skin at bedtime.   insulin  lispro (HUMALOG  KWIKPEN) 100 UNIT/ML KwikPen Inject 12-20 Units into the skin 3 (three) times daily with meals. Per sliding scale   lisdexamfetamine (VYVANSE ) 50 MG capsule Take 1 capsule (50 mg total) by mouth daily.   lisdexamfetamine (VYVANSE ) 50 MG capsule Take 1 capsule (50 mg total) by mouth daily.   lisdexamfetamine (VYVANSE )  50 MG capsule Take 1 capsule (50 mg total) by mouth daily.   lisinopril  (ZESTRIL ) 20 MG tablet Take 1 tablet (20 mg total) by mouth daily.   potassium chloride  (KLOR-CON ) 10 MEQ tablet Take 1 tablet (10 mEq total) by mouth 2 (two) times daily.   No facility-administered encounter medications on file as of 04/01/2024.    No past surgical history on file.  Family History  Problem Relation Age of Onset   Kidney disease Father    Diabetes Father    Pancreatitis Father       Controlled substance contract: 10/02/23     Review of Systems  Constitutional:  Negative for diaphoresis.  Eyes:  Negative for pain.  Respiratory:  Negative  for shortness of breath.   Cardiovascular:  Negative for chest pain, palpitations and leg swelling.  Gastrointestinal:  Negative for abdominal pain.  Endocrine: Negative for polydipsia.  Skin:  Negative for rash.  Neurological:  Negative for dizziness, weakness and headaches.  Hematological:  Does not bruise/bleed easily.  All other systems reviewed and are negative.      Objective:   Physical Exam Vitals and nursing note reviewed.  Constitutional:      Appearance: Normal appearance. He is well-developed.  HENT:     Head: Normocephalic.     Nose: Nose normal.     Mouth/Throat:     Mouth: Mucous membranes are moist.     Pharynx: Oropharynx is clear.  Eyes:     Pupils: Pupils are equal, round, and reactive to light.  Neck:     Thyroid: No thyroid mass or thyromegaly.     Vascular: No carotid bruit or JVD.     Trachea: Phonation normal.  Cardiovascular:     Rate and Rhythm: Normal rate and regular rhythm.  Pulmonary:     Effort: Pulmonary effort is normal. No respiratory distress.     Breath sounds: Normal breath sounds.  Abdominal:     General: Bowel sounds are normal.     Palpations: Abdomen is soft.     Tenderness: There is no abdominal tenderness.  Musculoskeletal:        General: Normal range of motion.     Cervical back: Normal range of motion and neck supple.  Lymphadenopathy:     Cervical: No cervical adenopathy.  Skin:    General: Skin is warm and dry.  Neurological:     Mental Status: He is alert and oriented to person, place, and time.  Psychiatric:        Behavior: Behavior normal.        Thought Content: Thought content normal.        Judgment: Judgment normal.    BP (!) 148/90   Pulse 75   Temp 98.2 F (36.8 C) (Temporal)   Ht 5\' 6"  (1.676 m)   Wt 242 lb (109.8 kg)   SpO2 96%   BMI 39.06 kg/m    HJGBA1c 6.5%       Assessment & Plan:   Stephen Richardson comes in today with chief complaint of medical management of chronic issues     Diagnosis and orders addressed:  1. Essential hypertension Low sodium diet - CBC with Differential/Platelet - CMP14+EGFR  2. Congenital aortic valve insufficiency 3. Bicuspid aortic valve Keep follow up with cardiology  4. Hyperlipidemia associated with type 2 diabetes mellitus (HCC) Low fat diet - Lipid panel  5. Type 1 diabetes mellitus with complications (HCC) Continue to watch carbs in diet - [facility]  Hb A1c Waived - Microalbumin / creatinine urine ratio  6. Attention deficit hyperactivity disorder (ADHD), predominantly inattentive type - Drug Screen 10 W/Conf, Se - lisdexamfetamine (VYVANSE ) 50 MG capsule; Take 1 capsule (50 mg total) by mouth daily.  Dispense: 30 capsule; Refill: 0 - lisdexamfetamine (VYVANSE ) 50 MG capsule; Take 1 capsule (50 mg total) by mouth daily.  Dispense: 30 capsule; Refill: 0 - lisdexamfetamine (VYVANSE ) 50 MG capsule; Take 1 capsule (50 mg total) by mouth daily.  Dispense: 30 capsule; Refill: 0  7. Hypokalemia Labs pending  8. BMI 37.0-37.9, adult Discussed diet and exercise for person with BMI >25 Will recheck weight in 3-6 months    Labs pending Health Maintenance reviewed Diet and exercise encouraged  Follow up plan: 3 months   Mary-Margaret Gaylyn Keas, FNP

## 2024-04-01 NOTE — Patient Instructions (Signed)

## 2024-04-02 LAB — CMP14+EGFR
ALT: 7 IU/L (ref 0–44)
AST: 13 IU/L (ref 0–40)
Albumin: 4.3 g/dL (ref 4.3–5.2)
Alkaline Phosphatase: 74 IU/L (ref 44–121)
BUN/Creatinine Ratio: 25 — ABNORMAL HIGH (ref 9–20)
BUN: 14 mg/dL (ref 6–20)
Bilirubin Total: 0.2 mg/dL (ref 0.0–1.2)
CO2: 18 mmol/L — ABNORMAL LOW (ref 20–29)
Calcium: 9.7 mg/dL (ref 8.7–10.2)
Chloride: 104 mmol/L (ref 96–106)
Creatinine, Ser: 0.56 mg/dL — ABNORMAL LOW (ref 0.76–1.27)
Globulin, Total: 2.2 g/dL (ref 1.5–4.5)
Glucose: 88 mg/dL (ref 70–99)
Potassium: 4.5 mmol/L (ref 3.5–5.2)
Sodium: 141 mmol/L (ref 134–144)
Total Protein: 6.5 g/dL (ref 6.0–8.5)
eGFR: 139 mL/min/{1.73_m2} (ref >59)

## 2024-04-02 LAB — CBC WITH DIFFERENTIAL/PLATELET
Basophils Absolute: 0.1 10*3/uL (ref 0.0–0.2)
Basos: 1 %
EOS (ABSOLUTE): 0.1 10*3/uL (ref 0.0–0.4)
Eos: 2 %
Hematocrit: 44 % (ref 37.5–51.0)
Hemoglobin: 14.9 g/dL (ref 13.0–17.7)
Immature Grans (Abs): 0 10*3/uL (ref 0.0–0.1)
Immature Granulocytes: 0 %
Lymphocytes Absolute: 2.6 10*3/uL (ref 0.7–3.1)
Lymphs: 35 %
MCH: 30.2 pg (ref 26.6–33.0)
MCHC: 33.9 g/dL (ref 31.5–35.7)
MCV: 89 fL (ref 79–97)
Monocytes Absolute: 0.4 10*3/uL (ref 0.1–0.9)
Monocytes: 6 %
Neutrophils Absolute: 4.2 10*3/uL (ref 1.4–7.0)
Neutrophils: 56 %
Platelets: 314 10*3/uL (ref 150–450)
RBC: 4.94 x10E6/uL (ref 4.14–5.80)
RDW: 12.9 % (ref 11.6–15.4)
WBC: 7.5 10*3/uL (ref 3.4–10.8)

## 2024-04-02 LAB — LIPID PANEL
Cholesterol, Total: 242 mg/dL — ABNORMAL HIGH (ref 100–199)
HDL: 32 mg/dL — ABNORMAL LOW (ref >39)
LDL CALC COMMENT:: 7.6 ratio — ABNORMAL HIGH (ref 0.0–5.0)
LDL Chol Calc (NIH): 87 mg/dL (ref 0–99)
Triglycerides: 745 mg/dL (ref 0–149)
VLDL Cholesterol Cal: 123 mg/dL — ABNORMAL HIGH (ref 5–40)

## 2024-04-05 ENCOUNTER — Other Ambulatory Visit: Payer: Self-pay | Admitting: Nurse Practitioner

## 2024-04-15 ENCOUNTER — Ambulatory Visit: Admitting: Cardiology

## 2024-06-28 ENCOUNTER — Ambulatory Visit: Admitting: Nurse Practitioner

## 2024-06-28 ENCOUNTER — Encounter: Payer: Self-pay | Admitting: Nurse Practitioner

## 2024-06-28 VITALS — BP 148/86 | HR 93 | Temp 98.1°F | Ht 66.0 in | Wt 239.0 lb

## 2024-06-28 DIAGNOSIS — E108 Type 1 diabetes mellitus with unspecified complications: Secondary | ICD-10-CM | POA: Diagnosis not present

## 2024-06-28 DIAGNOSIS — Z0000 Encounter for general adult medical examination without abnormal findings: Secondary | ICD-10-CM

## 2024-06-28 DIAGNOSIS — E785 Hyperlipidemia, unspecified: Secondary | ICD-10-CM | POA: Diagnosis not present

## 2024-06-28 DIAGNOSIS — Q231 Congenital insufficiency of aortic valve: Secondary | ICD-10-CM | POA: Diagnosis not present

## 2024-06-28 DIAGNOSIS — I10 Essential (primary) hypertension: Secondary | ICD-10-CM | POA: Diagnosis not present

## 2024-06-28 DIAGNOSIS — E1169 Type 2 diabetes mellitus with other specified complication: Secondary | ICD-10-CM | POA: Diagnosis not present

## 2024-06-28 DIAGNOSIS — E876 Hypokalemia: Secondary | ICD-10-CM

## 2024-06-28 DIAGNOSIS — F900 Attention-deficit hyperactivity disorder, predominantly inattentive type: Secondary | ICD-10-CM

## 2024-06-28 DIAGNOSIS — Z0001 Encounter for general adult medical examination with abnormal findings: Secondary | ICD-10-CM

## 2024-06-28 DIAGNOSIS — E1069 Type 1 diabetes mellitus with other specified complication: Secondary | ICD-10-CM | POA: Diagnosis not present

## 2024-06-28 DIAGNOSIS — Q2381 Bicuspid aortic valve: Secondary | ICD-10-CM

## 2024-06-28 DIAGNOSIS — Z6837 Body mass index (BMI) 37.0-37.9, adult: Secondary | ICD-10-CM

## 2024-06-28 LAB — BAYER DCA HB A1C WAIVED: HB A1C (BAYER DCA - WAIVED): 5.8 % — ABNORMAL HIGH (ref 4.8–5.6)

## 2024-06-28 LAB — CBC WITH DIFFERENTIAL/PLATELET

## 2024-06-28 MED ORDER — LISDEXAMFETAMINE DIMESYLATE 50 MG PO CAPS: 50.0000 mg | ORAL_CAPSULE | Freq: Every day | ORAL | 0 refills | Status: DC

## 2024-06-28 MED ORDER — LISINOPRIL 20 MG PO TABS: 20.0000 mg | ORAL_TABLET | Freq: Every day | ORAL | 1 refills | Status: DC

## 2024-06-28 MED ORDER — FENOFIBRATE 145 MG PO TABS: 145.0000 mg | ORAL_TABLET | Freq: Every day | ORAL | 1 refills | Status: DC

## 2024-06-28 MED ORDER — POTASSIUM CHLORIDE ER 10 MEQ PO TBCR: 10.0000 meq | EXTENDED_RELEASE_TABLET | Freq: Two times a day (BID) | ORAL | 1 refills | Status: DC

## 2024-06-28 MED ORDER — ICOSAPENT ETHYL 1 G PO CAPS: 2.0000 g | ORAL_CAPSULE | Freq: Two times a day (BID) | ORAL | 1 refills | Status: DC

## 2024-06-28 MED ORDER — ATORVASTATIN CALCIUM 80 MG PO TABS: 80.0000 mg | ORAL_TABLET | Freq: Every day | ORAL | 1 refills | Status: DC

## 2024-06-28 MED ORDER — LISDEXAMFETAMINE DIMESYLATE 50 MG PO CAPS: 50.0000 mg | ORAL_CAPSULE | Freq: Every day | ORAL | 0 refills | Status: AC

## 2024-06-28 NOTE — Progress Notes (Signed)
 Subjective:    Patient ID: Stephen Richardson, male    DOB: [birth year], 28 y.o.   MRN: [id number]   Chief Complaint: annual physical    HPI:  Stephen Richardson is a 28 y.o. who identifies as a male who was assigned male at birth.   Social history: Lives with: mom Work history: works for a [employer] in today for follow up of the following chronic medical issues:  1. Essential hypertension No c/o chest pain, sob or headache. Does not check blood pressure at home. BP Readings from Last 3 Encounters:  04/01/24 (!) 148/90  02/06/24 (!) 143/100  01/02/24 (!) 148/92     2. Congenital aortic valve insufficiency 3. Bicuspid aortic valve Is suppose to be in a research study for his heart. He thinks he still is! He has not seen the heart doctor lately  4. Hyperlipidemia associated with type 2 diabetes mellitus (HCC) Does not watch diet and does very little exercise. Lab Results  Component Value Date   CHOL 242 (H) 04/01/2024   HDL 32 (L) 04/01/2024   LDLCALC 87 04/01/2024   LDLDIRECT 106 (H) 01/04/2021   TRIG 745 (HH) 04/01/2024   CHOLHDL 7.6 (H) 04/01/2024     5. Type 1 diabetes mellitus with complications (HCC) Fasting blood sugars are running around 80-250. Averaging 120. e has stopped his metformin  for now. Lab Results  Component Value Date   HGBA1C 6.5 (H) 04/01/2024     6. Attention deficit hyperactivity disorder (ADHD), predominantly inattentive type Is on vyvanse  daily so he can concentrate. No medication side effects.  7. Hypokalemia No c/o muscle cramps Lab Results  Component Value Date   K 4.5 04/01/2024     8. BMI 38.0-38.9, adult Weight is down 3 lbs  Wt Readings from Last 3 Encounters:  06/28/24 239 lb (108.4 kg)  04/01/24 242 lb (109.8 kg)  01/02/24 245 lb (111.1 kg)   BMI Readings from Last 3 Encounters:  06/28/24 38.58 kg/m  04/01/24 39.06 kg/m  01/02/24 39.54 kg/m            New complaints: None  today  No Known Allergies Outpatient Encounter Medications as of 06/28/2024  Medication Sig   Accu-Chek FastClix Lancets MISC Test BS QID Dx E10.8   atorvastatin  (LIPITOR) 80 MG tablet Take 1 tablet (80 mg total) by mouth daily.   B-D UF III MINI PEN NEEDLES 31G X 5 MM MISC USE WITH INSULIN  DAILY DX E10.8   Blood Glucose Monitoring Suppl (ACCU-CHEK GUIDE) w/Device KIT Use to test blood sugars 4 times daily as directed Dx E10.9   Continuous Glucose Sensor (FREESTYLE LIBRE 3 PLUS SENSOR) MISC Change sensor every 15 days. Cancel Libre 2 RX. DX: E10.9   fenofibrate  (TRICOR ) 145 MG tablet Take 1 tablet (145 mg total) by mouth daily.   glucose blood (ACCU-CHEK GUIDE) test strip DX: E10.89   icosapent  Ethyl (VASCEPA ) 1 g capsule Take 2 capsules (2 g total) by mouth 2 (two) times daily.   insulin  glargine, 2 Unit Dial , (TOUJEO  MAX SOLOSTAR) 300 UNIT/ML Solostar Pen Inject 70 Units into the skin at bedtime.   insulin  lispro (HUMALOG  KWIKPEN) 100 UNIT/ML KwikPen Inject 12-20 Units into the skin 3 (three) times daily with meals. Per sliding scale   lisdexamfetamine (VYVANSE ) 50 MG capsule Take 1 capsule (50 mg total) by mouth daily.   lisdexamfetamine (VYVANSE ) 50 MG capsule Take 1 capsule (50 mg total) by mouth daily.   lisdexamfetamine (VYVANSE )  50 MG capsule Take 1 capsule (50 mg total) by mouth daily.   lisinopril  (ZESTRIL ) 20 MG tablet Take 1 tablet (20 mg total) by mouth daily.   potassium chloride  (KLOR-CON ) 10 MEQ tablet Take 1 tablet (10 mEq total) by mouth 2 (two) times daily.   No facility-administered encounter medications on file as of 06/28/2024.    No past surgical history on file.  Family History  Problem Relation Age of Onset   Kidney disease Father    Diabetes Father    Pancreatitis Father       Controlled substance contract: 10/02/23     Review of Systems  Constitutional:  Negative for diaphoresis.  Eyes:  Negative for pain.  Respiratory:  Negative for shortness of  breath.   Cardiovascular:  Negative for chest pain, palpitations and leg swelling.  Gastrointestinal:  Negative for abdominal pain.  Endocrine: Negative for polydipsia.  Skin:  Negative for rash.  Neurological:  Negative for dizziness, weakness and headaches.  Hematological:  Does not bruise/bleed easily.  All other systems reviewed and are negative.      Objective:   Physical Exam Vitals and nursing note reviewed.  Constitutional:      Appearance: Normal appearance. He is well-developed.  HENT:     Head: Normocephalic.     Nose: Nose normal.     Mouth/Throat:     Mouth: Mucous membranes are moist.     Pharynx: Oropharynx is clear.  Eyes:     Pupils: Pupils are equal, round, and reactive to light.  Neck:     Thyroid: No thyroid mass or thyromegaly.     Vascular: No carotid bruit or JVD.     Trachea: Phonation normal.  Cardiovascular:     Rate and Rhythm: Normal rate and regular rhythm.  Pulmonary:     Effort: Pulmonary effort is normal. No respiratory distress.     Breath sounds: Normal breath sounds.  Abdominal:     General: Bowel sounds are normal.     Palpations: Abdomen is soft.     Tenderness: There is no abdominal tenderness.  Musculoskeletal:        General: Normal range of motion.     Cervical back: Normal range of motion and neck supple.  Lymphadenopathy:     Cervical: No cervical adenopathy.  Skin:    General: Skin is warm and dry.  Neurological:     Mental Status: He is alert and oriented to person, place, and time.  Psychiatric:        Behavior: Behavior normal.        Thought Content: Thought content normal.        Judgment: Judgment normal.    BP (!) 148/86   Pulse 93   Temp 98.1 F (36.7 C) (Temporal)   Ht 5' 6 (1.676 m)   Wt 239 lb (108.4 kg)   SpO2 96%   BMI 38.58 kg/m     HJGBA1c 5.8%       Assessment & Plan:   Stephen Richardson comes in today with chief complaint of annual phsical  Diagnosis and orders addressed:  1. Essential  hypertension Low sodium diet - CBC with Differential/Platelet - CMP14+EGFR  2. Congenital aortic valve insufficiency 3. Bicuspid aortic valve Keep follow up with cardiology  4. Hyperlipidemia associated with type 2 diabetes mellitus (HCC) Low fat diet - Lipid panel  5. Type 1 diabetes mellitus with complications (HCC) Continue to watch carbs in diet - Bayer [facility] Hb A1c Waived -  Microalbumin / creatinine urine ratio  6. Attention deficit hyperactivity disorder (ADHD), predominantly inattentive type - Drug Screen 10 W/Conf, Se - lisdexamfetamine (VYVANSE ) 50 MG capsule; Take 1 capsule (50 mg total) by mouth daily.  Dispense: 30 capsule; Refill: 0 - lisdexamfetamine (VYVANSE ) 50 MG capsule; Take 1 capsule (50 mg total) by mouth daily.  Dispense: 30 capsule; Refill: 0 - lisdexamfetamine (VYVANSE ) 50 MG capsule; Take 1 capsule (50 mg total) by mouth daily.  Dispense: 30 capsule; Refill: 0  7. Hypokalemia Labs pending  8. BMI 37.0-37.9, adult Discussed diet and exercise for person with BMI >25 Will recheck weight in 3-6 months    Labs pending Health Maintenance reviewed Diet and exercise encouraged  Follow up plan: 3 months   Mary-Margaret Gladis, FNP

## 2024-06-29 LAB — CBC WITH DIFFERENTIAL/PLATELET
Basos: 1
EOS (ABSOLUTE): 0.1 x10E3/uL (ref 0.0–0.2)
Eos: 2
Hematocrit: 44.6 (ref 37.5–51.0)
Hemoglobin: 15.1 g/dL (ref 13.0–17.7)
Immature Granulocytes: 0
Immature Granulocytes: 0 x10E3/uL (ref 0.0–0.1)
Lymphs: 39
MCH: 30.1 pg (ref 26.6–33.0)
MCHC: 33.9 g/dL (ref 31.5–35.7)
MCV: 89 fL (ref 79–97)
Monocytes Absolute: 0.1 x10E3/uL (ref 0.0–0.4)
Monocytes Absolute: 0.5 x10E3/uL (ref 0.1–0.9)
Monocytes: 7
Neutrophils Absolute: 3 x10E3/uL (ref 0.7–3.1)
Neutrophils Absolute: 4 x10E3/uL (ref 1.4–7.0)
Neutrophils: 51
Platelets: 338 x10E3/uL (ref 150–450)
RBC: 5.02 x10E6/uL (ref 4.14–5.80)
RDW: 13.1 (ref 11.6–15.4)
WBC: 7.8 x10E3/uL (ref 3.4–10.8)

## 2024-06-29 LAB — CMP14+EGFR
ALT: 7 IU/L (ref 0–44)
AST: 20 IU/L (ref 0–40)
Albumin: 4.3 g/dL (ref 4.3–5.2)
Alkaline Phosphatase: 68 IU/L (ref 44–121)
BUN/Creatinine Ratio: 31 — ABNORMAL HIGH (ref 9–20)
BUN: 18 mg/dL (ref 6–20)
Bilirubin Total: 0.4 mg/dL (ref 0.0–1.2)
CO2: 21 mmol/L (ref 20–29)
Calcium: 9.7 mg/dL (ref 8.7–10.2)
Chloride: 102 mmol/L (ref 96–106)
Creatinine, Ser: 0.58 mg/dL — ABNORMAL LOW (ref 0.76–1.27)
Globulin, Total: 2.4 g/dL (ref 1.5–4.5)
Glucose: 62 mg/dL — ABNORMAL LOW (ref 70–99)
Potassium: 4.3 mmol/L (ref 3.5–5.2)
Sodium: 137 mmol/L (ref 134–144)
Total Protein: 6.7 g/dL (ref 6.0–8.5)
eGFR: 138 mL/min/1.73 (ref >59)

## 2024-06-29 LAB — LIPID PANEL
Chol/HDL Ratio: 8 ratio — ABNORMAL HIGH (ref 0.0–5.0)
Cholesterol, Total: 248 mg/dL — ABNORMAL HIGH (ref 100–199)
HDL: 31 mg/dL — ABNORMAL LOW (ref >39)
LDL Chol Calc (NIH): 123 mg/dL — ABNORMAL HIGH (ref 0–99)
Triglycerides: 524 mg/dL — ABNORMAL HIGH (ref 0–149)
VLDL Cholesterol Cal: 94 mg/dL — ABNORMAL HIGH (ref 5–40)

## 2024-07-08 ENCOUNTER — Ambulatory Visit: Payer: Self-pay | Admitting: Nurse Practitioner

## 2024-07-19 NOTE — Progress Notes (Signed)
 Cardiology Office Note:   Date:  07/31/2024  ID:  Stephen Richardson, DOB 27-May-1996, MRN [id number] PCP:  Gladis Mustard, FNP  [facility] Providers Cardiologist:  Wendel Haws, MD Referring MD: Gladis, Mary-Margaret, *  Chief Complaint/Reason for Referral: Congenital bicuspid aortic valve, aortic insufficiency ASSESSMENT:    1. Bicuspid aortic valve   2. Type 1 diabetes mellitus without complication (HCC)   3. Hyperlipidemia due to type 1 diabetes mellitus (HCC)   4. Hypertension associated with type 1 diabetes mellitus (HCC)   5. Hypertriglyceridemia   6. BMI 38.0-38.9,adult     PLAN:   In order of problems listed above: Bicuspid aortic valve: Last echocardiogram actually demonstrated no bicuspid valve and the patient has a tricuspid aortic valve.   T1DM: Start aspirin 81 mg, continue lisinopril  20 mg, atorvastatin  80 mg, defer SGLT2 inhibitors. Hyperlipidemia: Continue atorvastatin  80 mg, start Zetia  10 mg, LDL above goal recently.  Check lipid panel, LFTs, LP(a) in 2 months. Hypertension: Continue lisinopril  20 mg; BP well controlled today. Hypertriglyceridemia: Continue Vascepa  2 g twice daily, Tricor  140 mg daily; refer to Dr. Mona for further recommendations. Elevated BMI: Not a candidate for GLP-1 receptor agonist therapy given type 1 diabetes.           Dispo:  Return in about 6 months (around 01/31/2025).      I spent 42 minutes reviewing all clinical data during and prior to this visit including all relevant imaging studies, laboratories, clinical information from other health systems and prior notes from both Cardiology and other specialties, interviewing the patient, conducting a complete physical examination, and coordinating care in order to formulate a comprehensive and personalized evaluation and treatment plan.   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 The patient is a 28 year old male with the above listed medical problems referred for recommendations regarding congenital bicuspid valve and aortic insufficiency.  The patient had previously had an echocardiogram done in 2016 which suggested a right/noncoronary cusp effusion consistent with a bicuspid valve.  In 2021 the patient had echocardiogram which demonstrated a tricuspid valve.  He had last been seen by cardiology in 2021 by Dr. Vernice who had referred him to Dr. Mona for triglyceride management.  He was screened for the CORE trial ([place]) and it does not look like he ended up participating.  He was ultimately started on Vascepa  in addition to fenofibrate .  His most recent lipid panel demonstrated a triglyceride of 524 and LDL of 123  He has a history of type 1 diabetes, diagnosed at the age of 27, and is currently on insulin  therapy.  No symptoms of chest pain, shortness of breath, leg swelling, or orthopnea. He monitors his blood pressure at home, which ranges from 150 to 200 mmHg, indicating elevated levels.  His LDL cholesterol was measured at 123 mg/dL in July, which is above the target level for a diabetic patient. He is currently taking Lipitor (atorvastatin ) for cholesterol management.  He does not smoke and engages in activities such as walking and playing games. He also has a side job involving cutting down trees.     Current Medications: Current Meds  Medication Sig   Accu-Chek FastClix Lancets MISC Test BS QID Dx E10.8   atorvastatin  (LIPITOR) 80 MG tablet Take 1 tablet (80 mg  total) by mouth daily.   B-D UF III MINI PEN NEEDLES 31G X 5 MM MISC USE WITH INSULIN  DAILY DX E10.8   Blood Glucose Monitoring Suppl (ACCU-CHEK GUIDE) w/Device KIT Use to test blood sugars 4 times daily as directed Dx E10.9   Continuous Glucose  Sensor (FREESTYLE LIBRE 3 PLUS SENSOR) MISC Change sensor every 15 days. Cancel Libre 2 RX. DX: E10.9   ezetimibe  (ZETIA ) 10 MG tablet Take 1 tablet (10 mg total) by mouth daily.   fenofibrate  (TRICOR ) 145 MG tablet Take 1 tablet (145 mg total) by mouth daily.   glucose blood (ACCU-CHEK GUIDE) test strip DX: E10.89   icosapent  Ethyl (VASCEPA ) 1 g capsule Take 2 capsules (2 g total) by mouth 2 (two) times daily.   insulin  glargine, 2 Unit Dial , (TOUJEO  MAX SOLOSTAR) 300 UNIT/ML Solostar Pen Inject 70 Units into the skin at bedtime.   insulin  lispro (HUMALOG  KWIKPEN) 100 UNIT/ML KwikPen Inject 12-20 Units into the skin 3 (three) times daily with meals. Per sliding scale   lisdexamfetamine (VYVANSE ) 50 MG capsule Take 1 capsule (50 mg total) by mouth daily.   lisinopril  (ZESTRIL ) 20 MG tablet Take 1 tablet (20 mg total) by mouth daily.   potassium chloride  (KLOR-CON ) 10 MEQ tablet Take 1 tablet (10 mEq total) by mouth 2 (two) times daily.     Review of Systems:   Please see the history of present illness.    All other systems reviewed and are negative.     EKGs/Labs/Other Test Reviewed:   EKG: 2021 normal sinus rhythm  EKG Interpretation Date/Time:    Ventricular Rate:    PR Interval:    QRS Duration:    QT Interval:    QTC Calculation:   R Axis:      Text Interpretation:          CARDIAC STUDIES: Refer to CV Procedures and Imaging Tabs   Risk Assessment/Calculations:          Physical Exam:   VS:  BP 128/70   Pulse 64   Ht 5' 8 (1.727 m)   Wt 231 lb 12.8 oz (105.1 kg)   SpO2 95%   BMI 35.25 kg/m        Wt Readings from Last 3 Encounters:  07/31/24 231 lb 12.8 oz (105.1 kg)  06/28/24 239 lb (108.4 kg)  04/01/24 242 lb (109.8 kg)      GENERAL:  No apparent distress, AOx3 HEENT:  No carotid bruits, +2 carotid impulses, no scleral icterus CAR: RRR no murmurs, gallops, rubs, or thrills RES:  Clear to auscultation bilaterally ABD:  Soft, nontender, nondistended,  positive bowel sounds x 4 VASC:  +2 radial pulses, +2 carotid pulses NEURO:  CN 2-12 grossly intact; motor and sensory grossly intact PSYCH:  No active depression or anxiety EXT:  No edema, ecchymosis, or cyanosis  Signed, Avarey Yaeger K Sagan Maselli, MD  07/31/2024 11:55 AM    [facility] [address] [place], [place], [place]  [address] Phone: [phone]; Fax: [phone]   Note:  This document was prepared using Dragon voice recognition software and may include unintentional dictation errors.

## 2024-07-24 ENCOUNTER — Other Ambulatory Visit (HOSPITAL_COMMUNITY): Payer: Self-pay

## 2024-07-25 ENCOUNTER — Other Ambulatory Visit (HOSPITAL_COMMUNITY): Payer: Self-pay

## 2024-07-31 ENCOUNTER — Other Ambulatory Visit: Payer: Self-pay | Admitting: *Deleted

## 2024-07-31 ENCOUNTER — Encounter: Payer: Self-pay | Admitting: Internal Medicine

## 2024-07-31 ENCOUNTER — Ambulatory Visit: Attending: Cardiology | Admitting: Internal Medicine

## 2024-07-31 VITALS — BP 128/70 | HR 64 | Ht 68.0 in | Wt 231.8 lb

## 2024-07-31 DIAGNOSIS — E109 Type 1 diabetes mellitus without complications: Secondary | ICD-10-CM | POA: Diagnosis not present

## 2024-07-31 DIAGNOSIS — Q2381 Bicuspid aortic valve: Secondary | ICD-10-CM | POA: Insufficient documentation

## 2024-07-31 DIAGNOSIS — E1059 Type 1 diabetes mellitus with other circulatory complications: Secondary | ICD-10-CM | POA: Diagnosis not present

## 2024-07-31 DIAGNOSIS — E1069 Type 1 diabetes mellitus with other specified complication: Secondary | ICD-10-CM

## 2024-07-31 DIAGNOSIS — I152 Hypertension secondary to endocrine disorders: Secondary | ICD-10-CM | POA: Insufficient documentation

## 2024-07-31 DIAGNOSIS — E781 Pure hyperglyceridemia: Secondary | ICD-10-CM | POA: Diagnosis not present

## 2024-07-31 DIAGNOSIS — Z6838 Body mass index (BMI) 38.0-38.9, adult: Secondary | ICD-10-CM | POA: Insufficient documentation

## 2024-07-31 DIAGNOSIS — E785 Hyperlipidemia, unspecified: Secondary | ICD-10-CM | POA: Insufficient documentation

## 2024-07-31 MED ORDER — EZETIMIBE 10 MG PO TABS: 10.0000 mg | ORAL_TABLET | Freq: Every day | ORAL | 6 refills | Status: DC

## 2024-07-31 NOTE — Patient Instructions (Addendum)
 Medication Instructions:  Your physician has recommended you make the following change in your medication:  START Zetia  10 mg daily  *If you need a refill on your cardiac medications before your next appointment, please call your pharmacy*  Lab Work: Today:  Lipid profile, LipoproteinA, LFTs  If you have any lab test that is abnormal or we need to change your treatment, we will call you to review the results.  Testing/Procedures: None ordered  Follow-Up: At [facility], you and your health needs are our priority.  As part of our continuing mission to provide you with exceptional heart care, our providers are all part of one team.  This team includes your primary Cardiologist (physician) and Advanced Practice Providers or APPs (Physician Assistants and Nurse Practitioners) who all work together to provide you with the care you need, when you need it.   You have been referred to Dr. Mona for hyperlipidemia   Your next appointment:   6 month(s)  Provider:   Dr. Wendel    Thank you for choosing [facility]!!   [phone]

## 2024-08-01 ENCOUNTER — Ambulatory Visit: Payer: Self-pay | Admitting: Internal Medicine

## 2024-08-01 DIAGNOSIS — E1069 Type 1 diabetes mellitus with other specified complication: Secondary | ICD-10-CM

## 2024-08-01 LAB — HEPATIC FUNCTION PANEL
ALT: 9 IU/L (ref 0–44)
AST: 18 IU/L (ref 0–40)
Albumin: 4.4 g/dL (ref 4.3–5.2)
Alkaline Phosphatase: 71 IU/L (ref 44–121)
Bilirubin Total: 0.4 mg/dL (ref 0.0–1.2)
Bilirubin, Direct: 0.13 mg/dL (ref 0.00–0.40)
Total Protein: 7 g/dL (ref 6.0–8.5)

## 2024-08-01 LAB — LIPID PANEL
Chol/HDL Ratio: 8.3 ratio — ABNORMAL HIGH (ref 0.0–5.0)
Cholesterol, Total: 257 mg/dL — ABNORMAL HIGH (ref 100–199)
HDL: 31 mg/dL — ABNORMAL LOW (ref >39)
LDL Chol Calc (NIH): 104 mg/dL — ABNORMAL HIGH (ref 0–99)
Triglycerides: 705 mg/dL (ref 0–149)
VLDL Cholesterol Cal: 122 mg/dL — ABNORMAL HIGH (ref 5–40)

## 2024-08-01 LAB — LIPOPROTEIN A (LPA): Lipoprotein (a): 198.1 nmol/L — ABNORMAL HIGH (ref <75.0)

## 2024-08-05 ENCOUNTER — Other Ambulatory Visit: Payer: Self-pay | Admitting: *Deleted

## 2024-08-05 DIAGNOSIS — E1069 Type 1 diabetes mellitus with other specified complication: Secondary | ICD-10-CM

## 2024-08-15 ENCOUNTER — Other Ambulatory Visit (HOSPITAL_COMMUNITY): Payer: Self-pay

## 2024-08-15 ENCOUNTER — Telehealth: Payer: Self-pay

## 2024-08-15 NOTE — Telephone Encounter (Signed)
 Pharmacy Patient Advocate Encounter   Received notification from CoverMyMeds that prior authorization for FreeStyle Libre 3 Plus Sensor is due for renewal.   Insurance verification completed.   The patient is insured through [employer].  Action: PA required; PA submitted to above mentioned insurance via Latent Key/confirmation #/EOC [facility] Status is pending

## 2024-08-16 ENCOUNTER — Other Ambulatory Visit (HOSPITAL_COMMUNITY): Payer: Self-pay

## 2024-08-16 NOTE — Telephone Encounter (Signed)
 Pharmacy Patient Advocate Encounter  Received notification from [employer] that Prior Authorization for FreeStyle Libre 3 Plus Sensor  has been APPROVED from 08/15/24 to 08/15/25. Unable to obtain price due to refill too soon rejection, last fill date 08/01/24 next available fill date9/13/25   PA #/Case ID/Reference #: [id number]

## 2024-08-19 ENCOUNTER — Other Ambulatory Visit (HOSPITAL_COMMUNITY): Payer: Self-pay

## 2024-10-04 ENCOUNTER — Ambulatory Visit: Admitting: Internal Medicine

## 2024-10-08 ENCOUNTER — Ambulatory Visit: Admitting: Internal Medicine

## 2024-10-22 ENCOUNTER — Encounter (HOSPITAL_BASED_OUTPATIENT_CLINIC_OR_DEPARTMENT_OTHER): Payer: Self-pay | Admitting: Internal Medicine

## 2024-10-22 ENCOUNTER — Telehealth (HOSPITAL_BASED_OUTPATIENT_CLINIC_OR_DEPARTMENT_OTHER): Payer: Self-pay | Admitting: *Deleted

## 2024-10-22 ENCOUNTER — Ambulatory Visit (HOSPITAL_BASED_OUTPATIENT_CLINIC_OR_DEPARTMENT_OTHER): Admitting: Internal Medicine

## 2024-10-22 VITALS — BP 158/102 | HR 81 | Ht 68.0 in | Wt 239.1 lb

## 2024-10-22 DIAGNOSIS — E785 Hyperlipidemia, unspecified: Secondary | ICD-10-CM | POA: Diagnosis not present

## 2024-10-22 DIAGNOSIS — Z5181 Encounter for therapeutic drug level monitoring: Secondary | ICD-10-CM

## 2024-10-22 DIAGNOSIS — E1069 Type 1 diabetes mellitus with other specified complication: Secondary | ICD-10-CM

## 2024-10-22 DIAGNOSIS — E783 Hyperchylomicronemia: Secondary | ICD-10-CM

## 2024-10-22 DIAGNOSIS — Q2381 Bicuspid aortic valve: Secondary | ICD-10-CM

## 2024-10-22 NOTE — Telephone Encounter (Signed)
 Patient seen in office today, Dr Mona wants to start Repatha  Will forward to PA team to initiate PA

## 2024-10-22 NOTE — Progress Notes (Signed)
 LIPID CLINIC CONSULT NOTE  Chief Complaint:  High triglycerides  Primary Care Physician: Gladis Mustard, FNP  Primary Cardiologist:  None  HPI:  Stephen Richardson is a 28 y.o. male who is being seen today for the evaluation of high triglycerides at the request of Darryle Decent, MD. This is a pleasant 28 year old male kindly referred by Dr. CLEMENTEEN Mulch for evaluation and management of hypertriglyceridemia.  His other medical problems include type 2 diabetes with recently elevated hemoglobin A1c at 8.1, hypertension and moderate obesity.  There is a strong family history of high triglycerides particularly in his father and his aunt, but his father also was a more heavy alcohol user.  Stephen Richardson says that he does not use alcohol.  More recently has been fairly sedentary but started exercising a little more and has lost about 9 pounds.  He is working with an endocrinologist on his blood sugars.  He denies any history of pancreatitis however that is in the family.  He has not been on any steroids or other associated medications recently.  He is not thought to have any coronary disease.  He has been treated on atorvastatin , Vascepa  and fenofibrate  but his triglycerides remain significantly elevated.  Total cholesterol was 249, triglycerides 1047, HDL 26 and LDL cannot be calculated.  Subsequent to that about a month later his triglycerides were even higher at 2784.  Total cholesterol of 542.  These findings are significant and suggestive of hyper chylomicronemia.  I wonder if this could be a genetic familial hyper chylomicronemia syndrome or more likely this represents a severe combined hypertriglyceridemia.  10/22/2024  Stephen Richardson is seen today in follow-up.  I last saw him in 2021, however, he is now referred back by Dr. Wendel.  At that time we discussed the possibility of enrolling him in an APO C3 inhibitor trial however that never happened.  He has had some difficulty controlling his  triglycerides with levels in the thousands in the past.  Recent labs showed total cholesterol 257, triglycerides 705, HDL 31 and LDL 104.  There is some variable compliance with his medications however he is insulin -dependent diabetic and his A1c in July was 5.8% which is really good.  He does have an elevated LP(a) however recently tested to be 198.1 nmol/L.  He was prescribed ezetimibe  to take with his medications however he said he never got the medicine filled.  His current regimen includes atorvastatin  80 mg daily, fenofibrate  145 mg daily and Vascepa  2 g twice daily.  PMHx:  Past Medical History:  Diagnosis Date   ADD (attention deficit disorder with hyperactivity)    Chest pain on exertion    Hyperlipidemia    ODD (oppositional defiant disorder)     History reviewed. No pertinent surgical history.  FAMHx:  Family History  Problem Relation Age of Onset   Kidney disease Father    Diabetes Father    Pancreatitis Father     SOCHx:   reports that he has never smoked. He has never used smokeless tobacco. He reports that he does not drink alcohol and does not use drugs.  ALLERGIES:  Allergies  Allergen Reactions   Metformin  And Related Rash    ROS: Pertinent items noted in HPI and remainder of comprehensive ROS otherwise negative.  HOME MEDS: Current Outpatient Medications on File Prior to Visit  Medication Sig Dispense Refill   Accu-Chek FastClix Lancets MISC Test BS QID Dx E10.8 102 each 11   atorvastatin  (LIPITOR) 80 MG tablet Take  1 tablet (80 mg total) by mouth daily. 90 tablet 1   B-D UF III MINI PEN NEEDLES 31G X 5 MM MISC USE WITH INSULIN  DAILY DX E10.8 100 each 3   Blood Glucose Monitoring Suppl (ACCU-CHEK GUIDE) w/Device KIT Use to test blood sugars 4 times daily as directed Dx E10.9 1 kit 0   Continuous Glucose Sensor (FREESTYLE LIBRE 3 PLUS SENSOR) MISC Change sensor every 15 days. Cancel Libre 2 RX. DX: E10.9 2 each 6   ezetimibe  (ZETIA ) 10 MG tablet Take 1  tablet (10 mg total) by mouth daily. 30 tablet 6   fenofibrate  (TRICOR ) 145 MG tablet Take 1 tablet (145 mg total) by mouth daily. 90 tablet 1   glucose blood (ACCU-CHEK GUIDE) test strip DX: E10.89 100 strip 10   icosapent  Ethyl (VASCEPA ) 1 g capsule Take 2 capsules (2 g total) by mouth 2 (two) times daily. 360 capsule 1   insulin  glargine, 2 Unit Dial , (TOUJEO  MAX SOLOSTAR) 300 UNIT/ML Solostar Pen Inject 70 Units into the skin at bedtime. 18 mL 5   insulin  lispro (HUMALOG  KWIKPEN) 100 UNIT/ML KwikPen Inject 12-20 Units into the skin 3 (three) times daily with meals. Per sliding scale 45 mL 11   lisdexamfetamine (VYVANSE ) 50 MG capsule Take 1 capsule (50 mg total) by mouth daily. 30 capsule 0   lisinopril  (ZESTRIL ) 20 MG tablet Take 1 tablet (20 mg total) by mouth daily. 90 tablet 1   potassium chloride  (KLOR-CON ) 10 MEQ tablet Take 1 tablet (10 mEq total) by mouth 2 (two) times daily. 180 tablet 1   No current facility-administered medications on file prior to visit.    LABS/IMAGING: No results found for this or any previous visit (from the past 48 hours). No results found.  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   Stephen Richardson continues to have suboptimal control of his triglycerides.  I reiterated the importance of low-fat diet and regular physical exercise as well as weight loss.  He is on good therapy with the plan to add ezetimibe  however he never filled the medicine.  Of note he has a high LP(a) and might benefit further reduction of that with the PCSK9 inhibitor.  Unfortunately that would not lower triglycerides however I think it is a good opportunity to consider adding that.  I would recommend starting Repatha for additional LDL lowering.  He should continue his statin, fibrate and Vascepa .  Will plan repeat lipids including NMR and LP(a) in about 3 months.  Thanks again for the kind referral.  Vinie KYM Maxcy, MD, CODY CALLOW, [facility]  [facility] of the Advanced Lipid Disorders &  Cardiovascular Risk Reduction Clinic Diplomate of the American Board of Clinical Lipidology Attending Cardiologist  Direct Dial : [phone]  Fax: [phone]  Website:  [email or web address] Vinie BROCKS Madelaine Whipple 10/22/2024, 1:44 PM

## 2024-10-22 NOTE — Patient Instructions (Addendum)
 Medication Instructions:  START REPATHA ONCE INSURANCE HAS APPROVED   *If you need a refill on your cardiac medications before your next appointment, please call your pharmacy*  Lab Work: Your physician recommends that you return for lab work in: 3 MONTHS    NMR Lipoprofile and Lp(a) ONLY WATER TO DRINK AFTER MIDNIGHT NIGHT BEFORE, NO FOOD MORNING OF   If you have labs (blood work) drawn today and your tests are completely normal, you will receive your results only by: MyChart Message (if you have MyChart) OR A paper copy in the mail If you have any lab test that is abnormal or we need to change your treatment, we will call you to review the results.  Testing/Procedures: NONE  Follow-Up: At [facility], you and your health needs are our priority.  As part of our continuing mission to provide you with exceptional heart care, our providers are all part of one team.  This team includes your primary Cardiologist (physician) and Advanced Practice Providers or APPs (Physician Assistants and Nurse Practitioners) who all work together to provide you with the care you need, when you need it.  Your next appointment:   FOLLOW UP WITH DR WENDEL AS PLANNED   We recommend signing up for the patient portal called MyChart.  Sign up information is provided on this After Visit Summary.  MyChart is used to connect with patients for Virtual Visits (Telemedicine).  Patients are able to view lab/test results, encounter notes, upcoming appointments, etc.  Non-urgent messages can be sent to your provider as well.   To learn more about what you can do with MyChart, go to [email or web address].      Subcutaneous Injection Instructions Using a Prefilled Syringe A subcutaneous injection is a shot of medicine that is given into the layer of fat and tissue between skin and muscle. The injection is given with a single-use syringe that is already filled with medicine. The syringe is called a prefilled  syringe. Read the medicine guide or package insert that came with the syringe. Follow directions from the guide about how to prepare and give the injection. This is important because the directions may be different for each medicine. Use only the syringe, needle, and medicine that your health care provider prescribes. Use each prefilled syringe and needle only one time. Supplies needed: Prefilled syringe with needle. Use the needle length and size (gauge) that your provider or pharmacist gives to you. Alcohol wipes. Gauze. Bandage. A container to put used syringes. This may be a sharps container or a hard plastic container that has a secure lid, such as an empty laundry detergent bottle. How to choose a site for injection Follow instructions from your provider about where to give an injection. Do not inject in the same spot each time. There are five main areas that can be used for injecting. These areas include: Abdomen. Avoid the area that is within 2 inches (5 cm) of your navel (umbilicus). Front of thigh. Upper, outer side of thigh. Upper, outer side of arm. Upper, outer part of butt. How to give an injection using a prefilled syringe  Wash your hands with soap and water. If soap and water are not available, use hand sanitizer. Use an alcohol wipe to clean the site where you will be injecting the needle. Let the site air-dry. Remove the plastic cover from the needle on the syringe. Do not let the needle touch anything. Hold the syringe with the needle pointing up. Check the syringe  for any remaining air bubbles. If there are air bubbles, flick the syringe with your finger until the air bubbles rise to the top. Then, gently push on the plunger until you can see a drop of medicine appear at the tip of the needle. This will clear any remaining air bubbles from the syringe. Hold the syringe in your writing hand like a pencil. Use your other hand to pinch and hold about an inch (2.5 cm) of  skin. Do not directly touch the cleaned part of the skin. Gently but quickly, put the needle straight into the skin. The needle should be at a 90-degree angle to the skin. The needle may need to be injected at a 45-degree angle in thin adults or children who have a small amount of body fat. Follow instructions from your provider about the right size needle and angle you should use for the injection. After the needle is completely inserted into the skin, release the skin that you are pinching. Continue to hold the syringe with your writing hand. Use your thumb or index finger of your writing hand to push the plunger all the way into the syringe to inject the medicine. Pull the needle straight out of the skin. If there is bleeding: Press and hold a piece of gauze over the injection site until bleeding stops. Do not rub the area. Cover the injection site with a bandage, if needed. How to safely throw away the supplies If you are using a syringe that does not have a safety system for shielding the needle after injection: Do not recap the needle. Place the syringe and needle in the disposal container. If your syringe has a safety system for shielding the needle after injection: Firmly push down on the plunger after you complete the injection. The protective sleeve will automatically cover the needle, and you will hear a click. The click means that the needle is safely covered. Follow the disposal regulations for the area where you live. Do not use any syringe or needle more than one time. Contact a health care provider if: You have trouble giving the injection. You think that the injection was not given correctly. You have trouble with any of the supplies. The medicine causes side effects. You get a rash on your skin. A get a fever. The condition that is being treated gets worse. Get help right away if: You get any of these symptoms after the injection is given: Trouble breathing. Chest pain. A  rash over most or all of your body. Swelling of the lips or tongue. Trouble swallowing. These symptoms may be an emergency. Get help right away. Call 911. Do not wait to see if the symptoms will go away. Do not drive yourself to the hospital. This information is not intended to replace advice given to you by your health care provider. Make sure you discuss any questions you have with your health care provider. Document Revised: 09/15/2022 Document Reviewed: 09/15/2022 Elsevier Patient Education  2024 [employer].

## 2024-10-23 ENCOUNTER — Other Ambulatory Visit (HOSPITAL_COMMUNITY): Payer: Self-pay

## 2024-10-23 ENCOUNTER — Telehealth: Payer: Self-pay | Admitting: Pharmacy Technician

## 2024-10-23 NOTE — Telephone Encounter (Signed)
 Pharmacy Patient Advocate Encounter   Received notification from Physician's Office that prior authorization for REPATHA is required/requested.   Insurance verification completed.   The patient is insured through [employer].   Per test claim: PA required; PA started via CoverMyMeds. KEY [id number] . Please see clinical question(s) below that I am not finding the answer to in their chart and advise.    Please advise what is the diagnosis or reason for treatment? 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

## 2024-10-25 NOTE — Telephone Encounter (Signed)
 Mona Vinie BROCKS, MD to Rx Prior Auth Team  Elkins, Jenna M, RN  [facility] Triage    10/25/24  8:06 AM He does not meet any of those criteria - if that is what insurance requires, then he will not likely be approved for Repatha. Recommend starting zetia  10 mg daily instead. Repeat lipid and direct LDL in 3 months.   Dr. Mona  Zetia  is on patients medication list, mychart message sent asking if he is taking

## 2024-10-25 NOTE — Telephone Encounter (Signed)
 PA cancelled- not meeting criteria

## 2024-10-25 NOTE — Telephone Encounter (Signed)
 Per provider, Repatha PA request has been cancelled.

## 2024-11-04 NOTE — Telephone Encounter (Signed)
 Two MyChart messages have been sent about zetia . Patient has read both messages without a reply.   Will close encounter and await his follow up

## 2024-11-11 ENCOUNTER — Other Ambulatory Visit

## 2024-11-11 DIAGNOSIS — Z5181 Encounter for therapeutic drug level monitoring: Secondary | ICD-10-CM | POA: Diagnosis not present

## 2024-11-11 DIAGNOSIS — E785 Hyperlipidemia, unspecified: Secondary | ICD-10-CM | POA: Diagnosis not present

## 2024-11-11 DIAGNOSIS — E781 Pure hyperglyceridemia: Secondary | ICD-10-CM | POA: Diagnosis not present

## 2024-11-11 DIAGNOSIS — E1069 Type 1 diabetes mellitus with other specified complication: Secondary | ICD-10-CM | POA: Diagnosis not present

## 2024-11-12 LAB — NMR, LIPOPROFILE
Cholesterol, Total: 288 mg/dL — ABNORMAL HIGH (ref 100–199)
HDL Particle Number: 27.6 umol/L — ABNORMAL LOW (ref >=30.5)
HDL-C: 25 mg/dL — ABNORMAL LOW (ref >39)
LDL Particle Number: 1483 nmol/L — ABNORMAL HIGH (ref <1000)
LDL Size: 19.6 nm — ABNORMAL LOW (ref >20.5)
LP-IR Score: 95 — ABNORMAL HIGH (ref <=45)
Small LDL Particle Number: 1091 nmol/L — ABNORMAL HIGH (ref <=527)
Triglycerides: 971 mg/dL (ref 0–149)

## 2024-11-12 LAB — LIPOPROTEIN A (LPA): Lipoprotein (a): 210.7 nmol/L — ABNORMAL HIGH (ref <75.0)

## 2024-11-19 ENCOUNTER — Ambulatory Visit: Payer: Self-pay | Admitting: Internal Medicine

## 2024-12-27 ENCOUNTER — Ambulatory Visit: Payer: Self-pay | Admitting: Nurse Practitioner

## 2024-12-27 ENCOUNTER — Encounter: Payer: Self-pay | Admitting: Nurse Practitioner

## 2024-12-27 VITALS — BP 157/109 | HR 84 | Temp 98.6°F | Ht 68.0 in | Wt 239.0 lb

## 2024-12-27 DIAGNOSIS — E876 Hypokalemia: Secondary | ICD-10-CM | POA: Diagnosis not present

## 2024-12-27 DIAGNOSIS — E108 Type 1 diabetes mellitus with unspecified complications: Secondary | ICD-10-CM | POA: Diagnosis not present

## 2024-12-27 DIAGNOSIS — E1169 Type 2 diabetes mellitus with other specified complication: Secondary | ICD-10-CM

## 2024-12-27 DIAGNOSIS — Z6837 Body mass index (BMI) 37.0-37.9, adult: Secondary | ICD-10-CM | POA: Diagnosis not present

## 2024-12-27 DIAGNOSIS — F900 Attention-deficit hyperactivity disorder, predominantly inattentive type: Secondary | ICD-10-CM

## 2024-12-27 DIAGNOSIS — E785 Hyperlipidemia, unspecified: Secondary | ICD-10-CM

## 2024-12-27 DIAGNOSIS — E1069 Type 1 diabetes mellitus with other specified complication: Secondary | ICD-10-CM | POA: Diagnosis not present

## 2024-12-27 DIAGNOSIS — I10 Essential (primary) hypertension: Secondary | ICD-10-CM

## 2024-12-27 DIAGNOSIS — Z794 Long term (current) use of insulin: Secondary | ICD-10-CM | POA: Diagnosis not present

## 2024-12-27 DIAGNOSIS — Q2381 Bicuspid aortic valve: Secondary | ICD-10-CM | POA: Diagnosis not present

## 2024-12-27 DIAGNOSIS — Q231 Congenital insufficiency of aortic valve: Secondary | ICD-10-CM

## 2024-12-27 LAB — CBC WITH DIFFERENTIAL/PLATELET
Basophils Absolute: 0.1 x10E3/uL (ref 0.0–0.2)
Basos: 1 %
EOS (ABSOLUTE): 0.1 x10E3/uL (ref 0.0–0.4)
Eos: 2 %
Hematocrit: 48.5 % (ref 37.5–51.0)
Hemoglobin: 15.7 g/dL (ref 13.0–17.7)
Immature Grans (Abs): 0 x10E3/uL (ref 0.0–0.1)
Immature Granulocytes: 0 %
Lymphocytes Absolute: 2.6 x10E3/uL (ref 0.7–3.1)
Lymphs: 37 %
MCH: 28.9 pg (ref 26.6–33.0)
MCHC: 32.4 g/dL (ref 31.5–35.7)
MCV: 89 fL (ref 79–97)
Monocytes Absolute: 0.5 x10E3/uL (ref 0.1–0.9)
Monocytes: 7 %
Neutrophils Absolute: 3.9 x10E3/uL (ref 1.4–7.0)
Neutrophils: 53 %
Platelets: 336 x10E3/uL (ref 150–450)
RBC: 5.43 x10E6/uL (ref 4.14–5.80)
RDW: 13.2 % (ref 11.6–15.4)
WBC: 7.2 x10E3/uL (ref 3.4–10.8)

## 2024-12-27 LAB — CMP14+EGFR
ALT: 11 IU/L (ref 0–44)
AST: 18 IU/L (ref 0–40)
Albumin: 4.5 g/dL (ref 4.3–5.2)
Alkaline Phosphatase: 74 IU/L (ref 47–123)
BUN/Creatinine Ratio: 22 — ABNORMAL HIGH (ref 9–20)
BUN: 15 mg/dL (ref 6–20)
Bilirubin Total: 0.3 mg/dL (ref 0.0–1.2)
CO2: 20 mmol/L (ref 20–29)
Calcium: 9.8 mg/dL (ref 8.7–10.2)
Chloride: 99 mmol/L (ref 96–106)
Creatinine, Ser: 0.68 mg/dL — ABNORMAL LOW (ref 0.76–1.27)
Globulin, Total: 2.6 g/dL (ref 1.5–4.5)
Glucose: 88 mg/dL (ref 70–99)
Potassium: 3.9 mmol/L (ref 3.5–5.2)
Sodium: 139 mmol/L (ref 134–144)
Total Protein: 7.1 g/dL (ref 6.0–8.5)
eGFR: 131 mL/min/1.73

## 2024-12-27 LAB — LIPID PANEL
Chol/HDL Ratio: 8.7 ratio — ABNORMAL HIGH (ref 0.0–5.0)
Cholesterol, Total: 286 mg/dL — ABNORMAL HIGH (ref 100–199)
HDL: 33 mg/dL — ABNORMAL LOW
LDL Chol Calc (NIH): 121 mg/dL — ABNORMAL HIGH (ref 0–99)
Triglycerides: 722 mg/dL (ref 0–149)
VLDL Cholesterol Cal: 132 mg/dL — ABNORMAL HIGH (ref 5–40)

## 2024-12-27 LAB — BAYER DCA HB A1C WAIVED: HB A1C (BAYER DCA - WAIVED): 7.6 % — ABNORMAL HIGH (ref 4.8–5.6)

## 2024-12-27 MED ORDER — EZETIMIBE 10 MG PO TABS: 10.0000 mg | ORAL_TABLET | Freq: Every day | ORAL | 6 refills | Status: AC

## 2024-12-27 MED ORDER — LISINOPRIL-HYDROCHLOROTHIAZIDE 20-25 MG PO TABS: 1.0000 | ORAL_TABLET | Freq: Every day | ORAL | 1 refills | Status: AC

## 2024-12-27 MED ORDER — FENOFIBRATE 145 MG PO TABS: 145.0000 mg | ORAL_TABLET | Freq: Every day | ORAL | 1 refills | Status: AC

## 2024-12-27 MED ORDER — INSULIN LISPRO (1 UNIT DIAL) 100 UNIT/ML (KWIKPEN): 12.0000 [IU] | PEN_INJECTOR | Freq: Three times a day (TID) | SUBCUTANEOUS | 11 refills | Status: AC

## 2024-12-27 MED ORDER — POTASSIUM CHLORIDE ER 10 MEQ PO TBCR: 10.0000 meq | EXTENDED_RELEASE_TABLET | Freq: Two times a day (BID) | ORAL | 1 refills | Status: AC

## 2024-12-27 MED ORDER — TOUJEO MAX SOLOSTAR 300 UNIT/ML ~~LOC~~ SOPN: 70.0000 [IU] | PEN_INJECTOR | Freq: Every day | SUBCUTANEOUS | 5 refills | Status: AC

## 2024-12-27 MED ORDER — ATORVASTATIN CALCIUM 80 MG PO TABS: 80.0000 mg | ORAL_TABLET | Freq: Every day | ORAL | 1 refills | Status: AC

## 2024-12-27 MED ORDER — ICOSAPENT ETHYL 1 G PO CAPS: 2.0000 g | ORAL_CAPSULE | Freq: Two times a day (BID) | ORAL | 1 refills | Status: AC

## 2024-12-27 NOTE — Patient Instructions (Signed)

## 2024-12-27 NOTE — Progress Notes (Signed)
 "  Subjective:    Patient ID: Stephen Richardson, male    DOB: [birth year], 29 y.o.   MRN: [id number]   Chief Complaint: medical management of chronic issues     HPI:  Stephen Richardson is a 78 y.o. who identifies as a male who was assigned male at birth.   Social history: Lives with: mom Work history: works for a [employer] in today for follow up of the following chronic medical issues:  1. Essential hypertension No c/o chest pain, sob or headache. Does not check blood pressure at home. Has not taken blood pressure meds today. BP Readings from Last 3 Encounters:  10/22/24 (!) 158/102  07/31/24 128/70  06/28/24 (!) 148/86     2. Congenital aortic valve insufficiency 3. Bicuspid aortic valve Is suppose to be in a research study for his heart. He thinks he still is! He has not seen the heart doctor lately  4. Hyperlipidemia associated with type 2 diabetes mellitus (HCC) Does not watch diet and does very little exercise. Lab Results  Component Value Date   CHOL 257 (H) 07/31/2024   HDL 31 (L) 07/31/2024   LDLCALC 104 (H) 07/31/2024   LDLDIRECT 106 (H) 01/04/2021   TRIG 705 (HH) 07/31/2024   CHOLHDL 8.3 (H) 07/31/2024     5. Type 1 diabetes mellitus with complications (HCC) Fasting blood sugars are running around 78-250. He has stopped his metformin  for now. Lab Results  Component Value Date   HGBA1C 5.8 (H) 06/28/2024     6. Attention deficit hyperactivity disorder (ADHD), predominantly inattentive type Is on vyvanse  daily so he can concentrate. No medication side effects.  7. Hypokalemia No c/o muscle cramps Lab Results  Component Value Date   K 4.3 06/28/2024     8. BMI 38.0-38.9, adult Weight is down 3 lbs Wt Readings from Last 3 Encounters:  12/27/24 239 lb (108.4 kg)  10/22/24 239 lb 1.6 oz (108.5 kg)  07/31/24 231 lb 12.8 oz (105.1 kg)   BMI Readings from Last 3 Encounters:  12/27/24 36.34 kg/m  10/22/24 36.36 kg/m  07/31/24  35.25 kg/m       New complaints: None today  Allergies  Allergen Reactions   Metformin  And Related Rash   Outpatient Encounter Medications as of 12/27/2024  Medication Sig   Accu-Chek FastClix Lancets MISC Test BS QID Dx E10.8   atorvastatin  (LIPITOR) 80 MG tablet Take 1 tablet (80 mg total) by mouth daily.   B-D UF III MINI PEN NEEDLES 31G X 5 MM MISC USE WITH INSULIN  DAILY DX E10.8   Blood Glucose Monitoring Suppl (ACCU-CHEK GUIDE) w/Device KIT Use to test blood sugars 4 times daily as directed Dx E10.9   Continuous Glucose Sensor (FREESTYLE LIBRE 3 PLUS SENSOR) MISC Change sensor every 15 days. Cancel Libre 2 RX. DX: E10.9   ezetimibe  (ZETIA ) 10 MG tablet Take 1 tablet (10 mg total) by mouth daily.   fenofibrate  (TRICOR ) 145 MG tablet Take 1 tablet (145 mg total) by mouth daily.   glucose blood (ACCU-CHEK GUIDE) test strip DX: E10.89   icosapent  Ethyl (VASCEPA ) 1 g capsule Take 2 capsules (2 g total) by mouth 2 (two) times daily.   insulin  glargine, 2 Unit Dial , (TOUJEO  MAX SOLOSTAR) 300 UNIT/ML Solostar Pen Inject 70 Units into the skin at bedtime.   insulin  lispro (HUMALOG  KWIKPEN) 100 UNIT/ML KwikPen Inject 12-20 Units into the skin 3 (three) times daily with meals. Per sliding scale   lisdexamfetamine  (  VYVANSE ) 50 MG capsule Take 1 capsule (50 mg total) by mouth daily.   lisinopril  (ZESTRIL ) 20 MG tablet Take 1 tablet (20 mg total) by mouth daily.   potassium chloride  (KLOR-CON ) 10 MEQ tablet Take 1 tablet (10 mEq total) by mouth 2 (two) times daily.   No facility-administered encounter medications on file as of 12/27/2024.    No past surgical history on file.  Family History  Problem Relation Age of Onset   Kidney disease Father    Diabetes Father    Pancreatitis Father       Controlled substance contract: 10/02/23     Review of Systems  Constitutional:  Negative for diaphoresis.  Eyes:  Negative for pain.  Respiratory:  Negative for shortness of breath.    Cardiovascular:  Negative for chest pain, palpitations and leg swelling.  Gastrointestinal:  Negative for abdominal pain.  Endocrine: Negative for polydipsia.  Skin:  Negative for rash.  Neurological:  Negative for dizziness, weakness and headaches.  Hematological:  Does not bruise/bleed easily.  All other systems reviewed and are negative.      Objective:   Physical Exam Vitals and nursing note reviewed.  Constitutional:      Appearance: Normal appearance. He is well-developed.  HENT:     Head: Normocephalic.     Nose: Nose normal.     Mouth/Throat:     Mouth: Mucous membranes are moist.     Pharynx: Oropharynx is clear.  Eyes:     Pupils: Pupils are equal, round, and reactive to light.  Neck:     Thyroid: No thyroid mass or thyromegaly.     Vascular: No carotid bruit or JVD.     Trachea: Phonation normal.  Cardiovascular:     Rate and Rhythm: Normal rate and regular rhythm.  Pulmonary:     Effort: Pulmonary effort is normal. No respiratory distress.     Breath sounds: Normal breath sounds.  Abdominal:     General: Bowel sounds are normal.     Palpations: Abdomen is soft.     Tenderness: There is no abdominal tenderness.  Musculoskeletal:        General: Normal range of motion.     Cervical back: Normal range of motion and neck supple.  Lymphadenopathy:     Cervical: No cervical adenopathy.  Skin:    General: Skin is warm and dry.  Neurological:     Mental Status: He is alert and oriented to person, place, and time.  Psychiatric:        Behavior: Behavior normal.        Thought Content: Thought content normal.        Judgment: Judgment normal.    BP (!) 157/109   Pulse 84   Temp 98.6 F (37 C) (Temporal)   Ht 5' 8 (1.727 m)   Wt 239 lb (108.4 kg)   SpO2 97%   BMI 36.34 kg/m     Hgba1c 7.6%       Assessment & Plan:  Rexton Greulich comes in today with chief complaint of Medical Management of Chronic Issues   Diagnosis and orders  addressed:  1. Essential hypertension (Primary) Changed lisinopril  to lisinopril /hydrochlorothiazide  daily DO NOT FORGET TO TAKE MEDS - CBC with Differential/Platelet - CMP14+EGFR - lisinopril -hydrochlorothiazide  (ZESTORETIC ) 20-25 MG tablet; Take 1 tablet by mouth daily.  Dispense: 90 tablet; Refill: 1  2. Congenital aortic valve insufficiency 3. Bicuspid aortic valve Keep follow up with cardiology  4. Hyperlipidemia associated with type 2  diabetes mellitus (HCC) Low fat diet Continue exercise - Lipid panel - atorvastatin  (LIPITOR) 80 MG tablet; Take 1 tablet (80 mg total) by mouth daily.  Dispense: 90 tablet; Refill: 1 - fenofibrate  (TRICOR ) 145 MG tablet; Take 1 tablet (145 mg total) by mouth daily.  Dispense: 90 tablet; Refill: 1 - icosapent  Ethyl (VASCEPA ) 1 g capsule; Take 2 capsules (2 g total) by mouth 2 (two) times daily.  Dispense: 360 capsule; Refill: 1 - ezetimibe  (ZETIA ) 10 MG tablet; Take 1 tablet (10 mg total) by mouth daily.  Dispense: 30 tablet; Refill: 6  5. Type 1 diabetes mellitus with complications (HCC) Hgba1c has gone up but still acceptable- stricter carb counting - Bayer DCA Hb A1c Waived - Microalbumin / creatinine urine ratio - insulin  glargine, 2 Unit Dial , (TOUJEO  MAX SOLOSTAR) 300 UNIT/ML Solostar Pen; Inject 70 Units into the skin at bedtime.  Dispense: 18 mL; Refill: 5 - insulin  lispro (HUMALOG  KWIKPEN) 100 UNIT/ML KwikPen; Inject 12-20 Units into the skin 3 (three) times daily with meals. Per sliding scale  Dispense: 45 mL; Refill: 11  6. Attention deficit hyperactivity disorder (ADHD), predominantly inattentive type Stress management  7. Hypokalemia Labs pending - potassium chloride  (KLOR-CON ) 10 MEQ tablet; Take 1 tablet (10 mEq total) by mouth 2 (two) times daily.  Dispense: 180 tablet; Refill: 1  8. BMI 37.0-37.9, adult Discussed diet and exercise for person with BMI >25 Will recheck weight in 3-6 months    Labs pending Health  Maintenance reviewed Diet and exercise encouraged  Follow up plan: 3 months   Mary-Margaret Gladis, FNP  "

## 2024-12-28 LAB — MICROALBUMIN / CREATININE URINE RATIO
Creatinine, Urine: 93.2 mg/dL
Microalb/Creat Ratio: 1774 mg/g{creat} — ABNORMAL HIGH (ref 0–29)
Microalbumin, Urine: 1653.3 ug/mL

## 2024-12-30 ENCOUNTER — Ambulatory Visit: Payer: Self-pay | Admitting: Nurse Practitioner

## 2024-12-30 DIAGNOSIS — E1169 Type 2 diabetes mellitus with other specified complication: Secondary | ICD-10-CM

## 2024-12-30 DIAGNOSIS — E108 Type 1 diabetes mellitus with unspecified complications: Secondary | ICD-10-CM

## 2024-12-31 ENCOUNTER — Telehealth: Payer: Self-pay

## 2024-12-31 NOTE — Progress Notes (Signed)
 Care Guide Pharmacy Note  12/31/2024 Name: Ronte Parker MRN: [id number] DOB: Sep 22, 1996  Referred By: Gladis Mustard, FNP Reason for referral: Complex Care Management (Outreach to schedule with Pharm d )   Lorenzo Arscott is a 29 y.o. year old male who is a primary care patient of Gladis Mustard, FNP.  Nay[employer] was referred to the pharmacist for assistance related to: DMII  Successful contact was made with the patient to discuss pharmacy services including being ready for the pharmacist to call at least 5 minutes before the scheduled appointment time and to have medication bottles and any blood pressure readings ready for review. The patient agreed to meet with the pharmacist via telephone visit on (date/time).02/06/2025  Jeoffrey Buffalo , RMA     [place]  [facility], [facility] Guide  Direct Dial : [phone]  Website: [email or web address]

## 2025-01-14 ENCOUNTER — Other Ambulatory Visit: Payer: Self-pay | Admitting: Nurse Practitioner

## 2025-01-21 ENCOUNTER — Ambulatory Visit (HOSPITAL_BASED_OUTPATIENT_CLINIC_OR_DEPARTMENT_OTHER): Admitting: Family

## 2025-02-06 ENCOUNTER — Other Ambulatory Visit

## 2025-03-24 ENCOUNTER — Ambulatory Visit: Admitting: Nurse Practitioner
